# Patient Record
Sex: Male | Born: 1965 | Race: White | Hispanic: No | Marital: Married | Smoking: Never smoker
Health system: Southern US, Community
[De-identification: ages and names within clinical notes are randomized; demographics above are authoritative.]

## PROBLEM LIST (undated history)

## (undated) DIAGNOSIS — I4891 Unspecified atrial fibrillation: Secondary | ICD-10-CM

## (undated) DIAGNOSIS — I10 Essential (primary) hypertension: Secondary | ICD-10-CM

## (undated) DIAGNOSIS — E039 Hypothyroidism, unspecified: Secondary | ICD-10-CM

## (undated) DIAGNOSIS — Z8719 Personal history of other diseases of the digestive system: Secondary | ICD-10-CM

## (undated) DIAGNOSIS — K2900 Acute gastritis without bleeding: Secondary | ICD-10-CM

## (undated) DIAGNOSIS — R0600 Dyspnea, unspecified: Secondary | ICD-10-CM

## (undated) DIAGNOSIS — K921 Melena: Secondary | ICD-10-CM

## (undated) DIAGNOSIS — E079 Disorder of thyroid, unspecified: Secondary | ICD-10-CM

## (undated) DIAGNOSIS — D649 Anemia, unspecified: Secondary | ICD-10-CM

## (undated) DIAGNOSIS — K219 Gastro-esophageal reflux disease without esophagitis: Secondary | ICD-10-CM

## (undated) DIAGNOSIS — Z8489 Family history of other specified conditions: Secondary | ICD-10-CM

## (undated) HISTORY — DX: Acute gastritis without bleeding: K29.00

## (undated) HISTORY — DX: Anemia, unspecified: D64.9

## (undated) HISTORY — DX: Essential (primary) hypertension: I10

## (undated) HISTORY — PX: HERNIA REPAIR: SHX51

## (undated) HISTORY — DX: Gastro-esophageal reflux disease without esophagitis: K21.9

## (undated) HISTORY — DX: Unspecified atrial fibrillation: I48.91

## (undated) HISTORY — DX: Disorder of thyroid, unspecified: E07.9

## (undated) HISTORY — DX: Melena: K92.1

---

## 2018-09-21 ENCOUNTER — Emergency Department: Payer: Self-pay

## 2018-09-21 ENCOUNTER — Inpatient Hospital Stay
Admission: EM | Admit: 2018-09-21 | Discharge: 2018-09-21 | DRG: 310 | Disposition: A | Discharge status: Home / Self Care | Payer: Self-pay | Attending: Internal Medicine | Admitting: Internal Medicine

## 2018-09-21 DIAGNOSIS — I4819 Other persistent atrial fibrillation: Secondary | ICD-10-CM | POA: Diagnosis present

## 2018-09-21 DIAGNOSIS — I499 Cardiac arrhythmia, unspecified: Principal | ICD-10-CM | POA: Diagnosis present

## 2018-09-21 DIAGNOSIS — E039 Hypothyroidism, unspecified: Secondary | ICD-10-CM | POA: Diagnosis present

## 2018-09-21 DIAGNOSIS — R000 Tachycardia, unspecified: Secondary | ICD-10-CM | POA: Diagnosis present

## 2018-09-21 DIAGNOSIS — I4891 Unspecified atrial fibrillation: Secondary | ICD-10-CM | POA: Diagnosis present

## 2018-09-21 DIAGNOSIS — D649 Anemia, unspecified: Secondary | ICD-10-CM | POA: Diagnosis present

## 2018-09-21 DIAGNOSIS — I10 Essential (primary) hypertension: Secondary | ICD-10-CM | POA: Diagnosis present

## 2018-09-21 DIAGNOSIS — Z7982 Long term (current) use of aspirin: Secondary | ICD-10-CM

## 2018-09-21 LAB — HEPATIC FUNCTION PANEL
ALK PHOS: 45 U/L (ref 38–126)
ALT: 26 U/L (ref 0–44)
AST: 32 U/L (ref 15–41)
Albumin: 3.4 g/dL — ABNORMAL LOW (ref 3.5–5.0)
BILIRUBIN DIRECT: 0.3 mg/dL — AB (ref 0.0–0.2)
BILIRUBIN TOTAL: 1.6 mg/dL — AB (ref 0.3–1.2)
Indirect Bilirubin: 1.3 mg/dL — ABNORMAL HIGH (ref 0.3–0.9)
Total Protein: 6.9 g/dL (ref 6.5–8.1)

## 2018-09-21 LAB — CBC
HCT: 34.4 % — ABNORMAL LOW (ref 39.0–52.0)
HEMOGLOBIN: 9 g/dL — AB (ref 13.0–17.0)
MCH: 16.4 pg — AB (ref 26.0–34.0)
MCHC: 26.2 g/dL — AB (ref 30.0–36.0)
MCV: 62.8 fL — ABNORMAL LOW (ref 80.0–100.0)
Platelets: 291 10*3/uL (ref 150–400)
RBC: 5.48 MIL/uL (ref 4.22–5.81)
RDW: 19.8 % — AB (ref 11.5–15.5)
WBC: 7.5 10*3/uL (ref 4.0–10.5)
nRBC: 0 % (ref 0.0–0.2)

## 2018-09-21 LAB — BASIC METABOLIC PANEL
Anion gap: 8 (ref 5–15)
BUN: 8 mg/dL (ref 6–20)
CHLORIDE: 103 mmol/L (ref 98–111)
CO2: 23 mmol/L (ref 22–32)
CREATININE: 0.87 mg/dL (ref 0.61–1.24)
Calcium: 8 mg/dL — ABNORMAL LOW (ref 8.9–10.3)
GFR calc Af Amer: >60 mL/min (ref >60)
GFR calc non Af Amer: >60 mL/min (ref >60)
GLUCOSE: 106 mg/dL — AB (ref 70–99)
Potassium: 3.6 mmol/L (ref 3.5–5.1)
Sodium: 134 mmol/L — ABNORMAL LOW (ref 135–145)

## 2018-09-21 LAB — TROPONIN I: Troponin I: <0.03 ng/mL (ref <0.03)

## 2018-09-21 LAB — TSH: TSH: 3.515 u[IU]/mL (ref 0.350–4.500)

## 2018-09-21 LAB — BRAIN NATRIURETIC PEPTIDE: B NATRIURETIC PEPTIDE 5: 267 pg/mL — AB (ref 0.0–100.0)

## 2018-09-21 MED ORDER — LORAZEPAM 1 MG PO TABS: 1.0000 mg | ORAL_TABLET | Freq: Four times a day (QID) | ORAL | Status: DC | PRN

## 2018-09-21 MED ORDER — ADULT MULTIVITAMIN W/MINERALS CH: 1.0000 | ORAL_TABLET | Freq: Every day | ORAL | Status: DC

## 2018-09-21 MED ORDER — THIAMINE HCL 100 MG/ML IJ SOLN: 100.0000 mg | Freq: Every day | INTRAMUSCULAR | Status: DC

## 2018-09-21 MED ORDER — ASPIRIN EC 81 MG PO TBEC: 81.0000 mg | DELAYED_RELEASE_TABLET | Freq: Every day | ORAL | 2 refills | Status: DC

## 2018-09-21 MED ORDER — METOPROLOL TARTRATE 25 MG PO TABS: 25.0000 mg | ORAL_TABLET | Freq: Two times a day (BID) | ORAL | Status: DC

## 2018-09-21 MED ORDER — ASPIRIN 81 MG PO CHEW
324.0000 mg | CHEWABLE_TABLET | Freq: Once | ORAL | Status: AC
  Administered 2018-09-21: 324 mg via ORAL
  Filled 2018-09-21: qty 4

## 2018-09-21 MED ORDER — VITAMIN B-1 100 MG PO TABS: 100.0000 mg | ORAL_TABLET | Freq: Every day | ORAL | Status: DC

## 2018-09-21 MED ORDER — FOLIC ACID 1 MG PO TABS: 1.0000 mg | ORAL_TABLET | Freq: Every day | ORAL | Status: DC

## 2018-09-21 MED ORDER — LORAZEPAM 2 MG PO TABS: 0.0000 mg | ORAL_TABLET | Freq: Two times a day (BID) | ORAL | Status: DC

## 2018-09-21 MED ORDER — METOPROLOL TARTRATE 5 MG/5ML IV SOLN
5.0000 mg | Freq: Once | INTRAVENOUS | Status: AC
  Administered 2018-09-21: 5 mg via INTRAVENOUS
  Filled 2018-09-21: qty 5

## 2018-09-21 MED ORDER — LORAZEPAM 2 MG/ML IJ SOLN: 1.0000 mg | Freq: Four times a day (QID) | INTRAMUSCULAR | Status: DC | PRN

## 2018-09-21 MED ORDER — LORAZEPAM 2 MG PO TABS: 0.0000 mg | ORAL_TABLET | Freq: Four times a day (QID) | ORAL | Status: DC

## 2018-09-21 NOTE — ED Provider Notes (Signed)
[facility] Emergency Department Provider Note  ____________________________________________   I have reviewed the triage vital signs and the nursing notes. Where available I have reviewed prior notes and, if possible and indicated, outside hospital notes.    HISTORY  Chief Complaint Chest Pain    HPI Dean Anderson is a 52 y.o. male who states he has not seen a doctor in 10 years, he states his baseline blood pressure as far as he knows is around 170, he states that he gets chest pain "sometimes" off and on.  He cannot really attribute any particular activity to it.  He also states that he sometimes has shortness of breath but that is chronic.  He states that this when he became sweaty was having some chest discomfort so he called 911, EMS found him to be in a narrow complex rapid rate, they tried adenosine, as it was narrow complex but irregular, after he broke he was clearly and what appeared to be atrial fibrillation so the given 2.5 of metoprolol brought him in.  Heart rate was initially quite fast.  Patient states that the adenosine did not make him feel better but the metoprolol did.  He states he has no chest pain or shortness of breath at this time.  States he was not having very much chest pain at the time it was just a discomfort.  Patient is some of a limited historian.  Aside from likely undiagnosed hypertension patient has a history of alcohol ingestion, he states he drinks 8 beers a day. No known history of withdrawal  History reviewed. No pertinent past medical history.  There are no active problems to display for this patient.   History reviewed. No pertinent surgical history.  Prior to Admission medications   Not on File    Allergies Patient has no known allergies.  No family history on file.  Social History Social History   Tobacco Use  . Smoking status: Never Smoker  . Smokeless tobacco: Never Used  Substance Use Topics  . Alcohol  use: Yes  . Drug use: Not on file    Review of Systems Constitutional: No fever/chills Eyes: No visual changes. ENT: No sore throat. No stiff neck no neck pain Cardiovascular: See HPI chest pain. Respiratory: See HPI shortness of breath. Gastrointestinal:   no vomiting.  No diarrhea.  No constipation. Genitourinary: Negative for dysuria. Musculoskeletal: Negative lower extremity swelling Skin: Negative for rash. Neurological: Negative for severe headaches, focal weakness or numbness.   ____________________________________________   PHYSICAL EXAM:  VITAL SIGNS: ED Triage Vitals  Enc Vitals Group     BP 09/21/18 1025 (!) 173/105     Pulse Rate 09/21/18 1023 80     Resp 09/21/18 1023 18     Temp 09/21/18 1025 97.8 F (36.6 C)     Temp Source 09/21/18 1025 Oral     SpO2 09/21/18 1025 100 %     Weight 09/21/18 1023 200 lb (90.7 kg)     Height 09/21/18 1023 5\' 8"  (1.727 m)     Head Circumference --      Peak Flow --      Pain Score --      Pain Loc --      Pain Edu? --      Excl. in GC? --     Constitutional: Alert and oriented. Well appearing and in no acute distress. Eyes: Conjunctivae are normal Head: Atraumatic HEENT: No congestion/rhinnorhea. Mucous membranes are moist.  Oropharynx non-erythematous  Neck:   Nontender with no meningismus, no masses, no stridor Cardiovascular: Irregularly irregular rate 110 rhythm. Grossly normal heart sounds.  Good peripheral circulation. Respiratory: Normal respiratory effort.  No retractions. Lungs CTAB. Abdominal: Soft and nontender. No distention. No guarding no rebound Back:  There is no focal tenderness or step off.  there is no midline tenderness there are no lesions noted. there is no CVA tenderness Musculoskeletal: No lower extremity tenderness, no upper extremity tenderness. No joint effusions, no DVT signs strong distal pulses no edema Neurologic:  Normal speech and language. No gross focal neurologic deficits are  appreciated.  Skin:  Skin is warm, dry and intact. No rash noted. Psychiatric: Mood and affect are normal. Speech and behavior are normal.  ____________________________________________   LABS (all labs ordered are listed, but only abnormal results are displayed)  Labs Reviewed  BASIC METABOLIC PANEL - Abnormal; Notable for the following components:      Result Value   Sodium 134 (*)    Glucose, Bld 106 (*)    Calcium 8.0 (*)    All other components within normal limits  CBC - Abnormal; Notable for the following components:   Hemoglobin 9.0 (*)    HCT 34.4 (*)    MCV 62.8 (*)    MCH 16.4 (*)    MCHC 26.2 (*)    RDW 19.8 (*)    All other components within normal limits  BRAIN NATRIURETIC PEPTIDE - Abnormal; Notable for the following components:   B Natriuretic Peptide 267.0 (*)    All other components within normal limits  TROPONIN I  TSH    Pertinent labs  results that were available during my care of the patient were reviewed by me and considered in my medical decision making (see chart for details). ____________________________________________  EKG  I personally interpreted any EKGs ordered by me or triage Atrial fibrillation rate 104, RSR prime configuration no acute ST elevation or depression, T waves noted inferiorly, no old for comparison ____________________________________________  RADIOLOGY  Pertinent labs & imaging results that were available during my care of the patient were reviewed by me and considered in my medical decision making (see chart for details). If possible, patient and/or family made aware of any abnormal findings.  Dg Chest 2 View  Result Date: 09/21/2018 CLINICAL DATA:  Chest pain, diaphoresis, tachycardia. No cardiopulmonary history. EXAM: CHEST - 2 VIEW COMPARISON:  None in PACs FINDINGS: The lungs are adequately inflated. There are coarse linear lung markings in the right middle lobe anteriorly. The cardiac silhouette is top-normal in size.  The pulmonary vascularity is normal. There is an air-fluid level in a hiatal hernia. The mediastinum is normal in width. IMPRESSION: Subsegmental atelectasis or scarring in the right middle lobe. No alveolar pneumonia nor pulmonary edema. Top-normal cardiac size without pulmonary vascular congestion. Moderate-sized hiatal hernia. Electronically Signed   By: David  [place] M.D.   On: 09/21/2018 11:10   ____________________________________________    PROCEDURES  Procedure(s) performed: None  Procedures  Critical Care performed: CRITICAL CARE Performed by: Jeanmarie Plant   Total critical care time: 45 minutes  Critical care time was exclusive of separately billable procedures and treating other patients.  Critical care was necessary to treat or prevent imminent or life-threatening deterioration.  Critical care was time spent personally by me on the following activities: development of treatment plan with patient and/or surrogate as well as nursing, discussions with consultants, evaluation of patient's response to treatment, examination of patient, obtaining history from  patient or surrogate, ordering and performing treatments and interventions, ordering and review of laboratory studies, ordering and review of radiographic studies, pulse oximetry and re-evaluation of patient's condition.   ____________________________________________   INITIAL IMPRESSION / ASSESSMENT AND PLAN / ED COURSE  Pertinent labs & imaging results that were available during my care of the patient were reviewed by me and considered in my medical decision making (see chart for details).  Here with a heart rate that appeared to been in the 180s or higher per EMS, improved after metoprolol.  His first medical sinus notes this is new onset atrial fibrillation although given that he has not seen a doctor in 10 years is quite possible that this is been going off and on for some time.  However he has never felt unstable or  sweating with it as he did this morning and he did have some associated chest discomfort.  Initial work-up is reassuring in terms of blood work, he is somewhat anemic we have no way to compare that but no recent history of GI bleed.  NP is elevated, patient will need an echo, TSH is reassuring, troponin is reassuring.  Patient's heart rate began to creep up to the 120s and 130s again I gave him 5 more of metoprolol we will admit him.    ____________________________________________   FINAL CLINICAL IMPRESSION(S) / ED DIAGNOSES  Final diagnoses:  Atrial fibrillation with RVR (HCC)      This chart was dictated using voice recognition software.  Despite best efforts to proofread,  errors can occur which can change meaning.      Jeanmarie Plant, MD 09/21/18 1215

## 2018-09-21 NOTE — Consult Note (Signed)
[facility] - [place] at [facility]   PATIENT NAME: Dean Anderson    MR#:  [id number]  DATE OF BIRTH:  1966-11-15  DATE OF ADMISSION:  09/21/2018  PRIMARY CARE PHYSICIAN: Patient, No Pcp Per   REQUESTING/REFERRING PHYSICIAN: Dr Alphonzo Lemmings  CHIEF COMPLAINT:      Chief Complaint  Patient presents with  . Chest Pain    HISTORY OF PRESENT ILLNESS:  Dean Anderson  is a 52 y.o. male with no past medical history comes to the emergency room after he felt dizzy and lightheaded and overall not feeling well when he went to work. People from work called the fire department he was put on the monitor and was found to be tachycardic/SVT/a fib. He received two doses of adeno seen by EMTs. Patient's heart rate slowed down however he went into a fib again and minimum of IV metoprolol slowed him down. Blood pressure was elevated when patient presented to the emergency room. Currently during my evaluation patient's heart rate is 72-80 sinus rhythm blood pressure is 148/90 he feels fine. No chest pain or shortness of breath.  Patient states he drinks about 7 to 8 cans of beer every day. No history of DTs or alcohol-related health issues.  Was seen in the emergency room by Dr. Gwen Pounds. PAST MEDICAL HISTORY:  History reviewed. No pertinent past medical history.  PAST SURGICAL HISTOIRY:  History reviewed. No pertinent surgical history.  SOCIAL HISTORY:   Social History       Tobacco Use  . Smoking status: Never Smoker  . Smokeless tobacco: Never Used  Substance Use Topics  . Alcohol use: Yes    FAMILY HISTORY:  No family history on file.  DRUG ALLERGIES:  No Known Allergies  REVIEW OF SYSTEMS:   Review of Systems  Constitutional: Negative for chills, fever and weight loss.  HENT: Negative for ear discharge, ear pain and nosebleeds.   Eyes: Negative for blurred vision, pain and discharge.  Respiratory: Negative for sputum production,  shortness of breath, wheezing and stridor.   Cardiovascular: Negative for chest pain, palpitations, orthopnea and PND.  Gastrointestinal: Negative for abdominal pain, diarrhea, nausea and vomiting.  Genitourinary: Negative for frequency and urgency.  Musculoskeletal: Negative for back pain and joint pain.  Neurological: Negative for sensory change, speech change, focal weakness and weakness.  Psychiatric/Behavioral: Negative for depression and hallucinations. The patient is not nervous/anxious.     CONSTITUTIONAL: No fever, fatigue or weakness.  EYES: No blurred or double vision.  EARS, NOSE, AND THROAT: No tinnitus or ear pain.  RESPIRATORY: No cough, shortness of breath, wheezing or hemoptysis.  CARDIOVASCULAR: No chest pain, orthopnea, edema.  GASTROINTESTINAL: No nausea, vomiting, diarrhea or abdominal pain.  GENITOURINARY: No dysuria, hematuria.  ENDOCRINE: No polyuria, nocturia,  HEMATOLOGY: No anemia, easy bruising or bleeding SKIN: No rash or lesion. MUSCULOSKELETAL: No joint pain or arthritis.   NEUROLOGIC: No tingling, numbness, weakness.  PSYCHIATRY: No anxiety or depression.   MEDICATIONS AT HOME:          Prior to Admission medications   Medication Sig Start Date End Date Taking? Authorizing Provider  aspirin EC 81 MG tablet Take 1 tablet (81 mg total) by mouth daily. 09/21/18 09/21/19  Enedina Finner, MD      VITAL SIGNS:  Blood pressure (!) 150/101, pulse 68, temperature 97.8 F (36.6 C), temperature source Oral, resp. rate 19, height 5\' 8"  (1.727 m), weight 90.7 kg, SpO2 100 %.  PHYSICAL EXAMINATION:  GENERAL:  52  y.o.-year-old patient lying in the bed with no acute distress.  EYES: Pupils equal, round, reactive to light and accommodation. No scleral icterus. Extraocular muscles intact.  HEENT: Head atraumatic, normocephalic. Oropharynx and nasopharynx clear.  NECK:  Supple, no jugular venous distention. No thyroid enlargement, no tenderness.  LUNGS:  Normal breath sounds bilaterally, no wheezing, rales,rhonchi or crepitation. No use of accessory muscles of respiration.  CARDIOVASCULAR: S1, S2 normal. No murmurs, rubs, or gallops.  ABDOMEN: Soft, nontender, nondistended. Bowel sounds present. No organomegaly or mass.  EXTREMITIES: No pedal edema, cyanosis, or clubbing.  NEUROLOGIC: Cranial nerves II through XII are intact. Muscle strength 5/5 in all extremities. Sensation intact. Gait not checked.  PSYCHIATRIC: The patient is alert and oriented x 3.  SKIN: No obvious rash, lesion, or ulcer.   LABORATORY PANEL:   CBC LastLabs     Recent Labs  Lab 09/21/18 1026  WBC 7.5  HGB 9.0*  HCT 34.4*  PLT 291     ------------------------------------------------------------------------------------------------------------------  Chemistries  LastLabs     Recent Labs  Lab 09/21/18 1026  NA 134*  K 3.6  CL 103  CO2 23  GLUCOSE 106*  BUN 8  CREATININE 0.87  CALCIUM 8.0*  AST 32  ALT 26  ALKPHOS 45  BILITOT 1.6*     ------------------------------------------------------------------------------------------------------------------  Cardiac Enzymes LastLabs  Recent Labs  Lab 09/21/18 1026  TROPONINI <0.03     ------------------------------------------------------------------------------------------------------------------  RADIOLOGY:   ImagingResults(Last48hours)  Dg Chest 2 View  Result Date: 09/21/2018 CLINICAL DATA:  Chest pain, diaphoresis, tachycardia. No cardiopulmonary history. EXAM: CHEST - 2 VIEW COMPARISON:  None in PACs FINDINGS: The lungs are adequately inflated. There are coarse linear lung markings in the right middle lobe anteriorly. The cardiac silhouette is top-normal in size. The pulmonary vascularity is normal. There is an air-fluid level in a hiatal hernia. The mediastinum is normal in width. IMPRESSION: Subsegmental atelectasis or scarring in the right middle lobe. No alveolar  pneumonia nor pulmonary edema. Top-normal cardiac size without pulmonary vascular congestion. Moderate-sized hiatal hernia. Electronically Signed   By: David  [place] M.D.   On: 09/21/2018 11:10     EKG:      Orders placed or performed during the hospital encounter of 09/21/18  . ED EKG within 10 minutes  . ED EKG within 10 minutes  . EKG 12-Lead  . EKG 12-Lead  . EKG 12-Lead  . EKG 12-Lead    IMPRESSION AND PLAN:   Yeshua Stryker  is a 52 y.o. male with no past medical history comes to the emergency room after he felt dizzy and lightheaded and overall not feeling well when he went to work. People from work called the fire department he was put on the monitor and was found to be tachycardic/SVT/a fib.   1. cardiac arrhythmia patient presented with SVT/a fib. Received two doses of IV Adenosine and a dose of IV metoprolol -in the 70s -he is sinus rhythm. Blood pressure is much improved. -Will place them on oral metoprolol 25 mg BID and aspirin 81 mg daily -patient was seen by Dr. Gwen Pounds cardiology. Okay from his standpoint to go home. Discussed with patient he is agreeable to it  2. malignant hypertension continue metoprolol  3. DVT prophylaxis patient is ambulatory  Discussed discharge plan with patient. He will follow-up with Dr. Gwen Pounds as outpatient. For the workup from cardiology standpoint including echo will be done as outpatient.  All the records are reviewed and case discussed with Consulting provider. Management plans  discussed with the patient, family and they are in agreement.  CODE STATUS: full  TOTAL TIME TAKING CARE OF THIS PATIENT: 50 minutes.    Enedina Finner M.D on 09/21/2018 at 1:26 PM  Between 7am to 6pm - Pager - [phone]  After 6pm go to [email or web address] - password [email or web address]  [facility]  Office  [phone]  CC: Primary care Physician: Patient, No Pcp Per            Routing History

## 2018-09-21 NOTE — ED Notes (Signed)
Pt reports drinking approx 8 beers a day and his last beer was this morning

## 2018-09-21 NOTE — ED Triage Notes (Addendum)
Pt came to ED via EMS c/o chest pain Starting at 530 this morning. Pt reports it was more of a "gas" feeling. Pt reports was sweating and not feeling right. Upon EMS arrival, pt HR 180. Given 6mg  and 12mg  adenosine with no change. Given 2.5 metoporol. Pt HR 103 on arrival

## 2018-09-21 NOTE — Consult Note (Signed)
[facility] Cardiology Consultation Note  Patient ID: Dean Anderson, MRN: [id number], DOB/AGE: 1966/04/09 52 y.o. Admit date: 09/21/2018   Date of Consult: 09/21/2018 Primary Physician: Patient, No Pcp Per Primary Cardiologist: None  Chief Complaint:  Chief Complaint  Patient presents with  . Chest Pain   Reason for Consult: Atrial fibrillation with rapid ventricular rate  HPI: 52 y.o. male with no apparent significant previous cardiovascular history who has had significant amount of tobacco and alcohol use in the past.  Patient has had new onset of palpitations irregular heartbeat and not feeling well.  When seen in the emergency room he had atrial fibrillation with rapid ventricular rate but no other ST changes.  The patient was receiving appropriate medication management including intravenous beta-blocker and converted to normal sinus rhythm without event.  Currently the patient feels well with no further significant symptoms.  He does have a BNP of 267 and normal troponin anemia with a hemoglobin of 9 and a TSH of 3.5.  All of which could have contributed to some of the atrial fibrillation that he has had.  There is been no evidence of heart attack or heart failure type symptoms.  The patient may be able to have further medication management and further treatment options as an outpatient  History reviewed. No pertinent past medical history.    Surgical History: History reviewed. No pertinent surgical history.   Home Meds: Prior to Admission medications   Medication Sig Start Date End Date Taking? Authorizing Provider  aspirin EC 81 MG tablet Take 1 tablet (81 mg total) by mouth daily. 09/21/18 09/21/19  Enedina Finner, MD    Inpatient Medications:  . metoprolol tartrate  25 mg Oral BID  . thiamine  100 mg Oral Daily     Allergies: No Known Allergies  Social History   Socioeconomic History  . Marital status: Married    Spouse name: Not on file  . Number of children: Not on  file  . Years of education: Not on file  . Highest education level: Not on file  Occupational History  . Not on file  Social Needs  . Financial resource strain: Not on file  . Food insecurity:    Worry: Not on file    Inability: Not on file  . Transportation needs:    Medical: Not on file    Non-medical: Not on file  Tobacco Use  . Smoking status: Never Smoker  . Smokeless tobacco: Never Used  Substance and Sexual Activity  . Alcohol use: Yes  . Drug use: Not on file  . Sexual activity: Not on file  Lifestyle  . Physical activity:    Days per week: Not on file    Minutes per session: Not on file  . Stress: Not on file  Relationships  . Social connections:    Talks on phone: Not on file    Gets together: Not on file    Attends religious service: Not on file    Active member of club or organization: Not on file    Attends meetings of clubs or organizations: Not on file    Relationship status: Not on file  . Intimate partner violence:    Fear of current or ex partner: Not on file    Emotionally abused: Not on file    Physically abused: Not on file    Forced sexual activity: Not on file  Other Topics Concern  . Not on file  Social History Narrative  . Not  on file     No family history on file.   Review of Systems Positive for shortness of breath palpitations Negative for: General:  chills, fever, night sweats or weight changes.  Cardiovascular: PND orthopnea syncope dizziness  Dermatological skin lesions rashes Respiratory: Cough congestion Urologic: Frequent urination urination at night and hematuria Abdominal: negative for nausea, vomiting, diarrhea, bright red blood per rectum, melena, or hematemesis Neurologic: negative for visual changes, and/or hearing changes  All other systems reviewed and are otherwise negative except as noted above.  Labs: Recent Labs    09/21/18 1026  TROPONINI <0.03   Lab Results  Component Value Date   WBC 7.5 09/21/2018    HGB 9.0 (L) 09/21/2018   HCT 34.4 (L) 09/21/2018   MCV 62.8 (L) 09/21/2018   PLT 291 09/21/2018    Recent Labs  Lab 09/21/18 1026  NA 134*  K 3.6  CL 103  CO2 23  BUN 8  CREATININE 0.87  CALCIUM 8.0*  PROT 6.9  BILITOT 1.6*  ALKPHOS 45  ALT 26  AST 32  GLUCOSE 106*   No results found for: CHOL, HDL, LDLCALC, TRIG No results found for: DDIMER  Radiology/Studies:  Dg Chest 2 View  Result Date: 09/21/2018 CLINICAL DATA:  Chest pain, diaphoresis, tachycardia. No cardiopulmonary history. EXAM: CHEST - 2 VIEW COMPARISON:  None in PACs FINDINGS: The lungs are adequately inflated. There are coarse linear lung markings in the right middle lobe anteriorly. The cardiac silhouette is top-normal in size. The pulmonary vascularity is normal. There is an air-fluid level in a hiatal hernia. The mediastinum is normal in width. IMPRESSION: Subsegmental atelectasis or scarring in the right middle lobe. No alveolar pneumonia nor pulmonary edema. Top-normal cardiac size without pulmonary vascular congestion. Moderate-sized hiatal hernia. Electronically Signed   By: David  [place] M.D.   On: 09/21/2018 11:10    EKG: Atrial fibrillation with rapid ventricular rate  Weights: Filed Weights   09/21/18 1023  Weight: 90.7 kg     Physical Exam: Blood pressure (!) 148/90, pulse 74, temperature 97.8 F (36.6 C), temperature source Oral, resp. rate 19, height 5\' 8"  (1.727 m), weight 90.7 kg, SpO2 100 %. Body mass index is 30.41 kg/m. General: Well developed, well nourished, in no acute distress. Head eyes ears nose throat: Normocephalic, atraumatic, sclera non-icteric, no xanthomas, nares are without discharge. No apparent thyromegaly and/or mass  Lungs: Normal respiratory effort.  no wheezes, no rales, no rhonchi.  Heart: RRR with normal S1 S2. no murmur gallop, no rub, PMI is normal size and placement, carotid upstroke normal without bruit, jugular venous pressure is normal Abdomen: Soft,  non-tender, non-distended with normoactive bowel sounds. No hepatomegaly. No rebound/guarding. No obvious abdominal masses. Abdominal aorta is normal size without bruit Extremities: No edema. no cyanosis, no clubbing, no ulcers  Peripheral : 2+ bilateral upper extremity pulses, 2+ bilateral femoral pulses, 2+ bilateral dorsal pedal pulse Neuro: Alert and oriented. No facial asymmetry. No focal deficit. Moves all extremities spontaneously. Musculoskeletal: Normal muscle tone without kyphosis Psych:  Responds to questions appropriately with a normal affect.    Assessment: 52 year old male with tobacco alcohol use with slightly hypothyroid and anemic and having acute onset of atrial fibrillation with rapid ventricular rate without evidence of myocardial infarction or congestive heart failure now converted into normal  Plan: 1.  Continue beta-blocker and use oral beta-blocker for heart rate control and spontaneous maintenance of normal sinus rhythm 2.  No further cardiac work-up or treatment due to no  evidence of myocardial infarction and/or congestive heart failure 3.  Patient was counseled on discontinuation of alcohol and tobacco use 4.  Follow-up for further evaluation and treatment options for above  Signed, Lamar Blinks M.D. [facility] Cardiology 09/21/2018, 2:10 PM

## 2018-09-21 NOTE — Discharge Summary (Deleted)
[facility] [place] [facility]   PATIENT NAME: Bejamin Anderson    MR#:  [id number]  DATE OF BIRTH:  1966-02-07  DATE OF ADMISSION:  09/21/2018  PRIMARY CARE PHYSICIAN: Patient, No Pcp Per   REQUESTING/REFERRING PHYSICIAN: Dr Alphonzo Lemmings  CHIEF COMPLAINT:   Chief Complaint  Patient presents with  . Chest Pain    HISTORY OF PRESENT ILLNESS:  Dean Anderson  is a 52 y.o. male with no past medical history comes to the emergency room after he felt dizzy and lightheaded and overall not feeling well when he went to work. People from work called the fire department he was put on the monitor and was found to be tachycardic/SVT/a fib. He received two doses of adeno seen by EMTs. Patient's heart rate slowed down however he went into a fib again and minimum of IV metoprolol slowed him down. Blood pressure was elevated when patient presented to the emergency room. Currently during my evaluation patient's heart rate is 72-80 sinus rhythm blood pressure is 148/90 he feels fine. No chest pain or shortness of breath.  Patient states he drinks about 7 to 8 cans of beer every day. No history of DTs or alcohol-related health issues.  Was seen in the emergency room by Dr. Gwen Pounds. PAST MEDICAL HISTORY:  History reviewed. No pertinent past medical history.  PAST SURGICAL HISTOIRY:  History reviewed. No pertinent surgical history.  SOCIAL HISTORY:   Social History   Tobacco Use  . Smoking status: Never Smoker  . Smokeless tobacco: Never Used  Substance Use Topics  . Alcohol use: Yes    FAMILY HISTORY:  No family history on file.  DRUG ALLERGIES:  No Known Allergies  REVIEW OF SYSTEMS:   Review of Systems  Constitutional: Negative for chills, fever and weight loss.  HENT: Negative for ear discharge, ear pain and nosebleeds.   Eyes: Negative for blurred vision, pain and discharge.  Respiratory: Negative for sputum production, shortness of breath, wheezing and  stridor.   Cardiovascular: Negative for chest pain, palpitations, orthopnea and PND.  Gastrointestinal: Negative for abdominal pain, diarrhea, nausea and vomiting.  Genitourinary: Negative for frequency and urgency.  Musculoskeletal: Negative for back pain and joint pain.  Neurological: Negative for sensory change, speech change, focal weakness and weakness.  Psychiatric/Behavioral: Negative for depression and hallucinations. The patient is not nervous/anxious.     CONSTITUTIONAL: No fever, fatigue or weakness.  EYES: No blurred or double vision.  EARS, NOSE, AND THROAT: No tinnitus or ear pain.  RESPIRATORY: No cough, shortness of breath, wheezing or hemoptysis.  CARDIOVASCULAR: No chest pain, orthopnea, edema.  GASTROINTESTINAL: No nausea, vomiting, diarrhea or abdominal pain.  GENITOURINARY: No dysuria, hematuria.  ENDOCRINE: No polyuria, nocturia,  HEMATOLOGY: No anemia, easy bruising or bleeding SKIN: No rash or lesion. MUSCULOSKELETAL: No joint pain or arthritis.   NEUROLOGIC: No tingling, numbness, weakness.  PSYCHIATRY: No anxiety or depression.   MEDICATIONS AT HOME:   Prior to Admission medications   Medication Sig Start Date End Date Taking? Authorizing Provider  aspirin EC 81 MG tablet Take 1 tablet (81 mg total) by mouth daily. 09/21/18 09/21/19  Enedina Finner, MD      VITAL SIGNS:  Blood pressure (!) 150/101, pulse 68, temperature 97.8 F (36.6 C), temperature source Oral, resp. rate 19, height 5\' 8"  (1.727 m), weight 90.7 kg, SpO2 100 %.  PHYSICAL EXAMINATION:  GENERAL:  52 y.o.-year-old patient lying in the bed with no acute distress.  EYES: Pupils equal,  round, reactive to light and accommodation. No scleral icterus. Extraocular muscles intact.  HEENT: Head atraumatic, normocephalic. Oropharynx and nasopharynx clear.  NECK:  Supple, no jugular venous distention. No thyroid enlargement, no tenderness.  LUNGS: Normal breath sounds bilaterally, no wheezing,  rales,rhonchi or crepitation. No use of accessory muscles of respiration.  CARDIOVASCULAR: S1, S2 normal. No murmurs, rubs, or gallops.  ABDOMEN: Soft, nontender, nondistended. Bowel sounds present. No organomegaly or mass.  EXTREMITIES: No pedal edema, cyanosis, or clubbing.  NEUROLOGIC: Cranial nerves II through XII are intact. Muscle strength 5/5 in all extremities. Sensation intact. Gait not checked.  PSYCHIATRIC: The patient is alert and oriented x 3.  SKIN: No obvious rash, lesion, or ulcer.   LABORATORY PANEL:   CBC Recent Labs  Lab 09/21/18 1026  WBC 7.5  HGB 9.0*  HCT 34.4*  PLT 291   ------------------------------------------------------------------------------------------------------------------  Chemistries  Recent Labs  Lab 09/21/18 1026  NA 134*  K 3.6  CL 103  CO2 23  GLUCOSE 106*  BUN 8  CREATININE 0.87  CALCIUM 8.0*  AST 32  ALT 26  ALKPHOS 45  BILITOT 1.6*   ------------------------------------------------------------------------------------------------------------------  Cardiac Enzymes Recent Labs  Lab 09/21/18 1026  TROPONINI <0.03   ------------------------------------------------------------------------------------------------------------------  RADIOLOGY:  Dg Chest 2 View  Result Date: 09/21/2018 CLINICAL DATA:  Chest pain, diaphoresis, tachycardia. No cardiopulmonary history. EXAM: CHEST - 2 VIEW COMPARISON:  None in PACs FINDINGS: The lungs are adequately inflated. There are coarse linear lung markings in the right middle lobe anteriorly. The cardiac silhouette is top-normal in size. The pulmonary vascularity is normal. There is an air-fluid level in a hiatal hernia. The mediastinum is normal in width. IMPRESSION: Subsegmental atelectasis or scarring in the right middle lobe. No alveolar pneumonia nor pulmonary edema. Top-normal cardiac size without pulmonary vascular congestion. Moderate-sized hiatal hernia. Electronically Signed   By:  David  [place] M.D.   On: 09/21/2018 11:10    EKG:   Orders placed or performed during the hospital encounter of 09/21/18  . ED EKG within 10 minutes  . ED EKG within 10 minutes  . EKG 12-Lead  . EKG 12-Lead  . EKG 12-Lead  . EKG 12-Lead    IMPRESSION AND PLAN:   Ola Fawver  is a 52 y.o. male with no past medical history comes to the emergency room after he felt dizzy and lightheaded and overall not feeling well when he went to work. People from work called the fire department he was put on the monitor and was found to be tachycardic/SVT/a fib.   1. cardiac arrhythmia patient presented with SVT/a fib. Received two doses of IV Adenosine and a dose of IV metoprolol -in the 70s -he is sinus rhythm. Blood pressure is much improved. -Will place them on oral metoprolol 25 mg BID and aspirin 81 mg daily -patient was seen by Dr. Gwen Pounds cardiology. Okay from his standpoint to go home. Discussed with patient he is agreeable to it  2. malignant hypertension continue metoprolol  3. DVT prophylaxis patient is ambulatory  Discussed discharge plan with patient. He will follow-up with Dr. Gwen Pounds as outpatient. For the workup from cardiology standpoint including echo will be done as outpatient.  All the records are reviewed and case discussed with Consulting provider. Management plans discussed with the patient, family and they are in agreement.  CODE STATUS: full  TOTAL TIME TAKING CARE OF THIS PATIENT: 50 minutes.    Enedina Finner M.D on 09/21/2018 at 1:26 PM  Between 7am to  6pm - Pager - [phone]  After 6pm go to [email or web address] - password [email or web address]  [facility]  Office  [phone]  CC: Primary care Physician: Patient, No Pcp Per

## 2018-09-21 NOTE — Discharge Instructions (Signed)
Abstain from alcohol.

## 2018-10-02 DIAGNOSIS — I10 Essential (primary) hypertension: Secondary | ICD-10-CM | POA: Insufficient documentation

## 2018-10-02 DIAGNOSIS — R0602 Shortness of breath: Secondary | ICD-10-CM | POA: Insufficient documentation

## 2018-10-02 DIAGNOSIS — R0609 Other forms of dyspnea: Secondary | ICD-10-CM | POA: Insufficient documentation

## 2019-01-17 DIAGNOSIS — R002 Palpitations: Secondary | ICD-10-CM | POA: Insufficient documentation

## 2019-02-27 ENCOUNTER — Ambulatory Visit: Admit: 2019-02-27 | Payer: MEDICAID | Admitting: Internal Medicine

## 2019-02-27 SURGERY — CARDIOVERSION (CATH LAB)
Anesthesia: General

## 2019-03-22 DIAGNOSIS — Z8679 Personal history of other diseases of the circulatory system: Secondary | ICD-10-CM | POA: Diagnosis not present

## 2019-03-22 DIAGNOSIS — Z789 Other specified health status: Secondary | ICD-10-CM | POA: Diagnosis not present

## 2019-03-22 DIAGNOSIS — E039 Hypothyroidism, unspecified: Secondary | ICD-10-CM | POA: Diagnosis not present

## 2019-03-22 DIAGNOSIS — I10 Essential (primary) hypertension: Secondary | ICD-10-CM | POA: Diagnosis not present

## 2019-03-22 DIAGNOSIS — R220 Localized swelling, mass and lump, head: Secondary | ICD-10-CM | POA: Diagnosis not present

## 2019-03-22 DIAGNOSIS — D649 Anemia, unspecified: Secondary | ICD-10-CM | POA: Diagnosis not present

## 2019-03-26 DIAGNOSIS — Z1389 Encounter for screening for other disorder: Secondary | ICD-10-CM | POA: Diagnosis not present

## 2019-04-04 DIAGNOSIS — E663 Overweight: Secondary | ICD-10-CM | POA: Diagnosis not present

## 2019-04-04 DIAGNOSIS — J301 Allergic rhinitis due to pollen: Secondary | ICD-10-CM | POA: Diagnosis not present

## 2019-04-04 DIAGNOSIS — J029 Acute pharyngitis, unspecified: Secondary | ICD-10-CM | POA: Diagnosis not present

## 2019-04-04 DIAGNOSIS — G4733 Obstructive sleep apnea (adult) (pediatric): Secondary | ICD-10-CM | POA: Diagnosis not present

## 2019-04-05 ENCOUNTER — Ambulatory Visit (INDEPENDENT_AMBULATORY_CARE_PROVIDER_SITE_OTHER): Payer: Medicaid Other | Admitting: Gastroenterology

## 2019-04-05 DIAGNOSIS — K625 Hemorrhage of anus and rectum: Secondary | ICD-10-CM | POA: Diagnosis not present

## 2019-04-05 DIAGNOSIS — R748 Abnormal levels of other serum enzymes: Secondary | ICD-10-CM | POA: Diagnosis not present

## 2019-04-05 DIAGNOSIS — D509 Iron deficiency anemia, unspecified: Secondary | ICD-10-CM | POA: Diagnosis not present

## 2019-04-05 NOTE — H&P (View-Only) (Signed)
  Dean Anderson [address]  [address]  [place], [place] [address]  Main: [phone]  Fax: [phone]   Gastroenterology Consultation  Referring Provider:     Miles, Linda M, MD Primary Care Physician: Linda Miles Reason for Consultation:     Anemia, rectal bleeding        HPI:   Virtual Visit via Video Note  I connected with patient on 04/05/19 at  9:30 AM EDT by video ([email or web address]) and verified that I am speaking with the correct person using two identifiers.   I discussed the limitations, risks, security and privacy concerns of performing an evaluation and management service by video and the availability of in person appointments. I also discussed with the patient that there may be a patient responsible charge related to this service. The patient expressed understanding and agreed to proceed.  Location of the patient: Home Location of provider: Home Participating persons: Patient and provider only (Nursing staff checked in patient via phone but were not physically involved in the video interaction - see their notes)   History of Present Illness: Chief Complaint  Patient presents with  . New Patient (Initial Visit)    referral for anemia and screening colonoscopy    Dean Anderson is a 53 y.o. y/o male referred for consultation & management  by Dr. Linda Miles.  Patient reports intermittent history of bright red blood per rectum.  States happened very intermittently about twice last year and self resolved.  Occurred again in January of this year and then resolved with no further episodes in February.  Has reoccurred as of the last 2 to 3 weeks.  Reports seeing bright red blood in the toilet paper itself and the toilet bowl.  Sometimes it is just minute streaks in the stool, and sometimes the whole bowl is red.  Does not feel any skin tags or hemorrhoids when he wipes.  No nausea or vomiting.  No abdominal pain.  No dysphagia or heartburn.  No prior EGD or colonoscopy.   Was started on oral iron by primary care provider 2 weeks ago due to anemia and started noticing black stools since then.  No prior black stools before then.  As per primary care provider note, last CBC showed hemoglobin around 9.5.  Has low MCV on March blood work in care everywhere.  Past medical history: Paroxysmal A. fib, previously on Eliquis, has been off of it for over a month due to anemia.  Prior to Admission medications   Medication Sig Start Date End Date Taking? Authorizing Provider  aspirin EC 81 MG tablet Take 1 tablet (81 mg total) by mouth daily. 09/21/18 09/21/19  Patel, Sona, MD    No family history on file.   Social History   Tobacco Use  . Smoking status: Never Smoker  . Smokeless tobacco: Never Used  Substance Use Topics  . Alcohol use: Yes  . Drug use: Not on file    Allergies as of 04/05/2019  . (No Known Allergies)    Review of Systems:    All systems reviewed and negative except where noted in HPI.   Observations/Objective:  Labs: CBC    Component Value Date/Time   WBC 7.5 09/21/2018 1026   RBC 5.48 09/21/2018 1026   HGB 9.0 (L) 09/21/2018 1026   HCT 34.4 (L) 09/21/2018 1026   PLT 291 09/21/2018 1026   MCV 62.8 (L) 09/21/2018 1026   MCH 16.4 (L) 09/21/2018 1026   MCHC 26.2 (L) 09/21/2018 1026     RDW 19.8 (H) 09/21/2018 1026   CMP     Component Value Date/Time   NA 134 (L) 09/21/2018 1026   K 3.6 09/21/2018 1026   CL 103 09/21/2018 1026   CO2 23 09/21/2018 1026   GLUCOSE 106 (H) 09/21/2018 1026   BUN 8 09/21/2018 1026   CREATININE 0.87 09/21/2018 1026   CALCIUM 8.0 (L) 09/21/2018 1026   PROT 6.9 09/21/2018 1026   ALBUMIN 3.4 (L) 09/21/2018 1026   AST 32 09/21/2018 1026   ALT 26 09/21/2018 1026   ALKPHOS 45 09/21/2018 1026   BILITOT 1.6 (H) 09/21/2018 1026   GFRNONAA >60 09/21/2018 1026   GFRAA >60 09/21/2018 1026    Imaging Studies: No results found.  Assessment and Plan:   Dean Anderson is a 53 y.o. y/o male has been  referred for anemia and intermittent bright red blood per rectum  Assessment and Plan: Given that patient has never had endoscopic visualization, and has been having intermittent bright red blood per rectum for 1 to 2 years that has increased in frequency this year, and has significant anemia, endoscopic visualization is indicated at this time.  He is otherwise hemodynamically stable, and does not have any indication for emergent endoscopy or inpatient admission for the same.  Elective outpatient procedure however, is important to be done within the next few weeks as it would change his management significantly if a large lesion or malignancy is found.  However, given his history of paroxysmal A. fib with recent changes in medication, will obtain cardiac clearance from his cardiologist prior to the procedure.  continue follow-up with PCP for management of thyroid medications  We will also obtain right upper quadrant ultrasound given his daily alcohol use to evaluate for cirrhosis Liver enzymes were normal in March 2020, will repeat CMP and INR  Patient advised to abstain from alcohol use, and risks of developing cirrhosis, and comorbidities and increased mortality with daily alcohol use and development of cirrhosis discussed in detail and he verbalized understanding.  I have discussed alternative options, risks & benefits,  which include, but are not limited to, bleeding, infection, perforation,respiratory complication & drug reaction.  The patient agrees with this plan & written consent will be obtained.    The risks of undergoing further work-up in the setting of COVID-19 pandemic and possible exposures were discussed with the patient.  However, given his ongoing anemia and intermittent bright red blood per rectum, the benefits of diagnosis and further management outweigh the risks at this time.  The black stool only occurred after starting oral iron replacement.  He does not have any  abdominal pain, hematemesis, or nausea vomiting, to indicate that this is melena, or any signs of upper GI bleed.  Follow Up Instructions: EGD and colonoscopy for anemia in the next few weeks, once cardiac clearance obtained  Clinic follow-up in 3 to 4 weeks  I discussed the assessment and treatment plan with the patient. The patient was provided an opportunity to ask questions and all were answered. The patient agreed with the plan and demonstrated an understanding of the instructions.   The patient was advised to call back or seek an in-person evaluation if the symptoms worsen or if the condition fails to improve as anticipated.  I provided 30 minutes of face-to-face time via video software during this encounter.   Pasty Spillers, MD  Speech recognition software was used to dictate the above note.

## 2019-04-05 NOTE — Progress Notes (Signed)
Dean Anderson [address]  [address]  [place], [name]   Gastroenterology Consultation  Referring Provider:     Leanna Anderson, Dean M, MD Primary Care Physician: Dean Anderson Reason for Consultation:     Anemia, rectal bleeding        HPI:   Virtual Visit via Video Note  I connected with patient on 04/05/19 at  9:30 AM EDT by video ([email or web address]) and verified that I am speaking with the correct person using two identifiers.   I discussed the limitations, risks, security and privacy concerns of performing an evaluation and management service by video and the availability of in person appointments. I also discussed with the patient that there may be a patient responsible charge related to this service. The patient expressed understanding and agreed to proceed.  Location of the patient: Home Location of provider: Home Participating persons: Patient and provider only (Nursing staff checked in patient via phone but were not physically involved in the video interaction - see their notes)   History of Present Illness: Chief Complaint  Patient presents with  . New Patient (Initial Visit)    referral for anemia and screening colonoscopy    Dean Anderson is a 53 y.o. y/o male referred for consultation & management  by Dr. Darreld McleanLinda Anderson.  Patient reports intermittent history of bright red blood per rectum.  States happened very intermittently about twice last year and self resolved.  Occurred again in January of this year and then resolved with no further episodes in February.  Has reoccurred as of the last 2 to 3 weeks.  Reports seeing bright red blood in the toilet paper itself and the toilet bowl.  Sometimes it is just minute streaks in the stool, and sometimes the whole bowl is red.  Does not feel any skin tags or hemorrhoids when he wipes.  No nausea or vomiting.  No abdominal pain.  No dysphagia or heartburn.  No prior EGD or colonoscopy.   Was started on oral iron by primary care provider 2 weeks ago due to anemia and started noticing black stools since then.  No prior black stools before then.  As per primary care provider note, last CBC showed hemoglobin around 9.5.  Has low MCV on March blood work in care everywhere.  Past medical history: Paroxysmal A. fib, previously on Eliquis, has been off of it for over a month due to anemia.  Prior to Admission medications   Medication Sig Start Date End Date Taking? Authorizing Provider  aspirin EC 81 MG tablet Take 1 tablet (81 mg total) by mouth daily. 09/21/18 09/21/19  Dean Anderson, Sona, MD    No family history on file.   Social History   Tobacco Use  . Smoking status: Never Smoker  . Smokeless tobacco: Never Used  Substance Use Topics  . Alcohol use: Yes  . Drug use: Not on file    Allergies as of 04/05/2019  . (No Known Allergies)    Review of Systems:    All systems reviewed and negative except where noted in HPI.   Observations/Objective:  Labs: CBC    Component Value Date/Time   WBC 7.5 09/21/2018 1026   RBC 5.48 09/21/2018 1026   HGB 9.0 (L) 09/21/2018 1026   HCT 34.4 (L) 09/21/2018 1026   PLT 291 09/21/2018 1026   MCV 62.8 (L) 09/21/2018 1026   MCH 16.4 (L) 09/21/2018 1026   MCHC 26.2 (L) 09/21/2018 1026  RDW 19.8 (H) 09/21/2018 1026   CMP     Component Value Date/Time   NA 134 (L) 09/21/2018 1026   K 3.6 09/21/2018 1026   CL 103 09/21/2018 1026   CO2 23 09/21/2018 1026   GLUCOSE 106 (H) 09/21/2018 1026   BUN 8 09/21/2018 1026   CREATININE 0.87 09/21/2018 1026   CALCIUM 8.0 (L) 09/21/2018 1026   PROT 6.9 09/21/2018 1026   ALBUMIN 3.4 (L) 09/21/2018 1026   AST 32 09/21/2018 1026   ALT 26 09/21/2018 1026   ALKPHOS 45 09/21/2018 1026   BILITOT 1.6 (H) 09/21/2018 1026   GFRNONAA >60 09/21/2018 1026   GFRAA >60 09/21/2018 1026    Imaging Studies: No results found.  Assessment and Plan:   Dean Anderson is a 53 y.o. y/o male has been  referred for anemia and intermittent bright red blood per rectum  Assessment and Plan: Given that patient has never had endoscopic visualization, and has been having intermittent bright red blood per rectum for 1 to 2 years that has increased in frequency this year, and has significant anemia, endoscopic visualization is indicated at this time.  He is otherwise hemodynamically stable, and does not have any indication for emergent endoscopy or inpatient admission for the same.  Elective outpatient procedure however, is important to be done within the next few weeks as it would change his management significantly if a large lesion or malignancy is found.  However, given his history of paroxysmal A. fib with recent changes in medication, will obtain cardiac clearance from his cardiologist prior to the procedure.  continue follow-up with PCP for management of thyroid medications  We will also obtain right upper quadrant ultrasound given his daily alcohol use to evaluate for cirrhosis Liver enzymes were normal in March 2020, will repeat CMP and INR  Patient advised to abstain from alcohol use, and risks of developing cirrhosis, and comorbidities and increased mortality with daily alcohol use and development of cirrhosis discussed in detail and he verbalized understanding.  I have discussed alternative options, risks & benefits,  which include, but are not limited to, bleeding, infection, perforation,respiratory complication & drug reaction.  The patient agrees with this plan & written consent will be obtained.    The risks of undergoing further work-up in the setting of COVID-19 pandemic and possible exposures were discussed with the patient.  However, given his ongoing anemia and intermittent bright red blood per rectum, the benefits of diagnosis and further management outweigh the risks at this time.  The black stool only occurred after starting oral iron replacement.  He does not have any  abdominal pain, hematemesis, or nausea vomiting, to indicate that this is melena, or any signs of upper GI bleed.  Follow Up Instructions: EGD and colonoscopy for anemia in the next few weeks, once cardiac clearance obtained  Clinic follow-up in 3 to 4 weeks  I discussed the assessment and treatment plan with the patient. The patient was provided an opportunity to ask questions and all were answered. The patient agreed with the plan and demonstrated an understanding of the instructions.   The patient was advised to call back or seek an in-person evaluation if the symptoms worsen or if the condition fails to improve as anticipated.  I provided 30 minutes of face-to-face time via video software during this encounter.   Pasty Spillers, MD  Speech recognition software was used to dictate the above note.

## 2019-04-09 ENCOUNTER — Telehealth: Payer: Self-pay | Admitting: Gastroenterology

## 2019-04-09 NOTE — Telephone Encounter (Signed)
Received fax from Dr Buzzy Han [facility] on patient should stop Eliquis medication for 3 days prior to procedure. Patient to restart blood thinner 2 days after procedure.(Colonoscopy/EGD proc 04-12-2019)

## 2019-04-10 ENCOUNTER — Other Ambulatory Visit: Payer: Self-pay

## 2019-04-10 NOTE — Telephone Encounter (Signed)
Received same fax on prev. Message for Blood thinner clearance

## 2019-04-11 ENCOUNTER — Telehealth: Payer: Self-pay

## 2019-04-11 MED ORDER — NA SULFATE-K SULFATE-MG SULF 17.5-3.13-1.6 GM/177ML PO SOLN: 1.0000 | Freq: Once | ORAL | 0 refills | Status: AC

## 2019-04-11 NOTE — Telephone Encounter (Signed)
Received a fax from [facility] [employer] for pre authorization confirmation   auth ID [id number] case type : web Status: Approved effective 04/2019 Expires: October 07 2019 CPT [phone]

## 2019-04-11 NOTE — Telephone Encounter (Signed)
I spoke with pt regarding his RUQ ultrasound appt for 05/07/19 at 10 am at the [facility] (directions given); also of lab work that needs to be done at [facility] (directions given).  Also went over medications as he is not taking baby asa nor eliquis, and cardiac clearance obtained for EGD/Colonoscopy which is scheduled as urgent with Dr. Servando Snare on 04/17/19 at [facility] unit. Went over prep instructions and sent them via my chart. Aware to take metoprolol with sip of water the morning of procedure. Rx sent for suprep to his pharmacy and pt is aware.

## 2019-04-13 ENCOUNTER — Ambulatory Visit: Admission: RE | Admit: 2019-04-13 | Payer: Medicaid Other | Source: Ambulatory Visit

## 2019-04-13 DIAGNOSIS — D509 Iron deficiency anemia, unspecified: Secondary | ICD-10-CM | POA: Diagnosis not present

## 2019-04-13 DIAGNOSIS — K625 Hemorrhage of anus and rectum: Secondary | ICD-10-CM | POA: Diagnosis not present

## 2019-04-14 LAB — COMPREHENSIVE METABOLIC PANEL
ALT: 49 IU/L — ABNORMAL HIGH (ref 0–44)
AST: 57 IU/L — ABNORMAL HIGH (ref 0–40)
Albumin/Globulin Ratio: 1.5 (ref 1.2–2.2)
Albumin: 4.2 g/dL (ref 4.0–5.0)
Alkaline Phosphatase: 55 IU/L (ref 39–117)
BUN/Creatinine Ratio: 9 (ref 9–20)
BUN: 8 mg/dL (ref 6–24)
Bilirubin Total: 1.8 mg/dL — ABNORMAL HIGH (ref 0.0–1.2)
CO2: 17 mmol/L — ABNORMAL LOW (ref 20–29)
Calcium: 9.1 mg/dL (ref 8.7–10.2)
Chloride: 101 mmol/L (ref 96–106)
Creatinine, Ser: 0.89 mg/dL (ref 0.76–1.27)
GFR calc Af Amer: 115 mL/min/{1.73_m2} (ref >59)
GFR calc non Af Amer: 100 mL/min/{1.73_m2} (ref >59)
Globulin, Total: 2.8 g/dL (ref 1.5–4.5)
Glucose: 77 mg/dL (ref 65–99)
Potassium: 5.1 mmol/L (ref 3.5–5.2)
Sodium: 138 mmol/L (ref 134–144)
Total Protein: 7 g/dL (ref 6.0–8.5)

## 2019-04-14 LAB — PROTIME-INR
INR: 1 (ref 0.8–1.2)
Prothrombin Time: 10.8 s (ref 9.1–12.0)

## 2019-04-16 ENCOUNTER — Other Ambulatory Visit: Payer: Self-pay

## 2019-04-16 ENCOUNTER — Other Ambulatory Visit
Admission: RE | Admit: 2019-04-16 | Discharge: 2019-04-16 | Disposition: A | Discharge status: Home / Self Care | Payer: Medicaid Other | Source: Ambulatory Visit | Attending: Gastroenterology | Admitting: Gastroenterology

## 2019-04-16 DIAGNOSIS — Z1159 Encounter for screening for other viral diseases: Secondary | ICD-10-CM | POA: Insufficient documentation

## 2019-04-16 LAB — SARS CORONAVIRUS 2 BY RT PCR (HOSPITAL ORDER, PERFORMED IN ~~LOC~~ HOSPITAL LAB): SARS Coronavirus 2: NEGATIVE

## 2019-04-16 NOTE — Addendum Note (Signed)
Addended by: [name] on: 04/16/2019 09:20 AM   Modules accepted: Orders

## 2019-04-17 ENCOUNTER — Encounter
Admission: RE | Disposition: A | Discharge status: Home / Self Care | Payer: Self-pay | Source: Home / Self Care | Attending: Gastroenterology

## 2019-04-17 ENCOUNTER — Ambulatory Visit: Payer: Medicaid Other | Admitting: Certified Registered Nurse Anesthetist

## 2019-04-17 ENCOUNTER — Ambulatory Visit
Admission: RE | Admit: 2019-04-17 | Discharge: 2019-04-17 | Disposition: A | Discharge status: Home / Self Care | Payer: Medicaid Other | Attending: Gastroenterology | Admitting: Gastroenterology

## 2019-04-17 ENCOUNTER — Encounter: Payer: Self-pay | Admitting: Certified Registered Nurse Anesthetist

## 2019-04-17 ENCOUNTER — Other Ambulatory Visit: Payer: Self-pay

## 2019-04-17 ENCOUNTER — Telehealth: Payer: Self-pay

## 2019-04-17 DIAGNOSIS — I10 Essential (primary) hypertension: Secondary | ICD-10-CM | POA: Insufficient documentation

## 2019-04-17 DIAGNOSIS — K228 Other specified diseases of esophagus: Secondary | ICD-10-CM | POA: Diagnosis not present

## 2019-04-17 DIAGNOSIS — Z7982 Long term (current) use of aspirin: Secondary | ICD-10-CM | POA: Insufficient documentation

## 2019-04-17 DIAGNOSIS — D649 Anemia, unspecified: Secondary | ICD-10-CM | POA: Diagnosis not present

## 2019-04-17 DIAGNOSIS — B9681 Helicobacter pylori [H. pylori] as the cause of diseases classified elsewhere: Secondary | ICD-10-CM | POA: Diagnosis not present

## 2019-04-17 DIAGNOSIS — K2900 Acute gastritis without bleeding: Secondary | ICD-10-CM | POA: Diagnosis not present

## 2019-04-17 DIAGNOSIS — R0602 Shortness of breath: Secondary | ICD-10-CM | POA: Diagnosis not present

## 2019-04-17 DIAGNOSIS — K2270 Barrett's esophagus without dysplasia: Secondary | ICD-10-CM | POA: Diagnosis not present

## 2019-04-17 DIAGNOSIS — K921 Melena: Secondary | ICD-10-CM | POA: Diagnosis not present

## 2019-04-17 DIAGNOSIS — K641 Second degree hemorrhoids: Secondary | ICD-10-CM | POA: Insufficient documentation

## 2019-04-17 DIAGNOSIS — K449 Diaphragmatic hernia without obstruction or gangrene: Secondary | ICD-10-CM | POA: Diagnosis not present

## 2019-04-17 DIAGNOSIS — K2970 Gastritis, unspecified, without bleeding: Secondary | ICD-10-CM | POA: Insufficient documentation

## 2019-04-17 DIAGNOSIS — K2950 Unspecified chronic gastritis without bleeding: Secondary | ICD-10-CM | POA: Insufficient documentation

## 2019-04-17 DIAGNOSIS — D509 Iron deficiency anemia, unspecified: Secondary | ICD-10-CM | POA: Diagnosis not present

## 2019-04-17 DIAGNOSIS — I480 Paroxysmal atrial fibrillation: Secondary | ICD-10-CM | POA: Diagnosis not present

## 2019-04-17 DIAGNOSIS — K625 Hemorrhage of anus and rectum: Secondary | ICD-10-CM | POA: Diagnosis not present

## 2019-04-17 DIAGNOSIS — D500 Iron deficiency anemia secondary to blood loss (chronic): Secondary | ICD-10-CM | POA: Diagnosis not present

## 2019-04-17 HISTORY — PX: COLONOSCOPY WITH PROPOFOL: SHX5780

## 2019-04-17 HISTORY — PX: ESOPHAGOGASTRODUODENOSCOPY (EGD) WITH PROPOFOL: SHX5813

## 2019-04-17 SURGERY — COLONOSCOPY WITH PROPOFOL
Anesthesia: General

## 2019-04-17 MED ORDER — PROPOFOL 500 MG/50ML IV EMUL
INTRAVENOUS | Status: DC | PRN
  Administered 2019-04-17: 175 ug/kg/min via INTRAVENOUS

## 2019-04-17 MED ORDER — LIDOCAINE HCL (CARDIAC) PF 100 MG/5ML IV SOSY
PREFILLED_SYRINGE | INTRAVENOUS | Status: DC | PRN
  Administered 2019-04-17: 50 mg via INTRAVENOUS

## 2019-04-17 MED ORDER — SODIUM CHLORIDE 0.9 % IV SOLN
INTRAVENOUS | Status: DC | PRN
  Administered 2019-04-17: 13:00:00 via INTRAVENOUS

## 2019-04-17 MED ORDER — PANTOPRAZOLE SODIUM 40 MG PO TBEC: 40.0000 mg | DELAYED_RELEASE_TABLET | Freq: Every day | ORAL | 1 refills | Status: DC

## 2019-04-17 MED ORDER — METOPROLOL TARTRATE 5 MG/5ML IV SOLN
INTRAVENOUS | Status: DC | PRN
  Administered 2019-04-17: 5 mg via INTRAVENOUS

## 2019-04-17 MED ORDER — METOPROLOL TARTRATE 5 MG/5ML IV SOLN
INTRAVENOUS | Status: AC
  Filled 2019-04-17: qty 5

## 2019-04-17 MED ORDER — PROPOFOL 500 MG/50ML IV EMUL
INTRAVENOUS | Status: AC
  Filled 2019-04-17: qty 50

## 2019-04-17 MED ORDER — PROPOFOL 10 MG/ML IV BOLUS
INTRAVENOUS | Status: DC | PRN
  Administered 2019-04-17: 30 mg via INTRAVENOUS
  Administered 2019-04-17: 70 mg via INTRAVENOUS

## 2019-04-17 NOTE — Interval H&P Note (Signed)
History and Physical Interval Note:  04/17/2019 1:11 PM  Dean Anderson  has presented today for surgery, with the diagnosis of D50.9 anemia; K62.5 rectal bleeding.  The various methods of treatment have been discussed with the patient and family. After consideration of risks, benefits and other options for treatment, the patient has consented to  Procedure(s): COLONOSCOPY WITH PROPOFOL (N/A) ESOPHAGOGASTRODUODENOSCOPY (EGD) WITH PROPOFOL (N/A) as a surgical intervention.  The patient's history has been reviewed, patient examined, no change in status, stable for surgery.  I have reviewed the patient's chart and labs.  Questions were answered to the patient's satisfaction.     Ermal Brzozowski [employer]

## 2019-04-17 NOTE — Op Note (Signed)
[facility] Gastroenterology Patient Name: Dean Anderson Procedure Date: 04/17/2019 11:32 AM MRN: [id number] Account #: [id number] Date of Birth: [birth year] Admit Type: Outpatient Age: 53 Room: [facility] ENDO ROOM 4 Gender: Male Note Status: Finalized Procedure:            Colonoscopy Indications:          Hematochezia Providers:            Midge Minium MD, MD Referring MD:         Leanna Sato, MD (Referring MD) Medicines:            Propofol per Anesthesia Complications:        No immediate complications. Procedure:            Pre-Anesthesia Assessment:                       - Prior to the procedure, a History and Physical was                        performed, and patient medications and allergies were                        reviewed. The patient's tolerance of previous                        anesthesia was also reviewed. The risks and benefits of                        the procedure and the sedation options and risks were                        discussed with the patient. All questions were                        answered, and informed consent was obtained. Prior                        Anticoagulants: The patient has taken no previous                        anticoagulant or antiplatelet agents. ASA Grade                        Assessment: II - A patient with mild systemic disease.                        After reviewing the risks and benefits, the patient was                        deemed in satisfactory condition to undergo the                        procedure.                       After obtaining informed consent, the colonoscope was                        passed under direct vision. Throughout the procedure,  the patient's blood pressure, pulse, and oxygen                        saturations were monitored continuously. The                        Colonoscope was introduced through the anus and                        advanced to the the  cecum, identified by appendiceal                        orifice and ileocecal valve. The colonoscopy was                        performed without difficulty. The patient tolerated the                        procedure well. The quality of the bowel preparation                        was excellent. Findings:      The perianal and digital rectal examinations were normal.      Non-bleeding internal hemorrhoids were found during retroflexion. The       hemorrhoids were Grade II (internal hemorrhoids that prolapse but reduce       spontaneously). Impression:           - Non-bleeding internal hemorrhoids.                       - No specimens collected. Recommendation:       - Discharge patient to home.                       - Resume previous diet.                       - Continue present medications.                       - Await pathology results. Procedure Code(s):    --- Professional ---                       [phone], Colonoscopy, flexible; diagnostic, including                        collection of specimen(s) by brushing or washing, when                        performed (separate procedure) Diagnosis Code(s):    --- Professional ---                       K92.1, Melena (includes Hematochezia) CPT copyright 2019 American Medical Association. All rights reserved. The codes documented in this report are preliminary and upon coder review may  be revised to meet current compliance requirements. Midge Miniumarren Vicci Reder MD, MD 04/17/2019 1:51:57 PM This report has been signed electronically. Number of Addenda: 0 Note Initiated On: 04/17/2019 11:32 AM Scope Withdrawal Time: 0 hours 6 minutes 15 seconds  Total Procedure Duration: 0 hours 8 minutes 46 seconds       [facility]

## 2019-04-17 NOTE — Telephone Encounter (Signed)
-----   Message from Pasty Spillers, MD sent at 04/16/2019  9:22 AM EDT ----- Eunice Blase please let patient know, his liver enzymes are mildly elevated. He should abstain from alcohol use as this is the likely cause of his elevated liver enzymes. However, I have ordered more bloodwork to evaluate for any other causes of elevated liver enzymes.  I have ordered this to be done at [facility] health, since he is going for his procedure he can have the labs done on the same day.  He should go to his scheduled ultrasound appointment as well.

## 2019-04-17 NOTE — Anesthesia Preprocedure Evaluation (Signed)
Anesthesia Evaluation  Patient identified by MRN, date of birth, ID band Patient awake    Reviewed: Allergy & Precautions, NPO status , Patient's Chart, lab work & pertinent test results, reviewed documented beta blocker date and time   Airway Mallampati: II       Dental   Pulmonary shortness of breath and with exertion,    Pulmonary exam normal        Cardiovascular hypertension, Pt. on home beta blockers and Pt. on medications Normal cardiovascular exam+ dysrhythmias Atrial Fibrillation      Neuro/Psych negative neurological ROS  negative psych ROS   GI/Hepatic Neg liver ROS,   Endo/Other  negative endocrine ROS  Renal/GU negative Renal ROS     Musculoskeletal negative musculoskeletal ROS (+)   Abdominal Normal abdominal exam  (+)   Peds negative pediatric ROS (+)  Hematology negative hematology ROS (+)   Anesthesia Other Findings History reviewed. No pertinent past medical history.  Reproductive/Obstetrics                             Anesthesia Physical Anesthesia Plan  ASA: III  Anesthesia Plan: General   Post-op Pain Management:    Induction: Intravenous  PONV Risk Score and Plan: Propofol infusion  Airway Management Planned: Nasal Cannula  Additional Equipment:   Intra-op Plan:   Post-operative Plan:   Informed Consent: I have reviewed the patients History and Physical, chart, labs and discussed the procedure including the risks, benefits and alternatives for the proposed anesthesia with the patient or authorized representative who has indicated his/her understanding and acceptance.     Dental advisory given  Plan Discussed with: CRNA and Surgeon  Anesthesia Plan Comments:         Anesthesia Quick Evaluation

## 2019-04-17 NOTE — Anesthesia Post-op Follow-up Note (Signed)
Anesthesia QCDR form completed.        

## 2019-04-17 NOTE — Op Note (Signed)
[facility] Gastroenterology Patient Name: Dean Anderson Procedure Date: 04/17/2019 11:32 AM MRN: [id number] Account #: [id number] Date of Birth: 07-Jul-1966 Admit Type: Outpatient Age: 53 Room: [facility] ENDO ROOM 4 Gender: Male Note Status: Finalized Procedure:            Upper GI endoscopy Indications:          Iron deficiency anemia Providers:            Midge Minium MD, MD Referring MD:         Leanna Sato, MD (Referring MD) Medicines:            Propofol per Anesthesia Complications:        No immediate complications. Procedure:            Pre-Anesthesia Assessment:                       - Prior to the procedure, a History and Physical was                        performed, and patient medications and allergies were                        reviewed. The patient's tolerance of previous                        anesthesia was also reviewed. The risks and benefits of                        the procedure and the sedation options and risks were                        discussed with the patient. All questions were                        answered, and informed consent was obtained. Prior                        Anticoagulants: The patient has taken no previous                        anticoagulant or antiplatelet agents. ASA Grade                        Assessment: II - A patient with mild systemic disease.                        After reviewing the risks and benefits, the patient was                        deemed in satisfactory condition to undergo the                        procedure.                       After obtaining informed consent, the endoscope was                        passed under direct vision. Throughout the procedure,  the patient's blood pressure, pulse, and oxygen                        saturations were monitored continuously. The Endoscope                        was introduced through the mouth, and advanced to the                         second part of duodenum. The upper GI endoscopy was                        accomplished without difficulty. The patient tolerated                        the procedure well. Findings:      A medium-sized hiatal hernia was present.      There were esophageal mucosal changes suspicious for long-segment       Barrett's esophagus present in the lower third of the esophagus. The       maximum longitudinal extent of these mucosal changes was 6 cm in length.       Mucosa was biopsied with a cold forceps for histology in 4 quadrants at       intervals of 2 cm at 30, 32 and 34 cm from the incisors.      Localized moderate inflammation characterized by erythema was found in       the gastric antrum. Biopsies were taken with a cold forceps for       histology.      The examined duodenum was normal. Impression:           - Medium-sized hiatal hernia.                       - Esophageal mucosal changes suspicious for                        long-segment Barrett's esophagus. Biopsied.                       - Gastritis. Biopsied.                       - Normal examined duodenum. Recommendation:       - Await pathology results.                       - Discharge patient to home.                       - Resume previous diet.                       - Use a proton pump inhibitor PO daily. Procedure Code(s):    --- Professional ---                       [phone], Esophagogastroduodenoscopy, flexible, transoral;                        with biopsy, single or multiple Diagnosis Code(s):    --- Professional ---  D50.9, Iron deficiency anemia, unspecified                       K22.8, Other specified diseases of esophagus                       K29.70, Gastritis, unspecified, without bleeding CPT copyright 2019 American Medical Association. All rights reserved. The codes documented in this report are preliminary and upon coder review may  be revised to meet current compliance  requirements. Midge Minium MD, MD 04/17/2019 1:38:50 PM This report has been signed electronically. Number of Addenda: 0 Note Initiated On: 04/17/2019 11:32 AM      [facility]

## 2019-04-17 NOTE — Anesthesia Procedure Notes (Signed)
Date/Time: 04/17/2019 1:22 PM Performed by: Ginger Carne, CRNA Pre-anesthesia Checklist: Patient identified, Emergency Drugs available, Suction available, Patient being monitored and Timeout performed Patient Re-evaluated:Patient Re-evaluated prior to induction Oxygen Delivery Method: Nasal cannula Preoxygenation: Pre-oxygenation with 100% oxygen Induction Type: IV induction

## 2019-04-17 NOTE — Telephone Encounter (Signed)
Will call pt tomorrow. Pt had colonoscopy today.

## 2019-04-17 NOTE — Transfer of Care (Signed)
Immediate Anesthesia Transfer of Care Note  Patient: Dean Anderson  Procedure(s) Performed: COLONOSCOPY WITH PROPOFOL (N/A ) ESOPHAGOGASTRODUODENOSCOPY (EGD) WITH PROPOFOL (N/A )  Patient Location: PACU  Anesthesia Type:General  Level of Consciousness: sedated  Airway & Oxygen Therapy: Patient Spontanous Breathing and Patient connected to nasal cannula oxygen  Post-op Assessment: Report given to RN and Post -op Vital signs reviewed and stable  Post vital signs: Reviewed and stable  Last Vitals:  Vitals Value Taken Time  BP 122/90 04/17/2019  1:54 PM  Temp 36.3 C 04/17/2019  1:54 PM  Pulse 84 04/17/2019  1:54 PM  Resp 20 04/17/2019  1:54 PM  SpO2 100 % 04/17/2019  1:54 PM    Last Pain:  Vitals:   04/17/19 1354  TempSrc:   PainSc: Asleep         Complications: No apparent anesthesia complications

## 2019-04-18 ENCOUNTER — Encounter: Payer: Self-pay | Admitting: Gastroenterology

## 2019-04-18 NOTE — Telephone Encounter (Signed)
Left message to contact office

## 2019-04-18 NOTE — Anesthesia Postprocedure Evaluation (Signed)
Anesthesia Post Note  Patient: Dean Anderson  Procedure(s) Performed: COLONOSCOPY WITH PROPOFOL (N/A ) ESOPHAGOGASTRODUODENOSCOPY (EGD) WITH PROPOFOL (N/A )  Patient location during evaluation: Endoscopy Anesthesia Type: General Level of consciousness: awake and alert and oriented Pain management: pain level controlled Vital Signs Assessment: post-procedure vital signs reviewed and stable Respiratory status: spontaneous breathing Cardiovascular status: blood pressure returned to baseline Anesthetic complications: no     Last Vitals:  Vitals:   04/17/19 1414 04/17/19 1424  BP: (!) 128/100 130/87  Pulse: 85 83  Resp: 19 (!) 22  Temp:    SpO2: 98% 99%    Last Pain:  Vitals:   04/17/19 1424  TempSrc:   PainSc: 0-No pain                 Finneus Kaneshiro

## 2019-04-19 ENCOUNTER — Other Ambulatory Visit: Payer: Self-pay | Admitting: Gastroenterology

## 2019-04-19 ENCOUNTER — Telehealth: Payer: Self-pay

## 2019-04-19 ENCOUNTER — Other Ambulatory Visit: Payer: Self-pay

## 2019-04-19 DIAGNOSIS — R748 Abnormal levels of other serum enzymes: Secondary | ICD-10-CM

## 2019-04-19 LAB — SURGICAL PATHOLOGY

## 2019-04-19 MED ORDER — AMOXICILLIN 500 MG PO TABS: 1000.0000 mg | ORAL_TABLET | Freq: Two times a day (BID) | ORAL | 0 refills | Status: DC

## 2019-04-19 MED ORDER — CLARITHROMYCIN 250 MG PO TABS: 500.0000 mg | ORAL_TABLET | Freq: Two times a day (BID) | ORAL | 0 refills | Status: DC

## 2019-04-19 MED ORDER — OMEPRAZOLE 20 MG PO CPDR: DELAYED_RELEASE_CAPSULE | ORAL | 0 refills | Status: DC

## 2019-04-19 NOTE — Telephone Encounter (Signed)
-----   Message from Pasty Spillers, MD sent at 04/19/2019 12:17 PM EDT ----- Eunice Blase, please let the patient know his biopsies of the stomach showed H. pylori.  I have sent triple therapy over to his pharmacy.  After completing this therapy of 14 days, he will need to continue taking omeprazole 20 mg once daily due to Barrett's esophagus seen in his esophagus.  This is a change caused by reflux.  Follow-up telemedicine with me in 2 weeks to discuss results and further management.  He had blood work ordered that he has not had done.  Please asked him to get this done as it is important, and also add a CBC to that pending blood work.  He will need a small bowel capsule study as the cause of his anemia is not explained by any findings on his upper endoscopy or colonoscopy.  Our equipment is still not working, you can go ahead and refer him to [facility] for small bowel capsule study.  Also refer him to hematology for anemia

## 2019-04-19 NOTE — Telephone Encounter (Signed)
Unable to reach by phone. Left message to check my chart messages.

## 2019-04-23 ENCOUNTER — Other Ambulatory Visit: Payer: Self-pay

## 2019-04-23 DIAGNOSIS — D509 Iron deficiency anemia, unspecified: Secondary | ICD-10-CM

## 2019-04-23 DIAGNOSIS — R748 Abnormal levels of other serum enzymes: Secondary | ICD-10-CM

## 2019-04-24 ENCOUNTER — Other Ambulatory Visit: Payer: Self-pay

## 2019-04-24 MED ORDER — AMOXICILLIN 500 MG PO TABS: 1000.0000 mg | ORAL_TABLET | Freq: Two times a day (BID) | ORAL | 0 refills | Status: AC

## 2019-04-24 MED ORDER — CLARITHROMYCIN 250 MG PO TABS: 500.0000 mg | ORAL_TABLET | Freq: Two times a day (BID) | ORAL | 0 refills | Status: AC

## 2019-04-24 MED ORDER — OMEPRAZOLE 20 MG PO CPDR: DELAYED_RELEASE_CAPSULE | ORAL | 0 refills | Status: DC

## 2019-04-24 NOTE — Telephone Encounter (Signed)
Pt is calling he was supposed to have rx at [facility] at [place] rx triple therapy  For H - Polori treatment he does have the rx for  Protonicx but not the other.  they also need to know about the Lab work that needed to be done still please call pt

## 2019-04-24 NOTE — Telephone Encounter (Signed)
I spoke with pt and informed him that I sent in his 3 prescriptions to [facility] on [address] also went over lab work and will resend to [facility]. Instead of [facility].

## 2019-04-24 NOTE — Addendum Note (Signed)
Addended by: Jackquline Denmark on: 04/24/2019 01:09 PM   Modules accepted: Orders

## 2019-04-24 NOTE — Addendum Note (Signed)
Addended by: Jackquline Denmark on: 04/24/2019 01:11 PM   Modules accepted: Orders

## 2019-04-24 NOTE — Telephone Encounter (Signed)
Tried to contact pt again. I left detailed message about his medication, and referrals sent to [facility] at [facility] with hematology and to be expecting a call from them.

## 2019-04-24 NOTE — Addendum Note (Signed)
Addended by: Jackquline Denmark on: 04/24/2019 01:10 PM   Modules accepted: Orders

## 2019-04-25 ENCOUNTER — Telehealth: Payer: Self-pay | Admitting: Internal Medicine

## 2019-04-25 DIAGNOSIS — D509 Iron deficiency anemia, unspecified: Secondary | ICD-10-CM

## 2019-04-25 NOTE — Telephone Encounter (Signed)
Referral for iron-deficiency anemia for small capsule study.  Please schedule pt .

## 2019-04-26 DIAGNOSIS — R748 Abnormal levels of other serum enzymes: Secondary | ICD-10-CM | POA: Diagnosis not present

## 2019-04-26 NOTE — Telephone Encounter (Signed)
Patient contacted about capsule endoscopy referral. .  He is scheduled for 05/17/19 8:00.  He is advised that I will mail him the instructions.  He is asked to call if he has questions.

## 2019-04-27 ENCOUNTER — Other Ambulatory Visit: Payer: Self-pay | Admitting: *Deleted

## 2019-04-27 ENCOUNTER — Other Ambulatory Visit: Payer: Self-pay

## 2019-04-27 ENCOUNTER — Encounter: Payer: Self-pay | Admitting: Internal Medicine

## 2019-04-27 ENCOUNTER — Inpatient Hospital Stay: Payer: Medicaid Other | Attending: Internal Medicine | Admitting: Internal Medicine

## 2019-04-27 DIAGNOSIS — K909 Intestinal malabsorption, unspecified: Secondary | ICD-10-CM

## 2019-04-27 DIAGNOSIS — D500 Iron deficiency anemia secondary to blood loss (chronic): Secondary | ICD-10-CM

## 2019-04-27 DIAGNOSIS — D508 Other iron deficiency anemias: Secondary | ICD-10-CM | POA: Diagnosis not present

## 2019-04-27 NOTE — Progress Notes (Signed)
Called patient for Televisit via [employer] for new patient consult.  Patient states he has fatigue and shortness of breath.

## 2019-04-27 NOTE — Progress Notes (Signed)
I connected with [name] on 04/27/2019 at  1:00 PM EDT by video enabled telemedicine visit and verified that I am speaking with the correct person using two identifiers.  I discussed the limitations, risks, security and privacy concerns of performing an evaluation and management service by telemedicine and the availability of in-person appointments. I also discussed with the patient that there may be a patient responsible charge related to this service. The patient expressed understanding and agreed to proceed.    Other persons participating in the visit and their role in the encounter: RN medical reconciliation Patient's location: Home Provider's location: Home  Oncology History   No history exists.     Chief Complaint: Anemia   History of present illness:Dean Anderson 53 y.o.  male has been referred to [place] for further evaluation recommendations for anemia.  Patient continues to have intermittent episodes of blood in stools.  This is thought to be hemorrhoidal.  Denies any blood in urine.  Also complains of chronic 1-2 loose stools every day.  He has been taking Imodium.  He denies weight loss or nausea vomiting difficulty swallowing.  Patient had EGD colonoscopy in January 2020.  Observation/objective:  Assessment and plan: Iron deficiency anemia due to chronic blood loss #Iron deficient anemia-most recent hemoglobin 13.  Continue p.o. iron.  #Etiology is unclear.  Awaiting capsule study on June 11.  Thank you for allowing me to participate in the care of your pleasant patient. Please do not hesitate to contact me with questions or concerns in the interim.  # follow up in end of June 29th week-MD- labs-cbc/LDH/iron studies/ferritin- Dr.B    Follow-up instructions:  I discussed the assessment and treatment plan with the patient.  The patient was provided an opportunity to ask questions and all were answered.  The patient agreed with the plan and demonstrated understanding of  instructions.  The patient was advised to call back or seek an in person evaluation if the symptoms worsen or if the condition fails to improve as anticipated.  Dr. Louretta Shorten [facility] 05/30/2019 7:45 AM

## 2019-04-27 NOTE — Assessment & Plan Note (Addendum)
#  Iron deficient anemia-most recent hemoglobin 13.  Continue p.o. iron.  #Etiology is unclear.  Awaiting capsule study on June 11.  Thank you for allowing me to participate in the care of your pleasant patient. Please do not hesitate to contact me with questions or concerns in the interim.  # follow up in end of June 29th week-MD- labs-cbc/LDH/iron studies/ferritin- Dr.B

## 2019-05-01 ENCOUNTER — Ambulatory Visit: Payer: Medicaid Other | Admitting: Gastroenterology

## 2019-05-01 LAB — HEPATITIS B CORE ANTIBODY, TOTAL: Hep B Core Total Ab: NEGATIVE

## 2019-05-01 LAB — HCV COMMENT:

## 2019-05-01 LAB — HEPATITIS A ANTIBODY, TOTAL: hep A Total Ab: NEGATIVE

## 2019-05-01 LAB — CERULOPLASMIN: Ceruloplasmin: 23.2 mg/dL (ref 16.0–31.0)

## 2019-05-01 LAB — FERRITIN: Ferritin: 44 ng/mL (ref 30–400)

## 2019-05-01 LAB — CBC
Hematocrit: 42.9 % (ref 37.5–51.0)
Hemoglobin: 13 g/dL (ref 13.0–17.7)
MCH: 23.7 pg — ABNORMAL LOW (ref 26.6–33.0)
MCHC: 30.3 g/dL — ABNORMAL LOW (ref 31.5–35.7)
MCV: 78 fL — ABNORMAL LOW (ref 79–97)
Platelets: 296 10*3/uL (ref 150–450)
RBC: 5.48 x10E6/uL (ref 4.14–5.80)
WBC: 5.4 10*3/uL (ref 3.4–10.8)

## 2019-05-01 LAB — HEPATITIS A ANTIBODY, IGM: Hep A IgM: NEGATIVE

## 2019-05-01 LAB — HEPATITIS B CORE ANTIBODY, IGM: Hep B C IgM: NEGATIVE

## 2019-05-01 LAB — MITOCHONDRIAL/SMOOTH MUSCLE AB PNL
Mitochondrial Ab: <20 Units (ref 0.0–20.0)
Smooth Muscle Ab: 7 Units (ref 0–19)

## 2019-05-01 LAB — HEPATITIS B SURFACE ANTIBODY,QUALITATIVE: Hep B Surface Ab, Qual: NONREACTIVE

## 2019-05-01 LAB — HEPATITIS C ANTIBODY (REFLEX): HCV Ab: <0.1 s/co ratio (ref 0.0–0.9)

## 2019-05-01 LAB — HEPATITIS B SURFACE ANTIGEN: Hepatitis B Surface Ag: NEGATIVE

## 2019-05-07 ENCOUNTER — Ambulatory Visit
Admission: RE | Admit: 2019-05-07 | Discharge: 2019-05-07 | Disposition: A | Discharge status: Home / Self Care | Payer: Medicaid Other | Source: Ambulatory Visit | Attending: Gastroenterology | Admitting: Gastroenterology

## 2019-05-07 ENCOUNTER — Other Ambulatory Visit: Payer: Self-pay

## 2019-05-07 DIAGNOSIS — D509 Iron deficiency anemia, unspecified: Secondary | ICD-10-CM | POA: Insufficient documentation

## 2019-05-07 DIAGNOSIS — R945 Abnormal results of liver function studies: Secondary | ICD-10-CM | POA: Diagnosis not present

## 2019-05-07 DIAGNOSIS — K625 Hemorrhage of anus and rectum: Secondary | ICD-10-CM | POA: Diagnosis not present

## 2019-05-07 HISTORY — PX: GIVENS CAPSULE STUDY: SHX5432

## 2019-05-08 ENCOUNTER — Encounter: Payer: Self-pay | Admitting: *Deleted

## 2019-05-08 ENCOUNTER — Ambulatory Visit (INDEPENDENT_AMBULATORY_CARE_PROVIDER_SITE_OTHER): Payer: Self-pay | Admitting: Gastroenterology

## 2019-05-08 DIAGNOSIS — Z5329 Procedure and treatment not carried out because of patient's decision for other reasons: Secondary | ICD-10-CM

## 2019-05-17 ENCOUNTER — Other Ambulatory Visit: Payer: Self-pay

## 2019-05-17 ENCOUNTER — Encounter: Payer: Self-pay | Admitting: Internal Medicine

## 2019-05-17 ENCOUNTER — Ambulatory Visit (INDEPENDENT_AMBULATORY_CARE_PROVIDER_SITE_OTHER): Payer: Medicaid Other | Admitting: Internal Medicine

## 2019-05-17 DIAGNOSIS — D500 Iron deficiency anemia secondary to blood loss (chronic): Secondary | ICD-10-CM | POA: Diagnosis not present

## 2019-05-17 NOTE — Progress Notes (Signed)
Patient completed prep without difficultly. Capsule swallowed, patient instructed on retrieval.  Serial #[id number] Exp. 02/25/2020.

## 2019-06-01 ENCOUNTER — Encounter: Payer: Self-pay | Admitting: Internal Medicine

## 2019-06-04 ENCOUNTER — Telehealth: Payer: Self-pay | Admitting: Gastroenterology

## 2019-06-04 ENCOUNTER — Encounter: Payer: Self-pay | Admitting: Internal Medicine

## 2019-06-04 NOTE — Telephone Encounter (Signed)
-----   Message from Virgel Manifold, MD sent at 06/04/2019  7:55 AM EDT -----  Please set up clinic appointment with me next available.

## 2019-06-04 NOTE — Telephone Encounter (Signed)
Pt is scheduled for apt for 06/13/19 per Dr. Bonna Gains 's note pt also states he is scheduled to see a Gi doctor tomorrow in [place].

## 2019-06-04 NOTE — Telephone Encounter (Signed)
Left vm for pt to call office  And schedule apt with  Dr. Bonna Gains next available

## 2019-06-05 ENCOUNTER — Other Ambulatory Visit: Payer: Self-pay

## 2019-06-05 ENCOUNTER — Telehealth: Payer: Self-pay | Admitting: Internal Medicine

## 2019-06-05 ENCOUNTER — Inpatient Hospital Stay: Payer: Medicaid Other | Attending: Internal Medicine

## 2019-06-05 ENCOUNTER — Encounter: Payer: Self-pay | Admitting: Internal Medicine

## 2019-06-05 ENCOUNTER — Inpatient Hospital Stay (HOSPITAL_BASED_OUTPATIENT_CLINIC_OR_DEPARTMENT_OTHER): Payer: Medicaid Other | Admitting: Internal Medicine

## 2019-06-05 DIAGNOSIS — I4891 Unspecified atrial fibrillation: Secondary | ICD-10-CM | POA: Diagnosis not present

## 2019-06-05 DIAGNOSIS — D500 Iron deficiency anemia secondary to blood loss (chronic): Secondary | ICD-10-CM | POA: Diagnosis not present

## 2019-06-05 DIAGNOSIS — Z7289 Other problems related to lifestyle: Secondary | ICD-10-CM | POA: Diagnosis not present

## 2019-06-05 DIAGNOSIS — R030 Elevated blood-pressure reading, without diagnosis of hypertension: Secondary | ICD-10-CM | POA: Diagnosis not present

## 2019-06-05 LAB — CBC WITH DIFFERENTIAL/PLATELET
Abs Immature Granulocytes: 0.01 10*3/uL (ref 0.00–0.07)
Basophils Absolute: 0 10*3/uL (ref 0.0–0.1)
Basophils Relative: 1 %
Eosinophils Absolute: 0.2 10*3/uL (ref 0.0–0.5)
Eosinophils Relative: 4 %
HCT: 47 % (ref 39.0–52.0)
Hemoglobin: 15.4 g/dL (ref 13.0–17.0)
Immature Granulocytes: 0 %
Lymphocytes Relative: 18 %
Lymphs Abs: 1 10*3/uL (ref 0.7–4.0)
MCH: 27.6 pg (ref 26.0–34.0)
MCHC: 32.8 g/dL (ref 30.0–36.0)
MCV: 84.2 fL (ref 80.0–100.0)
Monocytes Absolute: 0.7 10*3/uL (ref 0.1–1.0)
Monocytes Relative: 12 %
Neutro Abs: 3.7 10*3/uL (ref 1.7–7.7)
Neutrophils Relative %: 65 %
Platelets: 180 10*3/uL (ref 150–400)
RBC: 5.58 MIL/uL (ref 4.22–5.81)
RDW: 18.6 % — ABNORMAL HIGH (ref 11.5–15.5)
WBC: 5.6 10*3/uL (ref 4.0–10.5)
nRBC: 0 % (ref 0.0–0.2)

## 2019-06-05 LAB — FERRITIN: Ferritin: 54 ng/mL (ref 24–336)

## 2019-06-05 LAB — IRON AND TIBC
Iron: 165 ug/dL (ref 45–182)
Saturation Ratios: 45 % — ABNORMAL HIGH (ref 17.9–39.5)
TIBC: 369 ug/dL (ref 250–450)
UIBC: 204 ug/dL

## 2019-06-05 LAB — LACTATE DEHYDROGENASE: LDH: 123 U/L (ref 98–192)

## 2019-06-05 NOTE — Progress Notes (Signed)
Patient reports having an abdominal U/S at beginning of the month but has not heard about results.  Also had capsule endoscopy on 05/17/19.  His BP is elevated today at 141/109 (1st attempt) 157/104(2nd attempt) 163/113 (3rd attempt) took BP med at 7 a.m. today.

## 2019-06-05 NOTE — Telephone Encounter (Signed)
Hi Dean Anderson:  I wanted to inform you that your iron studies- are adequate; but the reason for your anemia is not clear. I would recommend CT scan of Abdomen/pelvis; and also Urine analysis.   If you are agreeable; our scheduler will call you for scheduling the appointments of CT scan/UA.   Thank you, Dr.B/ my chart message sent.

## 2019-06-05 NOTE — Progress Notes (Signed)
[facility] CONSULT NOTE  Patient Care Team: Leanna SatoMiles, Linda M, MD as PCP - General (Family Medicine)  CHIEF COMPLAINTS/PURPOSE OF CONSULTATION: Iron deficiency anemia  # SEP 2019- chronic microcytic anemia-~9; May 2020- EGD colonoscopy; capsule study [? hemorroidal]- NEG [Dr.Wohl]  # A.fib  [Dr.Kowalski]; off Eliquis sec to #1  # Alcohol use   Oncology History   No history exists.     HISTORY OF PRESENTING ILLNESS:  Dean Anderson 53 y.o.  male history of iron deficiency anemia of unclear etiology is here for follow-up.  Patient underwent extensive work-up with EGD/colonoscopy/capsule study-unrevealing for cause of his anemia.  He is currently on p.o. iron.  Patient currently denies any blood in stools or black or stools.  No nausea no vomiting.  Appetite is fair.  Denies any chest pain.  Review of Systems  Constitutional: Negative for chills, diaphoresis, fever, malaise/fatigue and weight loss.  HENT: Negative for nosebleeds and sore throat.   Eyes: Negative for double vision.  Respiratory: Negative for cough, hemoptysis, sputum production, shortness of breath and wheezing.   Cardiovascular: Negative for chest pain, palpitations, orthopnea and leg swelling.  Gastrointestinal: Negative for abdominal pain, blood in stool, constipation, diarrhea, heartburn, melena, nausea and vomiting.  Genitourinary: Negative for dysuria, frequency and urgency.  Musculoskeletal: Negative for back pain and joint pain.  Skin: Negative.  Negative for itching and rash.  Neurological: Negative for dizziness, tingling, focal weakness, weakness and headaches.  Endo/Heme/Allergies: Does not bruise/bleed easily.  Psychiatric/Behavioral: Negative for depression. The patient is not nervous/anxious and does not have insomnia.      MEDICAL HISTORY:  Past Medical History:  Diagnosis Date  . GERD (gastroesophageal reflux disease)   . Hypertension   . Thyroid disease     SURGICAL  HISTORY: Past Surgical History:  Procedure Laterality Date  . COLONOSCOPY WITH PROPOFOL N/A 04/17/2019   Procedure: COLONOSCOPY WITH PROPOFOL;  Surgeon: Midge MiniumWohl, Darren, MD;  [facility];  Service: Endoscopy;  Laterality: N/A;  . ESOPHAGOGASTRODUODENOSCOPY (EGD) WITH PROPOFOL N/A 04/17/2019   Procedure: ESOPHAGOGASTRODUODENOSCOPY (EGD) WITH PROPOFOL;  Surgeon: Midge MiniumWohl, Darren, MD;  Location: [facility] ENDOSCOPY;  Service: Endoscopy;  Laterality: N/A;  . GIVENS CAPSULE STUDY  05/2019   negative    SOCIAL HISTORY: Social History   Socioeconomic History  . Marital status: Married    Spouse name: Not on file  . Number of children: Not on file  . Years of education: Not on file  . Highest education level: Not on file  Occupational History  . Not on file  Social Needs  . Financial resource strain: Not on file  . Food insecurity    Worry: Not on file    Inability: Not on file  . Transportation needs    Medical: Not on file    Non-medical: Not on file  Tobacco Use  . Smoking status: Never Smoker  . Smokeless tobacco: Never Used  Substance and Sexual Activity  . Alcohol use: Yes    Alcohol/week: 70.0 standard drinks    Types: 70 Cans of beer per week    Comment: "10 beers a day"  . Drug use: Yes    Types: Marijuana    Comment: "rare-once a year"   . Sexual activity: Not on file  Lifestyle  . Physical activity    Days per week: Not on file    Minutes per session: Not on file  . Stress: Not on file  Relationships  . Social Musicianconnections    Talks on  phone: Not on file    Gets together: Not on file    Attends religious service: Not on file    Active member of club or organization: Not on file    Attends meetings of clubs or organizations: Not on file    Relationship status: Not on file  . Intimate partner violence    Fear of current or ex partner: Not on file    Emotionally abused: Not on file    Physically abused: Not on file    Forced sexual activity: Not on file  Other  Topics Concern  . Not on file  Social History Narrative   Smoker; alcohol use.  Lives with his wife at home.    FAMILY HISTORY: Family History  Problem Relation Age of Onset  . Hypertension Mother   . Stroke Father   . Liver cancer Maternal Uncle     ALLERGIES:  has No Known Allergies.  MEDICATIONS:  Current Outpatient Medications  Medication Sig Dispense Refill  . FEROSUL 325 (65 Fe) MG tablet TAKE 1 TABLET BY MOUTH TWICE DAILY FOR ANEMIA    . fluticasone (FLONASE) 50 MCG/ACT nasal spray USE 2 SPRAY(S) IN EACH NOSTRIL ONCE DAILY    . levothyroxine (SYNTHROID) 75 MCG tablet TAKE 1 TABLET BY MOUTH ONCE DAILY FOR LOW THYROID    . metoprolol tartrate (LOPRESSOR) 25 MG tablet TAKE 3 TABLETS BY MOUTH TWICE DAILY FOR HIGH BLOOD PRESSURE    . ELIQUIS 5 MG TABS tablet Take 5 mg by mouth 2 (two) times daily.    Marland Kitchen omeprazole (PRILOSEC) 20 MG capsule Take 1 capsule (20 mg total) by mouth 2 (two) times daily before a meal for 14 days, THEN 1 capsule (20 mg total) daily. (Patient not taking: Reported on 06/05/2019) 90 capsule 0   No current facility-administered medications for this visit.       Marland Kitchen  PHYSICAL EXAMINATION: ECOG PERFORMANCE STATUS: 0 - Asymptomatic  Vitals:   06/05/19 1453 06/05/19 1454  BP: (!) 157/104 (!) 163/113  Resp:     Filed Weights   06/05/19 1442  Weight: 196 lb 11.2 oz (89.2 kg)    Physical Exam  Constitutional: He is oriented to person, place, and time and well-developed, well-nourished, and in no distress.  HENT:  Head: Normocephalic and atraumatic.  Mouth/Throat: Oropharynx is clear and moist. No oropharyngeal exudate.  Eyes: Pupils are equal, round, and reactive to light.  Neck: Normal range of motion. Neck supple.  Cardiovascular: Normal rate.  Irregular irregular rhythm no murmurs  Pulmonary/Chest: No respiratory distress. He has no wheezes.  Abdominal: Soft. Bowel sounds are normal. He exhibits no distension and no mass. There is no abdominal  tenderness. There is no rebound and no guarding.  Musculoskeletal: Normal range of motion.        General: No tenderness or edema.  Neurological: He is alert and oriented to person, place, and time.  Skin: Skin is warm.  Psychiatric: Affect normal.     LABORATORY DATA:  I have reviewed the data as listed Lab Results  Component Value Date   WBC 5.6 06/05/2019   HGB 15.4 06/05/2019   HCT 47.0 06/05/2019   MCV 84.2 06/05/2019   PLT 180 06/05/2019   Recent Labs    09/21/18 1026 04/13/19 1156  NA 134* 138  K 3.6 5.1  CL 103 101  CO2 23 17*  GLUCOSE 106* 77  BUN 8 8  CREATININE 0.87 0.89  CALCIUM 8.0* 9.1  GFRNONAA >60 100  GFRAA >60 115  PROT 6.9 7.0  ALBUMIN 3.4* 4.2  AST 32 57*  ALT 26 49*  ALKPHOS 45 55  BILITOT 1.6* 1.8*  BILIDIR 0.3*  --   IBILI 1.3*  --     RADIOGRAPHIC STUDIES: I have personally reviewed the radiological images as listed and agreed with the findings in the report. [place] Abdomen Limited Ruq  Result Date: 05/07/2019 CLINICAL DATA:  Elevated liver enzymes EXAM: ULTRASOUND ABDOMEN LIMITED RIGHT UPPER QUADRANT COMPARISON:  None. FINDINGS: Gallbladder: No gallstones or wall thickening visualized. There is no pericholecystic fluid. No sonographic Murphy sign noted by sonographer. Common bile duct: Diameter: 4 mm. No intrahepatic or extrahepatic biliary duct dilatation. Liver: No focal lesion identified. Liver echogenicity is increased diffusely. Liver contour appears smooth. Portal vein is patent on color Doppler imaging with normal direction of blood flow towards the liver. IMPRESSION: Diffuse increase in liver echogenicity, a finding indicative of hepatic steatosis. There may be a degree of underlying parenchymal liver disease. While no focal liver lesions are evident on this study, it must be cautioned that the sensitivity of ultrasound for detection of focal liver lesions is diminished in this circumstance. Study otherwise unremarkable. Electronically Signed    By: Bretta BangWilliam  Woodruff III M.D.   On: 05/07/2019 13:50    ASSESSMENT & PLAN:   Iron deficiency anemia due to chronic blood loss #Iron deficient anemia-status post PO iron-  most recent hemoglobin 13.  Continue p.o. iron.  Iron studies not suggestive of any iron deficient at this time.  #Etiology iron deficiency anemia is unclear-EGD colonoscopy unrevealing; capsule study negative.  Follow-up planned with GI tomorrow.  # BP Elevated-currently 160/110 as per patient.  At home 140s-/90s [facility][wife RN]-recommend keeping a log of blood pressures/follow-up with cardiology/PCP.  # A.fib- off eliquis given recent anemia; ok resume Eliquis from Hematology standpoint.  Recommend follow-up with Dr.Kowalski.   # DISPOSITION: # follow up in 3 months MD; labs-- cbc/cmp; DrB  All questions were answered. The patient knows to call the clinic with any problems, questions or concerns.    Earna CoderGovinda R , MD 06/05/2019 8:49 PM

## 2019-06-05 NOTE — Assessment & Plan Note (Addendum)
#  Iron deficient anemia-status post PO iron-  most recent hemoglobin 13.  Continue p.o. iron.  Iron studies not suggestive of any iron deficient at this time.  #Etiology iron deficiency anemia is unclear-EGD colonoscopy unrevealing; capsule study negative.  Recommend CT scan of the abdomen pelvis with contrast; UA.  Follow-up planned with GI tomorrow.  # BP Elevated-currently 160/110 as per patient.  At home 140s-/90s [facility] RN]-recommend keeping a log of blood pressures/follow-up with cardiology/PCP.  # A.fib- off eliquis given recent anemia; ok resume Eliquis from Hematology standpoint.  Recommend follow-up with Dr.Kowalski.   # DISPOSITION: # follow up in 3 months MD; labs-- cbc/cmp; DrB

## 2019-06-06 ENCOUNTER — Telehealth: Payer: Self-pay | Admitting: Internal Medicine

## 2019-06-06 NOTE — Addendum Note (Signed)
Addended by: Sabino Gasser on: 06/06/2019 08:43 AM   Modules accepted: Orders

## 2019-06-06 NOTE — Telephone Encounter (Signed)
Left a message for the patient to call us back to discuss regarding work-up including CT scan for his anemia.

## 2019-06-07 NOTE — Telephone Encounter (Signed)
Spoke with patient via telephone. Patient made aware of the plan of care and agreeable to obtain a ct scan instructions for prep pick up and nothing by mouth 4 hours prior to the procedure reviewed with patient. Scan will be at the outpatient imaging location. Pt aware. Lab only for u/a apt made for next wed.

## 2019-06-11 NOTE — Telephone Encounter (Signed)
Spoke to pt re: CT scan; agrees with plan.

## 2019-06-13 ENCOUNTER — Inpatient Hospital Stay: Payer: Medicaid Other | Attending: Internal Medicine

## 2019-06-13 ENCOUNTER — Other Ambulatory Visit: Payer: Self-pay

## 2019-06-13 ENCOUNTER — Encounter: Payer: Self-pay | Admitting: Gastroenterology

## 2019-06-13 ENCOUNTER — Ambulatory Visit: Payer: Medicaid Other | Admitting: Gastroenterology

## 2019-06-13 VITALS — BP 177/116 | HR 87 | Ht 68.0 in | Wt 199.2 lb

## 2019-06-13 DIAGNOSIS — D509 Iron deficiency anemia, unspecified: Secondary | ICD-10-CM

## 2019-06-13 DIAGNOSIS — K2270 Barrett's esophagus without dysplasia: Secondary | ICD-10-CM | POA: Diagnosis not present

## 2019-06-13 DIAGNOSIS — Z8619 Personal history of other infectious and parasitic diseases: Secondary | ICD-10-CM | POA: Diagnosis not present

## 2019-06-13 DIAGNOSIS — D500 Iron deficiency anemia secondary to blood loss (chronic): Secondary | ICD-10-CM | POA: Insufficient documentation

## 2019-06-13 LAB — URINALYSIS, COMPLETE (UACMP) WITH MICROSCOPIC
Bacteria, UA: NONE SEEN
Bilirubin Urine: NEGATIVE
Glucose, UA: NEGATIVE mg/dL
Hgb urine dipstick: NEGATIVE
Ketones, ur: NEGATIVE mg/dL
Leukocytes,Ua: NEGATIVE
Nitrite: NEGATIVE
Protein, ur: NEGATIVE mg/dL
Specific Gravity, Urine: 1.002 — ABNORMAL LOW (ref 1.005–1.030)
Squamous Epithelial / HPF: NONE SEEN (ref 0–5)
WBC, UA: NONE SEEN WBC/hpf (ref 0–5)
pH: 6 (ref 5.0–8.0)

## 2019-06-13 NOTE — Progress Notes (Signed)
Dean BouillonVarnita Aspasia Rude, MD [address]  [address]  [place], [place] [address]  Main: [phone]  Fax: [phone]   Primary Care Physician: Dean Anderson, Dean M, MD  CC: Follow up for anemia  HPI: Dean Anderson is a 53 y.o. male previously seen in April 2020 due to iron deficiency anemia anemia and intermittent bright red blood per rectum on Eliquis.  May 2020 Upper endoscopy , Dr. Servando SnareWohl 6 cm long segment Barrett's, biopsied every 2 cm Gastric erythema Hiatal hernia  Colonoscopy, Dr. Servando SnareWohl, with nonbleeding internal hemorrhoids, otherwise normal  Pathology showed H. 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 Leone PayorGessner with no etiology of iron deficiency anemia on small bowel exam  Patient is seeing hematology for iron deficiency anemia and they have scheduled a CT scan.  Hemoglobin is now normal with replacement with improvement in ferritin  He also had mildly elevated liver enzymes and reported daily alcohol use.  He was advised to abstain from alcohol use.  Further viral and autoimmune hepatitis work-up was negative.  Right upper quadrant ultrasound indicated hepatic steatosis with no focal liver lesions  The patient denies abdominal or flank pain, anorexia, nausea or vomiting, dysphagia, heartburn, indigestion, change in bowel habits or black or bloody stools or weight loss.  Continues to drink 8-10 beers a day  Current Outpatient Medications  Medication Sig Dispense Refill  . ELIQUIS 5 MG TABS tablet Take 5 mg by mouth 2 (two) times daily.    . FEROSUL 325 (65 Fe) MG tablet TAKE 1 TABLET BY MOUTH TWICE DAILY FOR ANEMIA    . fluticasone (FLONASE) 50 MCG/ACT nasal spray USE 2 SPRAY(S) IN EACH NOSTRIL ONCE DAILY    . levothyroxine (SYNTHROID) 75 MCG tablet TAKE 1 TABLET BY MOUTH ONCE DAILY FOR LOW THYROID    . metoprolol tartrate (LOPRESSOR) 25 MG tablet TAKE 3 TABLETS BY MOUTH TWICE DAILY FOR HIGH BLOOD PRESSURE    .  omeprazole (PRILOSEC) 20 MG capsule Take 1 capsule (20 mg total) by mouth 2 (two) times daily before a meal for 14 days, THEN 1 capsule (20 mg total) daily. (Patient not taking: Reported on 06/05/2019) 90 capsule 0   No current facility-administered medications for this visit.     Allergies as of 06/13/2019  . (No Known Allergies)    ROS:  General: Negative for anorexia, weight loss, fever, chills, fatigue, weakness. ENT: Negative for hoarseness, difficulty swallowing , nasal congestion. CV: Negative for chest pain, angina, palpitations, dyspnea on exertion, peripheral edema.  Respiratory: Negative for dyspnea at rest, dyspnea on exertion, cough, sputum, wheezing.  GI: See history of present illness. GU:  Negative for dysuria, hematuria, urinary incontinence, urinary frequency, nocturnal urination.  Endo: Negative for unusual weight change.    Physical Examination:  Vitals:   06/13/19 1305  BP: (!) 177/116  Pulse: 87  Weight: 199 lb 3.2 oz (90.4 kg)  Height: 5\' 8"  (1.727 Anderson)     General: Well-nourished, well-developed in no acute distress.  Eyes: No icterus. Conjunctivae pink. Mouth: Oropharyngeal mucosa moist and pink , no lesions erythema or exudate. Neck: Supple, Trachea midline Abdomen: Bowel sounds are normal, nontender, nondistended, no hepatosplenomegaly or masses, no abdominal bruits or hernia , no rebound or guarding.   Extremities: No lower extremity edema. No clubbing or deformities. Neuro: Alert and oriented x 3.  Grossly intact. Skin: Warm and dry, no jaundice.   Psych: Alert and cooperative, normal mood and affect.   Labs:  CMP     Component Value Date/Time   NA 138 04/13/2019 1156   K 5.1 04/13/2019 1156   CL 101 04/13/2019 1156   CO2 17 (L) 04/13/2019 1156   GLUCOSE 77 04/13/2019 1156   GLUCOSE 106 (H) 09/21/2018 1026   BUN 8 04/13/2019 1156   CREATININE 0.89 04/13/2019 1156   CALCIUM 9.1 04/13/2019 1156   PROT 7.0 04/13/2019 1156   ALBUMIN 4.2  04/13/2019 1156   AST 57 (H) 04/13/2019 1156   ALT 49 (H) 04/13/2019 1156   ALKPHOS 55 04/13/2019 1156   BILITOT 1.8 (H) 04/13/2019 1156   GFRNONAA 100 04/13/2019 1156   GFRAA 115 04/13/2019 1156   Lab Results  Component Value Date   WBC 5.6 06/05/2019   HGB 15.4 06/05/2019   HCT 47.0 06/05/2019   MCV 84.2 06/05/2019   PLT 180 06/05/2019    Imaging Studies: No results found.  Assessment and Plan:   Dean Anderson is a 53 y.o. y/o male with iron deficiency anemia, A. fib on Eliquis, now normal hemoglobin with iron replacement and no episodes of GI bleeding, long segment Barrett's esophagus, recently treated H. pylori here for follow-up  -Anemia Resolved with iron replacement Continue with CT planned as per hematology No source of anemia evident on EGD, colonoscopy and small bowel capsule study Continue follow-up with hematology Continue iron placement as per hematology  -Barrett's esophagus Patient educated extensively on acid reflux lifestyle modification, including buying a bed wedge, not eating 3 hrs before bedtime, diet modifications, and handout given for the same.   Patient also will need omeprazole 20 mg once daily indefinitely as per guidelines and recommendations due to long segment Barrett's esophagus  (Risks of PPI use were discussed with patient including bone loss, C. Diff diarrhea, pneumonia, infections, CKD, electrolyte abnormalities.  Pt. Verbalizes understanding and chooses to continue the medication.)  Repeat EGD recommended in 2 years for surveillance biopsies  -History of H. Pylori Patient will need eradication testing I have asked him to hold his Prilosec for 2 weeks and then come back in for a breath test to confirm eradication He is otherwise not having any symptoms  -Elevated liver enzymes Finding of fatty liver on imaging discussed with patient Diet, weight loss, and exercise encouraged along with avoiding hepatotoxic drugs including alcohol Risk  of progression to cirrhosis if above measures are not instituted were discussed as well, and patient verbalized understanding  Patient advised to abstain from all alcohol use and high risk of developing cirrhosis with ongoing alcohol use discussed in detail including risk of liver failure and he verbalized understanding.  We will repeat liver enzymes today      Dr [name]

## 2019-06-14 LAB — HEPATIC FUNCTION PANEL
ALT: 82 IU/L — ABNORMAL HIGH (ref 0–44)
AST: 89 IU/L — ABNORMAL HIGH (ref 0–40)
Albumin: 4 g/dL (ref 3.8–4.9)
Alkaline Phosphatase: 65 IU/L (ref 39–117)
Bilirubin Total: 1.4 mg/dL — ABNORMAL HIGH (ref 0.0–1.2)
Bilirubin, Direct: 0.51 mg/dL — ABNORMAL HIGH (ref 0.00–0.40)
Total Protein: 6.7 g/dL (ref 6.0–8.5)

## 2019-06-15 ENCOUNTER — Ambulatory Visit
Admission: RE | Admit: 2019-06-15 | Discharge: 2019-06-15 | Disposition: A | Discharge status: Home / Self Care | Payer: Medicaid Other | Source: Ambulatory Visit | Attending: Internal Medicine | Admitting: Internal Medicine

## 2019-06-15 ENCOUNTER — Other Ambulatory Visit: Payer: Self-pay

## 2019-06-15 DIAGNOSIS — D500 Iron deficiency anemia secondary to blood loss (chronic): Secondary | ICD-10-CM | POA: Insufficient documentation

## 2019-06-15 DIAGNOSIS — D509 Iron deficiency anemia, unspecified: Secondary | ICD-10-CM | POA: Diagnosis not present

## 2019-06-15 DIAGNOSIS — K921 Melena: Secondary | ICD-10-CM | POA: Diagnosis not present

## 2019-06-15 LAB — POCT I-STAT CREATININE: Creatinine, Ser: 0.8 mg/dL (ref 0.61–1.24)

## 2019-06-15 MED ORDER — IOHEXOL 300 MG/ML  SOLN
100.0000 mL | Freq: Once | INTRAMUSCULAR | Status: AC | PRN
  Administered 2019-06-15: 100 mL via INTRAVENOUS

## 2019-07-30 ENCOUNTER — Other Ambulatory Visit: Payer: Self-pay

## 2019-07-30 ENCOUNTER — Telehealth: Payer: Self-pay

## 2019-07-30 DIAGNOSIS — D509 Iron deficiency anemia, unspecified: Secondary | ICD-10-CM

## 2019-07-30 NOTE — Telephone Encounter (Signed)
Can you please refer patient. It will not let me do referrals till I take the class tomorrow.

## 2019-07-30 NOTE — Telephone Encounter (Signed)
-----   Message from Virgel Manifold, MD sent at 07/24/2019  3:04 PM EDT ----- We can refer him to surgery to evaluate if he is a candidate for hiatal hernia surgery. His upper endoscopy is not reporting any ulcers or lesions within the hiatal hernia that require urgent intervention.  Caryl Pina, please refer him to Dr. Dahlia Byes for evaluation of hiatal hernia.  ----- Message ----- From: Cammie Sickle, MD Sent: 06/26/2019   8:09 AM EDT To: Virgel Manifold, MD  Dr. Christella Scheuermann; wanted to bring to attention the Hetal hernia-as a potential cause of iron deficient anemia/and if that needs any interventions.  ------------------------------------------------------ Mr. Ihde CT scan does not show any obvious any reason for your anemia.  However does show a large hiatal hernia- which could cause reflux; and rarely a cause of iron deficient anemia.  I will reach out to your stomach doctor to see if they have any further recommendations.  Call us with any questions/Dr. B/MyChart message

## 2019-07-30 NOTE — Telephone Encounter (Signed)
Referral sent to [facility].

## 2019-08-22 ENCOUNTER — Other Ambulatory Visit: Payer: Self-pay

## 2019-08-22 ENCOUNTER — Ambulatory Visit (INDEPENDENT_AMBULATORY_CARE_PROVIDER_SITE_OTHER): Payer: Medicaid Other | Admitting: Surgery

## 2019-08-22 ENCOUNTER — Encounter: Payer: Self-pay | Admitting: Surgery

## 2019-08-22 VITALS — BP 174/95 | HR 86 | Temp 97.5°F | Ht 68.0 in | Wt 197.0 lb

## 2019-08-22 DIAGNOSIS — K449 Diaphragmatic hernia without obstruction or gangrene: Secondary | ICD-10-CM | POA: Diagnosis not present

## 2019-08-22 NOTE — Patient Instructions (Addendum)
Have your blood work done at [facility], [facility] entrance in the next 1-2 weeks.   You are scheduled for a Barium Swallow at [facility] on 08/30/19 at 9:00 am, please arrive by 8:45 am and nothing to eat or drink for 3 hours prior. Please enter in through the [place] entrance.   Follow up here in 3 weeks with Dr Dahlia Byes.

## 2019-08-24 ENCOUNTER — Encounter: Payer: Self-pay | Admitting: Surgery

## 2019-08-24 NOTE — Progress Notes (Signed)
Patient ID: Dean Anderson, male   DOB: 1966-02-05, 53 y.o.   MRN: [id number]  HPI Dean Anderson is a 53 y.o. male seen in consultation at the request of Dr. Allen Norris for paraesophageal hernia.  He does have significant history of anemia as well as lower GI bleed.  He underwent an upper GI scope that I have personally reviewed showing evidence of a long segment Barrett's as well as a hiatal hernia.  Final pathology showed  metaplasia without dysplasia. He does drink daily.  He did have CT scan of the abdomen that I have personally reviewed showing evidence of a large hiatal hernia with an inverted intrathoracic stomach.  He did underwent a capsule endoscopy without any specific pathology. He does report some reflux that is intermittent worsening when he lays flat.  No evidence of dysphagia. He also had an ultrasound showing no evidence of cirrhosis.  He did have elevation of the LFTs with total bilirubin of 1.4 AST of 89 and ALT of 82.  Latest CBC was normal with normal platelets and hemoglobin. He is able to perform more than 4 METS of activity without any shortness of breath or chest pain.     HPI  Past Medical History:  Diagnosis Date  . Anemia   . Atrial fibrillation ([place])   . GERD (gastroesophageal reflux disease)   . Hypertension   . Thyroid disease     Past Surgical History:  Procedure Laterality Date  . COLONOSCOPY WITH PROPOFOL N/A 04/17/2019   Procedure: COLONOSCOPY WITH PROPOFOL;  Surgeon: Lucilla Lame, MD;  Location: [facility] ENDOSCOPY;  Service: Endoscopy;  Laterality: N/A;  . ESOPHAGOGASTRODUODENOSCOPY (EGD) WITH PROPOFOL N/A 04/17/2019   Procedure: ESOPHAGOGASTRODUODENOSCOPY (EGD) WITH PROPOFOL;  Surgeon: Lucilla Lame, MD;  Location: [facility] ENDOSCOPY;  Service: Endoscopy;  Laterality: N/A;  . GIVENS CAPSULE STUDY  05/2019   negative    Family History  Problem Relation Age of Onset  . Hypertension Mother   . Stroke Father   . Liver cancer Maternal Uncle     Social  History Social History   Tobacco Use  . Smoking status: Never Smoker  . Smokeless tobacco: Never Used  Substance Use Topics  . Alcohol use: Yes    Alcohol/week: 70.0 standard drinks    Types: 70 Cans of beer per week    Comment: "10 beers a day"  . Drug use: Yes    Types: Marijuana    Comment: "rare-once a year"     No Known Allergies  Current Outpatient Medications  Medication Sig Dispense Refill  . FEROSUL 325 (65 Fe) MG tablet TAKE 1 TABLET BY MOUTH TWICE DAILY FOR ANEMIA    . fluticasone (FLONASE) 50 MCG/ACT nasal spray USE 2 SPRAY(S) IN EACH NOSTRIL ONCE DAILY    . levothyroxine (SYNTHROID) 75 MCG tablet TAKE 1 TABLET BY MOUTH ONCE DAILY FOR LOW THYROID    . metoprolol tartrate (LOPRESSOR) 25 MG tablet TAKE 3 TABLETS BY MOUTH TWICE DAILY FOR HIGH BLOOD PRESSURE    . ELIQUIS 5 MG TABS tablet Take 5 mg by mouth 2 (two) times daily.    Marland Kitchen omeprazole (PRILOSEC) 20 MG capsule Take 1 capsule (20 mg total) by mouth 2 (two) times daily before a meal for 14 days, THEN 1 capsule (20 mg total) daily. (Patient not taking: Reported on 06/13/2019) 90 capsule 0   No current facility-administered medications for this visit.      Review of Systems Full ROS  was asked and was negative except for the  information on the HPI  Physical Exam Blood pressure (!) 174/95, pulse 86, temperature (!) 97.5 F (36.4 C), height 5\' 8"  (1.727 m), weight 197 lb (89.4 kg), SpO2 100 %. CONSTITUTIONAL: NAD EYES: Pupils are equal, round, , Sclera are non-icteric. EARS, NOSE, MOUTH AND THROAT:  The oral mucosa is pink and moist. Hearing is intact to voice. LYMPH NODES:  Lymph nodes in the neck are normal. RESPIRATORY:  Lungs are clear. There is normal respiratory effort, with equal breath sounds bilaterally, and without pathologic use of accessory muscles. CARDIOVASCULAR: Heart is regular without murmurs, gallops, or rubs. GI: The abdomen is soft, nontender, and nondistended. There are no palpable masses. There  is no hepatosplenomegaly. There are normal bowel sounds in all quadrants. GU: Rectal deferred.   MUSCULOSKELETAL: Normal muscle strength and tone. No cyanosis or edema.   SKIN: Turgor is good and there are no pathologic skin lesions or ulcers. NEUROLOGIC: Motor and sensation is grossly normal. Cranial nerves are grossly intact. PSYCH:  Oriented to person, place and time. Affect is normal.  Data Reviewed  I have personally reviewed the patient's imaging, laboratory findings and medical records.    Assessment/Plan 53 year old male with a large type III hiatal hernia.  He is symptomatic from this and I am concerned about his progression of Barrett's esophagus..  I do think that he will benefit from a robotic approach of his hiatal hernia with a fundoplication to prevent further Barrett's.  We will start a work-up to include swallow study.  There is no need for emergent surgical intervention at this time. We will also repeat his LFTs, INR and lab work.  I have also discussed with her about cessation of alcohol intake. I will see him back in a couple weeks and will talk more about surgical intervention for this large hiatal hernia. A copy of this report was sent to the referring provider    Sterling Bigiego Osaze Hubbert, MD FACS General Surgeon 08/24/2019, 2:59 PM

## 2019-08-30 ENCOUNTER — Ambulatory Visit: Payer: Medicaid Other | Attending: Surgery

## 2019-08-31 ENCOUNTER — Other Ambulatory Visit
Admission: RE | Admit: 2019-08-31 | Discharge: 2019-08-31 | Disposition: A | Discharge status: Home / Self Care | Payer: Medicaid Other | Source: Ambulatory Visit | Attending: Surgery | Admitting: Surgery

## 2019-08-31 DIAGNOSIS — K449 Diaphragmatic hernia without obstruction or gangrene: Secondary | ICD-10-CM | POA: Diagnosis not present

## 2019-08-31 LAB — CBC WITH DIFFERENTIAL/PLATELET
Abs Immature Granulocytes: 0.03 10*3/uL (ref 0.00–0.07)
Basophils Absolute: 0 10*3/uL (ref 0.0–0.1)
Basophils Relative: 1 %
Eosinophils Absolute: 0.2 10*3/uL (ref 0.0–0.5)
Eosinophils Relative: 3 %
HCT: 44.6 % (ref 39.0–52.0)
Hemoglobin: 15.2 g/dL (ref 13.0–17.0)
Immature Granulocytes: 0 %
Lymphocytes Relative: 16 %
Lymphs Abs: 1.1 10*3/uL (ref 0.7–4.0)
MCH: 32.2 pg (ref 26.0–34.0)
MCHC: 34.1 g/dL (ref 30.0–36.0)
MCV: 94.5 fL (ref 80.0–100.0)
Monocytes Absolute: 0.9 10*3/uL (ref 0.1–1.0)
Monocytes Relative: 13 %
Neutro Abs: 4.7 10*3/uL (ref 1.7–7.7)
Neutrophils Relative %: 67 %
Platelets: 261 10*3/uL (ref 150–400)
RBC: 4.72 MIL/uL (ref 4.22–5.81)
RDW: 12.6 % (ref 11.5–15.5)
WBC: 7 10*3/uL (ref 4.0–10.5)
nRBC: 0 % (ref 0.0–0.2)

## 2019-08-31 LAB — COMPREHENSIVE METABOLIC PANEL
ALT: 44 U/L (ref 0–44)
AST: 41 U/L (ref 15–41)
Albumin: 3.9 g/dL (ref 3.5–5.0)
Alkaline Phosphatase: 56 U/L (ref 38–126)
Anion gap: 9 (ref 5–15)
BUN: 10 mg/dL (ref 6–20)
CO2: 27 mmol/L (ref 22–32)
Calcium: 9.5 mg/dL (ref 8.9–10.3)
Chloride: 99 mmol/L (ref 98–111)
Creatinine, Ser: 0.83 mg/dL (ref 0.61–1.24)
GFR calc Af Amer: >60 mL/min (ref >60)
GFR calc non Af Amer: >60 mL/min (ref >60)
Glucose, Bld: 100 mg/dL — ABNORMAL HIGH (ref 70–99)
Potassium: 4.6 mmol/L (ref 3.5–5.1)
Sodium: 135 mmol/L (ref 135–145)
Total Bilirubin: 2.4 mg/dL — ABNORMAL HIGH (ref 0.3–1.2)
Total Protein: 7.5 g/dL (ref 6.5–8.1)

## 2019-08-31 LAB — PROTIME-INR
INR: 1.1 (ref 0.8–1.2)
Prothrombin Time: 13.9 seconds (ref 11.4–15.2)

## 2019-09-03 ENCOUNTER — Telehealth: Payer: Self-pay

## 2019-09-03 NOTE — Progress Notes (Signed)
Patient is coming in for follow up he is doing well no complaints  

## 2019-09-03 NOTE — Telephone Encounter (Signed)
Patient notified per Dr Dahlia Byes that his Bilirubin is still a little too high. He will follow up with him as scheduled.

## 2019-09-04 ENCOUNTER — Other Ambulatory Visit: Payer: Self-pay

## 2019-09-04 ENCOUNTER — Inpatient Hospital Stay (HOSPITAL_BASED_OUTPATIENT_CLINIC_OR_DEPARTMENT_OTHER): Payer: Medicaid Other | Admitting: Internal Medicine

## 2019-09-04 ENCOUNTER — Inpatient Hospital Stay: Payer: Medicaid Other | Attending: Internal Medicine

## 2019-09-04 DIAGNOSIS — I4891 Unspecified atrial fibrillation: Secondary | ICD-10-CM | POA: Diagnosis not present

## 2019-09-04 DIAGNOSIS — Z7901 Long term (current) use of anticoagulants: Secondary | ICD-10-CM | POA: Diagnosis not present

## 2019-09-04 DIAGNOSIS — Z79899 Other long term (current) drug therapy: Secondary | ICD-10-CM | POA: Diagnosis not present

## 2019-09-04 DIAGNOSIS — E079 Disorder of thyroid, unspecified: Secondary | ICD-10-CM | POA: Insufficient documentation

## 2019-09-04 DIAGNOSIS — N529 Male erectile dysfunction, unspecified: Secondary | ICD-10-CM | POA: Insufficient documentation

## 2019-09-04 DIAGNOSIS — D509 Iron deficiency anemia, unspecified: Secondary | ICD-10-CM | POA: Diagnosis not present

## 2019-09-04 DIAGNOSIS — I10 Essential (primary) hypertension: Secondary | ICD-10-CM | POA: Insufficient documentation

## 2019-09-04 DIAGNOSIS — D500 Iron deficiency anemia secondary to blood loss (chronic): Secondary | ICD-10-CM

## 2019-09-04 LAB — CBC WITH DIFFERENTIAL/PLATELET
Abs Immature Granulocytes: 0.04 10*3/uL (ref 0.00–0.07)
Basophils Absolute: 0 10*3/uL (ref 0.0–0.1)
Basophils Relative: 1 %
Eosinophils Absolute: 0.2 10*3/uL (ref 0.0–0.5)
Eosinophils Relative: 3 %
HCT: 42.7 % (ref 39.0–52.0)
Hemoglobin: 14.6 g/dL (ref 13.0–17.0)
Immature Granulocytes: 1 %
Lymphocytes Relative: 15 %
Lymphs Abs: 1.1 10*3/uL (ref 0.7–4.0)
MCH: 32.3 pg (ref 26.0–34.0)
MCHC: 34.2 g/dL (ref 30.0–36.0)
MCV: 94.5 fL (ref 80.0–100.0)
Monocytes Absolute: 0.7 10*3/uL (ref 0.1–1.0)
Monocytes Relative: 10 %
Neutro Abs: 5 10*3/uL (ref 1.7–7.7)
Neutrophils Relative %: 70 %
Platelets: 215 10*3/uL (ref 150–400)
RBC: 4.52 MIL/uL (ref 4.22–5.81)
RDW: 12.6 % (ref 11.5–15.5)
WBC: 7 10*3/uL (ref 4.0–10.5)
nRBC: 0 % (ref 0.0–0.2)

## 2019-09-04 LAB — COMPREHENSIVE METABOLIC PANEL
ALT: 36 U/L (ref 0–44)
AST: 29 U/L (ref 15–41)
Albumin: 3.8 g/dL (ref 3.5–5.0)
Alkaline Phosphatase: 49 U/L (ref 38–126)
Anion gap: 10 (ref 5–15)
BUN: 8 mg/dL (ref 6–20)
CO2: 24 mmol/L (ref 22–32)
Calcium: 8.9 mg/dL (ref 8.9–10.3)
Chloride: 101 mmol/L (ref 98–111)
Creatinine, Ser: 0.77 mg/dL (ref 0.61–1.24)
GFR calc Af Amer: >60 mL/min (ref >60)
GFR calc non Af Amer: >60 mL/min (ref >60)
Glucose, Bld: 79 mg/dL (ref 70–99)
Potassium: 3.9 mmol/L (ref 3.5–5.1)
Sodium: 135 mmol/L (ref 135–145)
Total Bilirubin: 1.4 mg/dL — ABNORMAL HIGH (ref 0.3–1.2)
Total Protein: 7.2 g/dL (ref 6.5–8.1)

## 2019-09-04 NOTE — Assessment & Plan Note (Addendum)
#  Iron deficient anemia-status post PO iron-hemoglobin is 14.  Continue p.o. iron/recommend every other day.  #Etiology iron deficiency anemia is unclear-EGD colonoscopy unrevealing; capsule study negative.  CT scan shows a large hiatal hernia-question cause of his iron deficiency.  Discussed the possible risk of Barrett's/and appropriate surgical evaluation with Dr. Perrin Maltese.  # A.fib-on Eliquis.  Stable hemoglobin  #Erectile dysfunction-recommend evaluation with urology; defer to PCP for referrals.  # DISPOSITION: # follow up as needed- Dr.B

## 2019-09-04 NOTE — Progress Notes (Signed)
[place] NOTE  Patient Care Team: Marguerita Merles, MD as PCP - General (Family Medicine)  CHIEF COMPLAINTS/PURPOSE OF CONSULTATION: Iron deficiency anemia  # SEP 2019- chronic microcytic anemia-~9; May 2020- EGD colonoscopy; capsule study [? hemorroidal]- NEG [Dr.Wohl]; CT scan abdomen pelvis-large hiatal hernia [Dr. Pavone]  # A.fib  [Dr.Kowalski]; off Eliquis sec to #1  # Alcohol use   Oncology History   No history exists.     HISTORY OF PRESENTING ILLNESS:  Dean Anderson 53 y.o.  male history of iron deficiency anemia of unclear etiology is here for follow-up/review the results CT scan  In the interim patient was evaluated by surgery-for his large hiatal hernia.  Patient denies any blood in stools or black or stools.  His appetite is good.  Denies any nausea vomiting.  He denies any significant heartburn.  Patient taking iron pills 1 or 2 a week.  He denies any shortness of breath or cough.  Patient complains of erectile dysfunction.  Review of Systems  Constitutional: Negative for chills, diaphoresis, fever, malaise/fatigue and weight loss.  HENT: Negative for nosebleeds and sore throat.   Eyes: Negative for double vision.  Respiratory: Negative for cough, hemoptysis, sputum production, shortness of breath and wheezing.   Cardiovascular: Negative for chest pain, palpitations, orthopnea and leg swelling.  Gastrointestinal: Negative for abdominal pain, blood in stool, constipation, diarrhea, heartburn, melena, nausea and vomiting.  Genitourinary: Negative for dysuria, frequency and urgency.  Musculoskeletal: Negative for back pain and joint pain.  Skin: Negative.  Negative for itching and rash.  Neurological: Negative for dizziness, tingling, focal weakness, weakness and headaches.  Endo/Heme/Allergies: Does not bruise/bleed easily.  Psychiatric/Behavioral: Negative for depression. The patient is not nervous/anxious and does not have insomnia.      MEDICAL HISTORY:  Past Medical History:  Diagnosis Date  . Anemia   . Atrial fibrillation ([place])   . GERD (gastroesophageal reflux disease)   . Hypertension   . Thyroid disease     SURGICAL HISTORY: Past Surgical History:  Procedure Laterality Date  . COLONOSCOPY WITH PROPOFOL N/A 04/17/2019   Procedure: COLONOSCOPY WITH PROPOFOL;  Surgeon: Lucilla Lame, MD;  [facility] ENDOSCOPY;  Service: Endoscopy;  Laterality: N/A;  . ESOPHAGOGASTRODUODENOSCOPY (EGD) WITH PROPOFOL N/A 04/17/2019   Procedure: ESOPHAGOGASTRODUODENOSCOPY (EGD) WITH PROPOFOL;  Surgeon: Lucilla Lame, MD;  Location: [facility] ENDOSCOPY;  Service: Endoscopy;  Laterality: N/A;  . GIVENS CAPSULE STUDY  05/2019   negative    SOCIAL HISTORY: Social History   Socioeconomic History  . Marital status: Married    Spouse name: Not on file  . Number of children: Not on file  . Years of education: Not on file  . Highest education level: Not on file  Occupational History  . Not on file  Social Needs  . Financial resource strain: Not on file  . Food insecurity    Worry: Not on file    Inability: Not on file  . Transportation needs    Medical: Not on file    Non-medical: Not on file  Tobacco Use  . Smoking status: Never Smoker  . Smokeless tobacco: Never Used  Substance and Sexual Activity  . Alcohol use: Yes    Alcohol/week: 70.0 standard drinks    Types: 70 Cans of beer per week    Comment: "10 beers a day"  . Drug use: Yes    Types: Marijuana    Comment: "rare-once a year"   . Sexual activity: Not on file  Lifestyle  . Physical activity    Days per week: Not on file    Minutes per session: Not on file  . Stress: Not on file  Relationships  . Social Musician on phone: Not on file    Gets together: Not on file    Attends religious service: Not on file    Active member of club or organization: Not on file    Attends meetings of clubs or organizations: Not on file    Relationship status: Not on  file  . Intimate partner violence    Fear of current or ex partner: Not on file    Emotionally abused: Not on file    Physically abused: Not on file    Forced sexual activity: Not on file  Other Topics Concern  . Not on file  Social History Narrative   Smoker; alcohol use.  Lives with his wife at home.    FAMILY HISTORY: Family History  Problem Relation Age of Onset  . Hypertension Mother   . Stroke Father   . Liver cancer Maternal Uncle     ALLERGIES:  has No Known Allergies.  MEDICATIONS:  Current Outpatient Medications  Medication Sig Dispense Refill  . FEROSUL 325 (65 Fe) MG tablet TAKE 1 TABLET BY MOUTH TWICE DAILY FOR ANEMIA    . fluticasone (FLONASE) 50 MCG/ACT nasal spray USE 2 SPRAY(S) IN EACH NOSTRIL ONCE DAILY    . levothyroxine (SYNTHROID) 75 MCG tablet TAKE 1 TABLET BY MOUTH ONCE DAILY FOR LOW THYROID    . metoprolol tartrate (LOPRESSOR) 25 MG tablet TAKE 3 TABLETS BY MOUTH TWICE DAILY FOR HIGH BLOOD PRESSURE    . ELIQUIS 5 MG TABS tablet Take 5 mg by mouth 2 (two) times daily.    Marland Kitchen omeprazole (PRILOSEC) 20 MG capsule Take 1 capsule (20 mg total) by mouth 2 (two) times daily before a meal for 14 days, THEN 1 capsule (20 mg total) daily. (Patient not taking: Reported on 06/13/2019) 90 capsule 0   No current facility-administered medications for this visit.       Marland Kitchen  PHYSICAL EXAMINATION: ECOG PERFORMANCE STATUS: 0 - Asymptomatic  Vitals:   09/04/19 1346  BP: (!) 144/88  Pulse: 85  Temp: (!) 97 F (36.1 C)   Filed Weights   09/04/19 1346  Weight: 197 lb (89.4 kg)    Physical Exam  Constitutional: He is oriented to person, place, and time and well-developed, well-nourished, and in no distress.  HENT:  Head: Normocephalic and atraumatic.  Mouth/Throat: Oropharynx is clear and moist. No oropharyngeal exudate.  Eyes: Pupils are equal, round, and reactive to light.  Neck: Normal range of motion. Neck supple.  Cardiovascular: Normal rate.  Irregular  irregular rhythm no murmurs  Pulmonary/Chest: No respiratory distress. He has no wheezes.  Abdominal: Soft. Bowel sounds are normal. He exhibits no distension and no mass. There is no abdominal tenderness. There is no rebound and no guarding.  Musculoskeletal: Normal range of motion.        General: No tenderness or edema.  Neurological: He is alert and oriented to person, place, and time.  Skin: Skin is warm.  Psychiatric: Affect normal.     LABORATORY DATA:  I have reviewed the data as listed Lab Results  Component Value Date   WBC 7.0 09/04/2019   HGB 14.6 09/04/2019   HCT 42.7 09/04/2019   MCV 94.5 09/04/2019   PLT 215 09/04/2019   Recent Labs  09/21/18 1026  04/13/19 1156 06/13/19 1337 06/15/19 1420 08/31/19 1438 09/04/19 1330  NA 134*  --  138  --   --  135 135  K 3.6  --  5.1  --   --  4.6 3.9  CL 103  --  101  --   --  99 101  CO2 23  --  17*  --   --  27 24  GLUCOSE 106*  --  77  --   --  100* 79  BUN 8  --  8  --   --  10 8  CREATININE 0.87  --  0.89  --  0.80 0.83 0.77  CALCIUM 8.0*  --  9.1  --   --  9.5 8.9  GFRNONAA >60  --  100  --   --  >60 >60  GFRAA >60  --  115  --   --  >60 >60  PROT 6.9   < > 7.0 6.7  --  7.5 7.2  ALBUMIN 3.4*   < > 4.2 4.0  --  3.9 3.8  AST 32  --  57* 89*  --  41 29  ALT 26  --  49* 82*  --  44 36  ALKPHOS 45  --  55 65  --  56 49  BILITOT 1.6*   < > 1.8* 1.4*  --  2.4* 1.4*  BILIDIR 0.3*  --   --  0.51*  --   --   --   IBILI 1.3*  --   --   --   --   --   --    < > = values in this interval not displayed.    RADIOGRAPHIC STUDIES: I have personally reviewed the radiological images as listed and agreed with the findings in the report. No results found.  ASSESSMENT & PLAN:   Iron deficiency anemia due to chronic blood loss #Iron deficient anemia-status post PO iron-hemoglobin is 14.  Continue p.o. iron/recommend every other day.  #Etiology iron deficiency anemia is unclear-EGD colonoscopy unrevealing; capsule study  negative.  CT scan shows a large hiatal hernia-question cause of his iron deficiency.  Discussed the possible risk of Barrett's/and appropriate surgical evaluation with Dr. Valaria GoodPabone.  # A.fib-on Eliquis.  Stable hemoglobin  #Erectile dysfunction-recommend evaluation with urology; defer to PCP for referrals.  # DISPOSITION: # follow up as needed- Dr.B  All questions were answered. The patient knows to call the clinic with any problems, questions or concerns.    Dean CoderGovinda R Brahmanday, MD 09/04/2019 2:38 PM

## 2019-09-12 ENCOUNTER — Ambulatory Visit: Payer: Medicaid Other | Admitting: Surgery

## 2019-09-12 ENCOUNTER — Other Ambulatory Visit: Payer: Self-pay

## 2019-09-19 ENCOUNTER — Ambulatory Visit
Admission: RE | Admit: 2019-09-19 | Discharge: 2019-09-19 | Disposition: A | Discharge status: Home / Self Care | Payer: Medicaid Other | Source: Ambulatory Visit | Attending: Surgery | Admitting: Surgery

## 2019-09-19 ENCOUNTER — Telehealth: Payer: Self-pay

## 2019-09-19 ENCOUNTER — Other Ambulatory Visit: Payer: Self-pay

## 2019-09-19 ENCOUNTER — Other Ambulatory Visit: Payer: Self-pay | Admitting: Surgery

## 2019-09-19 DIAGNOSIS — K449 Diaphragmatic hernia without obstruction or gangrene: Secondary | ICD-10-CM

## 2019-09-19 DIAGNOSIS — K219 Gastro-esophageal reflux disease without esophagitis: Secondary | ICD-10-CM | POA: Diagnosis not present

## 2019-09-19 NOTE — Telephone Encounter (Signed)
Patient notified of xray results per Dr.Pabon and reminded of appointment 09/24/19.

## 2019-09-24 ENCOUNTER — Other Ambulatory Visit: Payer: Self-pay

## 2019-09-24 ENCOUNTER — Ambulatory Visit (INDEPENDENT_AMBULATORY_CARE_PROVIDER_SITE_OTHER): Payer: Medicaid Other | Admitting: Surgery

## 2019-09-24 ENCOUNTER — Encounter: Payer: Self-pay | Admitting: Surgery

## 2019-09-24 VITALS — BP 166/97 | HR 83 | Temp 97.7°F | Resp 15 | Ht 68.0 in | Wt 203.2 lb

## 2019-09-24 DIAGNOSIS — K449 Diaphragmatic hernia without obstruction or gangrene: Secondary | ICD-10-CM

## 2019-09-24 NOTE — Patient Instructions (Signed)
We have scheduled you a follow up with Dr Nehemiah Massed at [facility] on Wednesday October 21st at 10:30 am.   Please contact you primary care provider for any medication refills.   Follow up here in 1 month with Dr Dahlia Byes.

## 2019-09-27 NOTE — Progress Notes (Signed)
Outpatient Surgical Follow Up  09/27/2019  Dean Anderson is an 53 y.o. male.   Chief Complaint  Patient presents with  . Follow-up    hiatal hernia    HPI:  53 year old with a symptomatic paraesophageal hernia with reflux symptoms.  He reports that currently his symptoms are under control when he takes PPI.  He is interested in an antireflux procedure and repair of paraesophageal hernia..  Severe esophagus.  Swallow study personally reviewed showing evidence of paraesophageal hernia with reflux.  Normal esophageal function. Does have A. fib and had apparently a positive stress test and see Dr. Nehemiah Massed later this week.  Past Medical History:  Diagnosis Date  . Anemia   . Atrial fibrillation ([place])   . GERD (gastroesophageal reflux disease)   . Hypertension   . Thyroid disease     Past Surgical History:  Procedure Laterality Date  . COLONOSCOPY WITH PROPOFOL N/A 04/17/2019   Procedure: COLONOSCOPY WITH PROPOFOL;  Surgeon: Lucilla Lame, MD;  Location: [facility] ENDOSCOPY;  Service: Endoscopy;  Laterality: N/A;  . ESOPHAGOGASTRODUODENOSCOPY (EGD) WITH PROPOFOL N/A 04/17/2019   Procedure: ESOPHAGOGASTRODUODENOSCOPY (EGD) WITH PROPOFOL;  Surgeon: Lucilla Lame, MD;  Location: [facility] ENDOSCOPY;  Service: Endoscopy;  Laterality: N/A;  . GIVENS CAPSULE STUDY  05/2019   negative    Family History  Problem Relation Age of Onset  . Hypertension Mother   . Stroke Father   . Liver cancer Maternal Uncle     Social History:  reports that he has never smoked. He has never used smokeless tobacco. He reports current alcohol use of about 70.0 standard drinks of alcohol per week. He reports current drug use. Drug: Marijuana.  Allergies: No Known Allergies  Medications reviewed.    ROS Full ROS performed and is otherwise negative other than what is stated in HPI   BP (!) 166/97   Pulse 83   Temp 97.7 F (36.5 C)   Resp 15   Ht 5\' 8"  (1.727 m)   Wt 203 lb 3.2 oz (92.2 kg)   SpO2 99%    BMI 30.90 kg/m   Physical Exam Vitals signs and nursing note reviewed.  Constitutional:      General: He is not in acute distress.    Appearance: Normal appearance. He is normal weight.  Cardiovascular:     Rate and Rhythm: Normal rate and regular rhythm.     Heart sounds: No murmur. No friction rub.  Pulmonary:     Effort: Pulmonary effort is normal. No respiratory distress.     Breath sounds: No stridor. No wheezing.  Abdominal:     General: Abdomen is flat.     Palpations: There is no mass.     Tenderness: There is no abdominal tenderness. There is no guarding.  Musculoskeletal: Normal range of motion.        General: No swelling.  Skin:    General: Skin is warm and dry.     Capillary Refill: Capillary refill takes less than 2 seconds.  Neurological:     Mental Status: He is alert.  Psychiatric:        Mood and Affect: Mood normal.        Behavior: Behavior normal.        Thought Content: Thought content normal.        Judgment: Judgment normal.      Assessment/Plan: Symptomatic hiatal hernia with reflux.  I do think that he will benefit from a robotic fundoplication with hernia repair however he does  need to be evaluated by cardiology given the positive stress test. Hopefully once he is optimized by cardiology we can schedule his robotic paraesophageal repair with Nissen fundoplication.  Please note that I spent at least 25 minutes in this encounter with greater than 50% spent in coordination and counseling of his care  Sterling Big, MD [facility] General Surgeon

## 2019-10-03 DIAGNOSIS — I480 Paroxysmal atrial fibrillation: Secondary | ICD-10-CM | POA: Diagnosis not present

## 2019-10-03 DIAGNOSIS — I4819 Other persistent atrial fibrillation: Secondary | ICD-10-CM | POA: Diagnosis not present

## 2019-10-03 DIAGNOSIS — I10 Essential (primary) hypertension: Secondary | ICD-10-CM | POA: Diagnosis not present

## 2019-10-12 DIAGNOSIS — R220 Localized swelling, mass and lump, head: Secondary | ICD-10-CM | POA: Diagnosis not present

## 2019-10-12 DIAGNOSIS — K449 Diaphragmatic hernia without obstruction or gangrene: Secondary | ICD-10-CM | POA: Diagnosis not present

## 2019-10-12 DIAGNOSIS — I10 Essential (primary) hypertension: Secondary | ICD-10-CM | POA: Diagnosis not present

## 2019-10-12 DIAGNOSIS — K2270 Barrett's esophagus without dysplasia: Secondary | ICD-10-CM | POA: Diagnosis not present

## 2019-10-22 ENCOUNTER — Ambulatory Visit: Payer: Medicaid Other | Admitting: Surgery

## 2019-11-12 ENCOUNTER — Ambulatory Visit: Payer: Medicaid Other | Admitting: Surgery

## 2019-11-16 DIAGNOSIS — Z125 Encounter for screening for malignant neoplasm of prostate: Secondary | ICD-10-CM | POA: Diagnosis not present

## 2019-11-16 DIAGNOSIS — E039 Hypothyroidism, unspecified: Secondary | ICD-10-CM | POA: Diagnosis not present

## 2019-11-19 ENCOUNTER — Ambulatory Visit (INDEPENDENT_AMBULATORY_CARE_PROVIDER_SITE_OTHER): Payer: Medicaid Other | Admitting: Surgery

## 2019-11-19 ENCOUNTER — Other Ambulatory Visit: Payer: Self-pay

## 2019-11-19 ENCOUNTER — Encounter: Payer: Self-pay | Admitting: Surgery

## 2019-11-19 VITALS — BP 168/114 | HR 82 | Temp 97.3°F | Resp 12 | Ht 68.0 in | Wt 211.4 lb

## 2019-11-19 DIAGNOSIS — K449 Diaphragmatic hernia without obstruction or gangrene: Secondary | ICD-10-CM | POA: Diagnosis not present

## 2019-11-19 NOTE — Patient Instructions (Addendum)
Our Surgery Scheduler will call you within 24-48 hours to schedule your surgery.  Please have the [facility] Sheet available when she calls you.     Toupet Fundoplication  Toupet fundoplication is a procedure to treat serious symptoms of a type of severe and long-standing heartburn (gastroesophageal reflux disease, GERD). In GERD, food and acid in the stomach flow back up into the esophagus. The esophagus is the part of the body that carries food from the mouth to the stomach. Normally, the muscle between the stomach and the esophagus (lower esophageal sphincter, LES) keeps stomach contents in the stomach. In some people, the LES does not work properly, so stomach fluids flow up into the esophagus. This can happen when part of the stomach bulges up through the LES (hiatal hernia). In this procedure, the upper part of the stomach (fundus) is wrapped partially around the lower part of the esophagus and then stitched in place. This is done to strengthen the LES and help prevent reflux. You may need this surgery if other treatments for GERD have not helped or if you have a hiatal hernia that needs to be repaired. This procedure will strengthen the LES and prevent reflux. Tell a health care provider about:  Any allergies you have.  All medicines you are taking, including vitamins, herbs, eye drops, creams, and over-the-counter medicines.  Any problems you or family members have had with anesthetic medicines.  Any blood disorders you have.  Any surgeries you have had.  Any medical conditions you have.  Whether you are pregnant or may be pregnant. What are the risks? Generally, this is a safe procedure. However, problems may occur, including:  Trouble swallowing (dysphagia).  Bleeding.  Infection.  Damage to the digestive system (rare).  Breathing problems (rare). What happens before the procedure? Medicines Ask your health care provider about:  Changing or stopping your regular  medicines. This is especially important if you are taking diabetes medicines or blood thinners.  Taking medicines such as aspirin and ibuprofen. These medicines can thin your blood. Do not take these medicines unless your health care provider tells you to take them.  Taking over-the-counter medicines, vitamins, herbs, and supplements. You may need to stop taking some of these before your procedure, especially vitamin E. Staying hydrated Follow instructions from your health care provider about hydration, which may include:  Up to 2 hours before the procedure - you may continue to drink clear liquids, such as water, clear fruit juice, black coffee, and plain tea. Eating and drinking restrictions Follow instructions from your health care provider about eating and drinking, which may include:  8 hours before the procedure - stop eating heavy meals or foods such as meat, fried foods, or fatty foods.  6 hours before the procedure - stop eating light meals or foods, such as toast or cereal.  6 hours before the procedure - stop drinking milk or drinks that contain milk.  2 hours before the procedure - stop drinking clear liquids. General instructions  You may need to have tests before this procedure. These may include: ? Blood tests. ? Tests of your esophagus to check the pressure (manometry) and to measure how much acid is coming up from your stomach (pH monitoring).  Plan to have someone take you home from the hospital or clinic.  Plan to have a responsible adult care for you for at least 24 hours after you leave the hospital or clinic. This is important. What happens during the procedure?  To reduce  your risk of infection: ? Your health care team will wash or sanitize their hands. ? Hair may be removed from the surgical area. ? Your skin will be washed with soap.  An IV will be inserted into one of your veins. You will be given one or more of the following: ? A medicine to help you  relax (sedative). ? A medicine to make you fall asleep (general anesthetic).  Your health care provider will perform the following steps, depending on the type of fundoplication that you will be having: ? Open procedure.  One long surgical cut (incision) will be made in your belly (abdomen). This allows the surgeon to work inside Estate manager/land agent.  The surgeon will wrap the upper portion of your stomach partially around the lower portion of your esophagus and stitch them together. ? Laparoscopic procedure. This is more commonly used than an open procedure.  Several small incisions will be made in your abdomen.  A thin tube with a camera (laparoscope) will be placed through one of the incisions. Surgical instruments will be placed through the other incisions.  The surgeon will wrap the upper portion of your stomach partially around the lower portion of your esophagus and stitch them together.  If you have a hiatal hernia, it will be repaired.  Your incision(s) will be closed with stitches (sutures) and covered with a bandage (dressing). The procedure may vary among health care providers and hospitals. What happens after the procedure?  Your blood pressure, heart rate, breathing rate, and blood oxygen level will be monitored until the medicines you were given have worn off.  You will be given pain medicine as needed.  You will slowly start to drink and eat. You may need to start gradually by beginning with liquids and soft foods that are easy to digest. Summary  Toupet fundoplication is a procedure to treat serious symptoms of gastroesophageal reflux disease (GERD).  Before the procedure, follow all eating and drinking restrictions as told.  Plan to have a responsible adult care for you for at least 24 hours after you leave the hospital or clinic. This is important.  Your procedure will be done through one long incision (open procedure) or through several small incisions (laparoscopic  procedure).  Your incision(s) will be closed with stitches (sutures) and covered with a bandage (dressing). This information is not intended to replace advice given to you by your health care provider. Make sure you discuss any questions you have with your health care provider. Document Released: 09/06/2014 Document Revised: 11/04/2017 Document Reviewed: 09/28/2017 Elsevier Patient Education  2020 [employer].

## 2019-11-20 ENCOUNTER — Encounter: Payer: Self-pay | Admitting: Surgery

## 2019-11-20 NOTE — Progress Notes (Signed)
Outpatient Surgical Follow Up  11/20/2019  Dean Anderson is an 53 y.o. male.   Chief Complaint  Patient presents with  . Follow-up    Hiatal Hernia     HPI: 53 year old with a symptomatic paraesophageal hernia with reflux symptoms.  He reports that currently his symptoms are under control when he takes PPI.  He is interested in an antireflux procedure and repair of paraesophageal hernia.Ricka Burdock study personally reviewed showing evidence of paraesophageal hernia with reflux.  Normal esophageal function. Does have A. fib and had apparently was see by  Dr. Barton Fanny placed on a b blocker, no further w/u needed. Here for oerative discussion  Past Medical History:  Diagnosis Date  . Anemia   . Atrial fibrillation ([place])   . GERD (gastroesophageal reflux disease)   . Hypertension   . Thyroid disease     Past Surgical History:  Procedure Laterality Date  . COLONOSCOPY WITH PROPOFOL N/A 04/17/2019   Procedure: COLONOSCOPY WITH PROPOFOL;  Surgeon: Lucilla Lame, MD;  Location[facility] ENDOSCOPY;  Service: Endoscopy;  Laterality: N/A;  . ESOPHAGOGASTRODUODENOSCOPY (EGD) WITH PROPOFOL N/A 04/17/2019   Procedure: ESOPHAGOGASTRODUODENOSCOPY (EGD) WITH PROPOFOL;  Surgeon: Lucilla Lame, MD;  Location: [facility] ENDOSCOPY;  Service: Endoscopy;  Laterality: N/A;  . GIVENS CAPSULE STUDY  05/2019   negative    Family History  Problem Relation Age of Onset  . Hypertension Mother   . Stroke Father   . Liver cancer Maternal Uncle     Social History:  reports that he has never smoked. He has never used smokeless tobacco. He reports current alcohol use of about 70.0 standard drinks of alcohol per week. He reports previous drug use. Drug: Marijuana.  Allergies: No Known Allergies  Medications reviewed.    ROS Full ROS performed and is otherwise negative other than what is stated in HPI   BP (!) 168/114   Pulse 82   Temp (!) 97.3 F (36.3 C) (Temporal)   Resp 12   Ht 5\' 8"  (1.727 m)   Wt  211 lb 6.4 oz (95.9 kg)   SpO2 97%   BMI 32.14 kg/m   Physical Exam Vitals and nursing note reviewed. Exam conducted with a chaperone present.  Constitutional:      General: He is not in acute distress.    Appearance: Normal appearance. He is normal weight.  Eyes:     General: No scleral icterus.       Right eye: No discharge.        Left eye: No discharge.  Cardiovascular:     Rate and Rhythm: Normal rate. Rhythm irregular.     Pulses: Normal pulses.     Heart sounds: No murmur.  Pulmonary:     Effort: Pulmonary effort is normal. No respiratory distress.     Breath sounds: No stridor.  Abdominal:     General: Abdomen is flat. There is no distension.     Palpations: Abdomen is soft. There is no mass.     Tenderness: There is no abdominal tenderness. There is no rebound.     Hernia: No hernia is present.  Musculoskeletal:     Cervical back: Normal range of motion.  Skin:    General: Skin is warm and dry.     Capillary Refill: Capillary refill takes less than 2 seconds.     Coloration: Skin is not jaundiced.  Neurological:     General: No focal deficit present.     Mental Status: He is  alert and oriented to person, place, and time.  Psychiatric:        Mood and Affect: Mood normal.        Behavior: Behavior normal.        Thought Content: Thought content normal.        Judgment: Judgment normal.       Assessment/Plan: Symptomatic Paraesophageal hernia.  Discussed with the patient in detail about surgical intervention.  Risk benefit and possible complications including but not limited to: Bleeding, infection pneumothorax esophageal injuries recurrence of symptoms and prolonged hospitalization.  Do think that he will benefit and there is a good chance to have good outcomes.  Also discussed with him at length about strict diet modification after paraesophageal hernia repair.  He understands and wishes to proceed.  We will schedule for next few weeks  pending or  availability.   Greater than 50% of the 40 minutes  visit was spent in counseling/coordination of care   Sterling Big, MD [facility] General Surgeon

## 2019-11-28 ENCOUNTER — Telehealth: Payer: Self-pay | Admitting: Surgery

## 2019-11-28 NOTE — Telephone Encounter (Signed)
I have called patient to discuss surgery information below. No answer. I have left a message for the patient to call the office.   Surgery Date: 01/01/20 with Dr Ricarda Frame assisted paraesophageal hernia repair.  Preadmission Testing Date: 12/27/19 between 8-1:00pm-Phone interview.  Covid Testing Date: 12/28/19 between 8-10:30am - patient advised to go to the [facility] ([place])  [employer] Video sent via [employer] [facility] and [employer].  Please make patient aware to call [phone], between 1-3:00pm the day before surgery, to find out what time to arrive.

## 2019-12-14 DIAGNOSIS — R0602 Shortness of breath: Secondary | ICD-10-CM | POA: Diagnosis not present

## 2019-12-17 DIAGNOSIS — Z20828 Contact with and (suspected) exposure to other viral communicable diseases: Secondary | ICD-10-CM | POA: Diagnosis not present

## 2019-12-27 ENCOUNTER — Ambulatory Visit
Admission: RE | Admit: 2019-12-27 | Discharge: 2019-12-27 | Disposition: A | Discharge status: Home / Self Care | Payer: Medicaid Other | Attending: Family Medicine | Admitting: Family Medicine

## 2019-12-27 ENCOUNTER — Ambulatory Visit
Admission: RE | Admit: 2019-12-27 | Discharge: 2019-12-27 | Disposition: A | Discharge status: Home / Self Care | Payer: Medicaid Other | Source: Ambulatory Visit | Attending: Family Medicine | Admitting: Family Medicine

## 2019-12-27 ENCOUNTER — Ambulatory Visit: Payer: Medicaid Other | Admitting: Family Medicine

## 2019-12-27 ENCOUNTER — Telehealth: Payer: Self-pay

## 2019-12-27 ENCOUNTER — Other Ambulatory Visit
Admission: RE | Admit: 2019-12-27 | Discharge: 2019-12-27 | Disposition: A | Discharge status: Home / Self Care | Payer: Medicaid Other | Source: Ambulatory Visit | Attending: Surgery | Admitting: Surgery

## 2019-12-27 ENCOUNTER — Other Ambulatory Visit: Payer: Self-pay

## 2019-12-27 ENCOUNTER — Encounter: Payer: Self-pay | Admitting: Family Medicine

## 2019-12-27 ENCOUNTER — Telehealth: Payer: Self-pay | Admitting: *Deleted

## 2019-12-27 VITALS — BP 154/102 | HR 87 | Temp 97.1°F | Resp 14 | Ht 68.0 in | Wt 212.7 lb

## 2019-12-27 DIAGNOSIS — K2900 Acute gastritis without bleeding: Secondary | ICD-10-CM | POA: Diagnosis not present

## 2019-12-27 DIAGNOSIS — R062 Wheezing: Secondary | ICD-10-CM

## 2019-12-27 DIAGNOSIS — F1010 Alcohol abuse, uncomplicated: Secondary | ICD-10-CM

## 2019-12-27 DIAGNOSIS — I4819 Other persistent atrial fibrillation: Secondary | ICD-10-CM | POA: Diagnosis not present

## 2019-12-27 DIAGNOSIS — K449 Diaphragmatic hernia without obstruction or gangrene: Secondary | ICD-10-CM

## 2019-12-27 DIAGNOSIS — R0601 Orthopnea: Secondary | ICD-10-CM | POA: Insufficient documentation

## 2019-12-27 DIAGNOSIS — R059 Cough, unspecified: Secondary | ICD-10-CM

## 2019-12-27 DIAGNOSIS — R05 Cough: Secondary | ICD-10-CM

## 2019-12-27 DIAGNOSIS — I10 Essential (primary) hypertension: Secondary | ICD-10-CM

## 2019-12-27 DIAGNOSIS — K2270 Barrett's esophagus without dysplasia: Secondary | ICD-10-CM

## 2019-12-27 DIAGNOSIS — K909 Intestinal malabsorption, unspecified: Secondary | ICD-10-CM

## 2019-12-27 DIAGNOSIS — R0602 Shortness of breath: Secondary | ICD-10-CM | POA: Insufficient documentation

## 2019-12-27 DIAGNOSIS — J90 Pleural effusion, not elsewhere classified: Secondary | ICD-10-CM

## 2019-12-27 DIAGNOSIS — R0600 Dyspnea, unspecified: Secondary | ICD-10-CM

## 2019-12-27 DIAGNOSIS — E785 Hyperlipidemia, unspecified: Secondary | ICD-10-CM | POA: Diagnosis not present

## 2019-12-27 DIAGNOSIS — E039 Hypothyroidism, unspecified: Secondary | ICD-10-CM

## 2019-12-27 DIAGNOSIS — D509 Iron deficiency anemia, unspecified: Secondary | ICD-10-CM

## 2019-12-27 DIAGNOSIS — R7989 Other specified abnormal findings of blood chemistry: Secondary | ICD-10-CM

## 2019-12-27 DIAGNOSIS — K760 Fatty (change of) liver, not elsewhere classified: Secondary | ICD-10-CM

## 2019-12-27 DIAGNOSIS — R0609 Other forms of dyspnea: Secondary | ICD-10-CM

## 2019-12-27 MED ORDER — LISINOPRIL-HYDROCHLOROTHIAZIDE 20-12.5 MG PO TABS: 1.0000 | ORAL_TABLET | Freq: Every day | ORAL | 3 refills | Status: DC

## 2019-12-27 MED ORDER — BUDESONIDE-FORMOTEROL FUMARATE 160-4.5 MCG/ACT IN AERO: 2.0000 | INHALATION_SPRAY | Freq: Two times a day (BID) | RESPIRATORY_TRACT | 3 refills | Status: DC

## 2019-12-27 MED ORDER — OMEPRAZOLE 20 MG PO CPDR: 20.0000 mg | DELAYED_RELEASE_CAPSULE | Freq: Every day | ORAL | 1 refills | Status: DC

## 2019-12-27 MED ORDER — LISINOPRIL 20 MG PO TABS: 20.0000 mg | ORAL_TABLET | Freq: Every day | ORAL | 3 refills | Status: DC

## 2019-12-27 NOTE — Telephone Encounter (Signed)
Tiffany from Outpatient [facility] called with Chest XRAY report:   IMPRESSION: Sizable right pleural effusion with consolidation throughout most of the right middle and lower lobes. Left lung clear. Cardiac silhouette normal. No adenopathy. There is a focal paraesophageal Hernia. [facility] notified and spoke with Danelle Berry, PA.

## 2019-12-27 NOTE — Progress Notes (Signed)
Name: Dean Anderson   MRN: [id number]    DOB: 12-Aug-1966   Date:12/27/2019       Progress Note  Chief Complaint  Patient presents with  . New Patient (Initial Visit)  . Shortness of Breath    with exercertion, pt states all started when dx with a-fib.  Does follow cardio  . Hypothyroidism    needs refill  . Gastroesophageal Reflux    needs refill     Subjective:   Dean Anderson is a 54 y.o. male, presents to clinic for routine follow up on the conditions listed above.  Pt has hx of persistent afib rate controlled on metoprolol 75 mg BID, some possible SE over the past 1-2 months - exertional sx SOB, he feels tired - he says he has called and discussed with cardiology, was previously on lower dose 25 mg BID.  Sometimes when laying down in the bed, feels SOB like his chest is "constricted", usually sx occur first when laying down and then ease up in a few minutes.  He denies sensation of drowning and denies propping up on pillow, if he waits a little while after laying down it will go away.  He denies LE edema, weight gain, PND, near syncope.  He does feel his aFib as palpitations and he has worked with cardiology on adjusting metoprolol dosing to manage them, feels best with his current dose 75 mg BID.  He does endorse current wheeze and cough for the past 3 months or so.  No wheeze at night associated with orthopnea, he states its during the day when he's working he will here a little short high pitched wheeze or squeek.  If he takes a deep breath he has not wheeze.  Cough is cry, no CP, no recent URI or chest cold.  He denies hx of asthma, COPD, or any other lung diease.  Non- smoker.  Grew up around smokers and wife smokes now - but out of the house.  He further denies any fever, chills, sweats, body aches, weight changes.  No exertional CP.  We discussed his BP and fast aFib that can cause symptoms - afib with RVR and he denies anything like this (after visit was able to review chart and he  has experienced this at least a few times in the past 2 years). Pt states he "felt better on steroids" when asking him about that he explains that last week at prior PCP office (transfering care here today) he saw the PA about a week ago, treated with steroids and that made him feel better and he started to feel a little worse as the dose decreased and stopped. He has an inhaler as well - albuterol.  He feels it help his wheeze and SOB sometimes thorughout the day, but he hasn't tried it at night.  Relief is "short" with albuterol inhaler.  He denies any hx of annual or recurrent bronchitis.  Persistant Afib - sees cardiology Dr. Gwen Pounds at [place] [facility], last OV 10/03/2019 reviewed in care everywhere today - last visit noted asx persistent afib managed with metoprolol, with "etiology of this atrial fibrillation includes sick sinus syndrome." Weight at 10/28 appt was 204, today 212.  BP was 140/98 managed with lifestyle, no meds Pt not anticoagulated currently, was previously on eliquis (09/2018-04/2019) was stopped due to BRBPR, anemia, hgb ~8 and pt sent to GI and hematology.  Reviewed back through many cardiology visits with Gavin Potters, initial consult was 10/02/2018 after ER visit for new  onset aFib with RVR, pt noted with cardiology visit to state "he has been short of breath "for years."" Reviewed past cardiac testing through care everywhere:  Olena Heckle, MD - 10/30/2018 4:45 PM EST Holter monitor  54 year old with atrial fibrillation  Baseline normal sinus rhythm with maximum heart rate of 131 bpm minimum of 45 beats per an average of 77 bpm. There is occasional preventricular and pre-atrial contraction. Heart rate was variable depending on activity levels with some bradycardia likely secondary to medication management and no evidence of atrial fibrillation.  No ECHO done that I can see with thorough chart review - note of elevated BNP at some time From 09/2018 to 09/2019, pt had BB  metorprolol gradual dose increase from 25 BID to now 75 BID, briefly was on eliquis and amiodarone.  Dr. Gwen Pounds mentioned getting PFTs, but unfortunately, PFTs not done due to COVID pandemic.  Unclear if echocardiogram was done I cannot see all of the procedures done in office through care everywhere, and there is note of nonvalvular A. Fib.  HTN - unstable, poorly controlled, pt states hes never been on other BP/HTN meds, only on metoprolol, BP he states is usually 160/100. Pt endorses excellent compliance with metoprolol, he denies any lightheadedness, near syncope Blood pressure today is uncontrolled. BP Readings from Last 3 Encounters:  12/27/19 (!) 154/102  11/19/19 (!) 168/114  09/24/19 (!) 166/97   Dietary efforts for BP?  He has been instructed by Dr. Gwen Pounds several times to work on lifestyle dietary efforts and decrease EtOH  Patient also states he is out of his Prilosec he would like a refill today.  He is unclear about his Prilosec or Protonix dosing I can see several in the chart history.  He believes he is taking Prilosec once a day in the morning he does take it with all of his other medications.  He has seen GI and had extensive work-up done secondary to suspected GI bleed, bright red blood per rectum, he has had colonoscopy, EGD and capsule study done, after more thorough chart review does have diagnosis of Barrett's esophagus, had treated H pylori, and gastritis.  Also encouraged multiple times to decrease EtOH patient states he is currently not having any indigestion, bloating, acid reflux, dysphagia.  Since has been out of his medication he has intermittent slight twinge of pain.  He also reports history of hiatal hernia and he was referred to surgeons for a repair but he is very scared of the surgery  He also request a refill on his levothyroxine he states he is on the same dose that is in the chart which is 75 mcg he is taking that in the morning with all of his other pills  -including Prilosec.  He denies running out of levothyroxine  Dr. Marvis Moeller and PA at [facility] clinic - pt only saw MD once -per his report and saw the PA several times No PCP before that   Pt states he's had no health problems his whole life up until he turned 52 two years ago  Sig ETOH - at least 6 beers a day, but he has been cutting back he states, in reviewing chart history he was drinking more about 8-10 beers per day.  History of elevated LFTs May to July that improved he did have a right upper quadrant ultrasound done by GI which showed hepatic steatosis  He reports that his cholesterol is probably "sky high" but he denies ever being on cholesterol medication or  having a cholesterol panel or labs done  Patient states his father had a stroke and he does not know any other medical history he was not in his life very much His mother has history of hypertension and thyroid disease, he has a brother with heart disease and hypertension and he does not know any other more specific details about what kind of heart disease he has.  Patient Active Problem List   Diagnosis Date Noted  . 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 Iron deficiency anemia due to chronic blood loss   . Other specified diseases of esophagus   . Acute gastritis without hemorrhage   . Hematochezia   . Palpitations 01/17/2019  . Hypertension, essential 10/02/2018  . Shortness of breath 10/02/2018  . Persistent atrial fibrillation (HCC) 09/21/2018    Past Surgical History:  Procedure Laterality Date  . COLONOSCOPY WITH PROPOFOL N/A 04/17/2019   Procedure: COLONOSCOPY WITH PROPOFOL;  Surgeon: Midge MiniumWohl, Darren, MD;  [facility];  Service: Endoscopy;  Laterality: N/A;  . ESOPHAGOGASTRODUODENOSCOPY (EGD) WITH PROPOFOL N/A 04/17/2019   Procedure: ESOPHAGOGASTRODUODENOSCOPY (EGD) WITH PROPOFOL;  Surgeon: Midge MiniumWohl, Darren, MD;  Location: [facility] ENDOSCOPY;  Service: Endoscopy;  Laterality: N/A;  . GIVENS CAPSULE STUDY  05/2019    negative    Family History  Problem Relation Age of Onset  . Hypertension Mother   . Thyroid disease Mother   . Stroke Father [id number]  . Liver cancer Maternal Uncle   . Heart disease Brother   . Hypertension Brother     Social History   Socioeconomic History  . Marital status: Married    Spouse name: [place]  . Number of children: Not on file  . Years of education: 7512  . Highest education level: Some college, no degree  Occupational History  . Not on file  Tobacco Use  . Smoking status: Never Smoker  . Smokeless tobacco: Never Used  Substance and Sexual Activity  . Alcohol use: Yes    Alcohol/week: 70.0 standard drinks    Types: 70 Cans of beer per week    Comment: 6 beers a day  . Drug use: Not Currently    Comment: "rare-once a year"   . Sexual activity: Yes  Other Topics Concern  . Not on file  Social History Narrative   alcohol use.  Lives with his wife at home.   No lung dx hx, grew up with second hand smoke, wife smokes out of home   Social Determinants of Health   Financial Resource Strain:   . Difficulty of Paying Living Expenses: Not on file  Food Insecurity:   . Worried About Programme researcher, broadcasting/film/videounning Out of Food in the Last Year: Not on file  . Ran Out of Food in the Last Year: Not on file  Transportation Needs:   . Lack of Transportation (Medical): Not on file  . Lack of Transportation (Non-Medical): Not on file  Physical Activity:   . Days of Exercise per Week: Not on file  . Minutes of Exercise per Session: Not on file  Stress:   . Feeling of Stress : Not on file  Social Connections:   . Frequency of Communication with Friends and Family: Not on file  . Frequency of Social Gatherings with Friends and Family: Not on file  . Attends Religious Services: Not on file  . Active Member of Clubs or Organizations: Not on file  . Attends BankerClub or Organization Meetings: Not on file  . Marital Status: Not on file  Intimate Partner  Violence:   . Fear of Current or  Ex-Partner: Not on file  . Emotionally Abused: Not on file  . Physically Abused: Not on file  . Sexually Abused: Not on file     Current Outpatient Medications:  .  albuterol (VENTOLIN HFA) 108 (90 Base) MCG/ACT inhaler, Inhale 2 puffs into the lungs every 6 (six) hours as needed for wheezing or shortness of breath., Disp: , Rfl:  .  FEROSUL 325 (65 Fe) MG tablet, Take 325 mg by mouth as needed. , Disp: , Rfl:  .  fluticasone (FLONASE) 50 MCG/ACT nasal spray, Place 2 sprays into both nostrils as needed. , Disp: , Rfl:  .  metoprolol tartrate (LOPRESSOR) 25 MG tablet, TAKE 3 TABLETS BY MOUTH TWICE DAILY FOR HIGH BLOOD PRESSURE, Disp: , Rfl:  .  levothyroxine (SYNTHROID) 75 MCG tablet, TAKE 1 TABLET BY MOUTH ONCE DAILY FOR LOW THYROID, Disp: , Rfl:  .  omeprazole (PRILOSEC) 20 MG capsule, Take 20 mg by mouth daily., Disp: , Rfl:   No Known Allergies  Chart Review Today: Extensive chart review as documented I personally reviewed active problem list, medication list, allergies, family history, social history, health maintenance, lab results, imaging with the patient/caregiver today.   Review of Systems  Constitutional: Negative.  Negative for activity change, appetite change, chills, diaphoresis, fatigue and fever.  HENT: Negative.   Eyes: Negative.   Respiratory: Positive for cough, chest tightness, shortness of breath and wheezing. Negative for apnea, choking and stridor.   Cardiovascular: Positive for palpitations. Negative for chest pain and leg swelling.  Gastrointestinal: Negative.  Negative for abdominal pain, anal bleeding, blood in stool, constipation, diarrhea, nausea and vomiting.  Endocrine: Negative.   Genitourinary: Negative.   Musculoskeletal: Negative.   Skin: Negative.  Negative for color change, pallor and rash.  Allergic/Immunologic: Negative.   Neurological: Negative.  Negative for dizziness and syncope.  Hematological: Negative.   Psychiatric/Behavioral: Negative.    All other systems reviewed and are negative.    Objective:    Vitals:   12/27/19 1113  BP: (!) 154/102  Pulse: 87  Resp: 14  Temp: (!) 97.1 F (36.2 C)  SpO2: 97%  Weight: 212 lb 11.2 oz (96.5 kg)  Height: 5\' 8"  (1.727 m)    Body mass index is 32.34 kg/m.  Physical Exam Vitals and nursing note reviewed.  Constitutional:      General: He is not in acute distress.    Appearance: Normal appearance. He is well-developed. He is obese. He is not ill-appearing, toxic-appearing or diaphoretic.     Interventions: Face mask in place.     Comments: NAD, well appearing obese male, dirty - appears to have come from work with dirty clothes and mild odor  HENT:     Head: Normocephalic and atraumatic.     Jaw: No trismus.     Right Ear: External ear normal.     Left Ear: External ear normal.     Nose: Nose normal.     Mouth/Throat:     Mouth: Mucous membranes are moist.     Pharynx: Oropharynx is clear.  Eyes:     General: Lids are normal. No scleral icterus.    Conjunctiva/sclera: Conjunctivae normal.     Pupils: Pupils are equal, round, and reactive to light.  Neck:     Trachea: Trachea and phonation normal. No tracheal deviation.  Cardiovascular:     Rate and Rhythm: Rhythm irregular.     Chest Wall: No thrill.  Pulses: Normal pulses.          Radial pulses are 2+ on the right side and 2+ on the left side.       Posterior tibial pulses are 2+ on the right side and 2+ on the left side.     Heart sounds: Normal heart sounds. Heart sounds not distant. No murmur. No friction rub. No gallop.      Comments: Mild b/l non-pitting edema with scattered varicosities and reticular veins to LE Pulmonary:     Effort: Pulmonary effort is normal. No respiratory distress.     Breath sounds: Normal breath sounds. No stridor. No wheezing, rhonchi or rales.     Comments: CTA A&P to mid to upper lung fields Ambulatory pulse ox today prior to leaving clinic 96-98% Abdominal:     General:  Bowel sounds are normal. There is no distension.     Palpations: Abdomen is soft.     Tenderness: There is no abdominal tenderness. There is no guarding or rebound.  Musculoskeletal:        General: Normal range of motion.     Cervical back: Normal range of motion and neck supple.     Right lower leg: Edema present.     Left lower leg: Edema present.  Skin:    General: Skin is warm and dry.     Capillary Refill: Capillary refill takes less than 2 seconds.     Coloration: Skin is not jaundiced.     Findings: No rash.     Nails: There is no clubbing.  Neurological:     Mental Status: He is alert.     Cranial Nerves: No dysarthria or facial asymmetry.     Motor: No weakness, tremor or abnormal muscle tone.     Coordination: Coordination normal.     Gait: Gait normal.  Psychiatric:        Mood and Affect: Mood normal.        Speech: Speech normal.        Behavior: Behavior normal. Behavior is cooperative.       Fall Risk: Fall Risk  12/27/2019 11/19/2019  Falls in the past year? 1 0  Number falls in past yr: 1 -  Injury with Fall? 1 -    Functional Status Survey: Is the patient deaf or have difficulty hearing?: No Does the patient have difficulty seeing, even when wearing glasses/contacts?: No Does the patient have difficulty concentrating, remembering, or making decisions?: No Does the patient have difficulty walking or climbing stairs?: No Does the patient have difficulty dressing or bathing?: No Does the patient have difficulty doing errands alone such as visiting a doctor's office or shopping?: No   Assessment & Plan:   New pt to est care today, was being seen at [facility] clinic - Dr Marvis Moeller, but wife encouraged him to seek a different PCP: His initial concerns were getting refills on his medication of Prilosec and levothyroxine He stated he is managed by cardiology for his metoprolol And although he has elevated and uncontrolled hypertension he did not have any other  blood pressure medications that he is taken recently or in the past.  Discussed some of his concerns especially about some orthopnea, dry cough and exertional shortness of breath which he thought was related to his metoprolol for management of A. fib, and patient's basic lab work trying to recheck all of his known conditions in this EMR, were done and he was sent for chest x-ray, plan at  the time of him leaving clinic today was to try inhalers possibly a maintenance inhaler because he noted improvement when using albuterol and for the days that he was taking a steroid last week.  - CXR with critical results today received at 1:21 pm and reviewed - notified pt, called radiology to consult  Significant for  Right Pleural effusion:  CLINICAL DATA:  Shortness of breath with cough and pain  EXAM: CHEST - 2 VIEW  COMPARISON:  September 21, 2018  FINDINGS: There is a sizable right pleural effusion with consolidation throughout most of the right middle and lower lobes. Left lung is clear. Heart size and pulmonary vascularity are normal. No adenopathy evident. There is a focal paraesophageal hernia. No bone lesions. No pneumothorax.  IMPRESSION: Sizable right pleural effusion with consolidation throughout most of the right middle and lower lobes. Left lung clear. Cardiac silhouette normal. No adenopathy. There is a focal paraesophageal hernia.  These results will be called to the ordering clinician or representative by the Radiologist Assistant, and communication documented in the PACS or zVision Dashboard.   Electronically Signed   By: Bretta Bang III M.D.   On: 12/27/2019 13:04  Did contact the patient regarding the findings, call radiology and interventional radiology will discuss findings and discuss thoracentesis  Ordering US guided thoracentesis after consulting IR today, Dr. Rica Records Labs ordered on pleural fluid- ?Cell count and cell  differential ?pH ?Protein ?Lactate dehydrogenase (LDH) ?Glucose Culture, gram stain and cytology - etiology unclear - most suspicious for transudate from possible CHF?   Right pleural effusion  - etiology?  CHF? Back to cardiology for ECHO, cirrhosis? Trend LFTs pt reports decreasing ETOH but still drinking 6 beers/d Unfortunately unable to add BNP onto labs today New right pleural effusion, did see notes of elevated BNP in the past when patient saw cardiology but I cannot see any echo report there is no noted history of heart failure.  BNP from what I can find in the lab was only in the 200s, possible that pleural effusion may be secondary to heart failure?  Does have A. fib has had multiple episodes of A. fib with RVR and has uncontrolled unmedicated hypertension.  He is complained of shortness of breath for a long time today he states 3 months but in reviewing office visit for the past several years do see that he has had over 2 years of complaints of shortness of breath which has been going on for "a long time".  PFTs were never completed unfortunately due to Covid pandemic when cardiology ordered them about a year ago -still may have some pulmonary disease will still proceed with a combination inhaler, albuterol as needed and follow-up, possibly pulmonology referral  Elevated LFTs noted May 2020 through July 2020 and then returned to normal with his most recent labs in September 2020 -he had been referred to GI for GI bleed and anemia, found to have a hiatal hernia He had a colonoscopy done May 2020 for anemia and rectal bleeding, colonoscopy pertinent for gastritis with H. Pylori, he was treated with triple therapy and did a follow-up capsule study - Hgb has been stable with last labs done with hematology and GI, he is on iron supplement, no signs or sx of GI bleed, will recheck CBC today      ICD-10-CM   1. Shortness of breath  R06.02 CBC with Differential/Platelet    COMPLETE METABOLIC  PANEL WITH GFR    Lipid panel    TSH  DG Chest 2 View    US THORACENTESIS ASP PLEURAL SPACE W/IMG GUIDE   see above - pulm vs cardiac or both  2. Acute gastritis without hemorrhage, unspecified gastritis type  K29.00 omeprazole (PRILOSEC) 20 MG capsule   refill of Prilosec he does see GI  3. Hiatal hernia  K44.9 omeprazole (PRILOSEC) 20 MG capsule  4. Iron deficiency anemia, unspecified iron deficiency anemia type  D50.9 CBC with Differential/Platelet   On iron supplement will trend CBC but will not repeat ferritin today due to specialist involved in management  5. Iron malabsorption  K90.9 CBC with Differential/Platelet  6. Persistent atrial fibrillation ([place])  I48.19    Per cardiology  7. Hypertension, unspecified type  Z85 COMPLETE METABOLIC PANEL WITH GFR    lisinopril (ZESTRIL) 20 MG tablet    DISCONTINUED: lisinopril-hydrochlorothiazide (ZESTORETIC) 20-12.5 MG tablet   unstable, poorly controlled, start lisinopril and con't metoprolol -May need Lasix or diuretic close follow-up next week  8. Orthopnea  R06.01 DG Chest 2 View    US THORACENTESIS ASP PLEURAL SPACE W/IMG GUIDE   Brief orthopnea when first laying down that resolves, he has not sleeping propped up on pillows or in a recliner?  May be pleural effusion only or some HF?  9. Elevated liver function tests  Y85.02 COMPLETE METABOLIC PANEL WITH GFR    Lipid panel    US THORACENTESIS ASP PLEURAL SPACE W/IMG GUIDE   Did trend back to normal will repeat today  10. Hyperlipidemia, unspecified hyperlipidemia type  D74.1 COMPLETE METABOLIC PANEL WITH GFR    Lipid panel   Check labs  11. Cough  R05 DG Chest 2 View    US THORACENTESIS ASP PLEURAL SPACE W/IMG GUIDE   Trial of inhalers that may be secondary to pleural effusion  12. Wheeze  R06.2 DG Chest 2 View    US THORACENTESIS ASP PLEURAL SPACE W/IMG GUIDE   See above  13. Pleural effusion on right  J90 Brain natriuretic peptide    US THORACENTESIS ASP PLEURAL SPACE W/IMG  GUIDE   Thoracentesis ordered hopefully will be done tomorrow, labs ordered, will follow closely in office on Monday  14. Hepatic steatosis  K76.0 US THORACENTESIS ASP PLEURAL SPACE W/IMG GUIDE   Per ultrasound done by GI  15. Alcohol abuse  F10.10 US THORACENTESIS ASP PLEURAL SPACE W/IMG GUIDE   6 beers a day encouraged him to continue to decrease  16. Barrett's esophagus without dysplasia  K22.70 US THORACENTESIS ASP PLEURAL SPACE W/IMG GUIDE   longstanding PPI tx, 2 year f/up EGD with GI (EGD 04/2019) -Per GI  17. Hypothyroidism, unspecified type  E03.9 TSH   discussed taking levothyroine in the am on empty stomach, take prilosec at night >2 h after last eating, refill pending TSH results      Delsa Grana, PA-C 12/27/19 11:39 AM

## 2019-12-27 NOTE — Addendum Note (Signed)
Addended by: Danelle Berry on: 12/27/2019 05:46 PM   Modules accepted: Orders

## 2019-12-27 NOTE — Telephone Encounter (Signed)
Per Danelle Berry I called the patient's pharmacy to cancel prescription of Lisinopril-hydrochlorothiazide 20-12.5. I spoke with the pharmacist and the prescription has been cancelled.

## 2019-12-28 ENCOUNTER — Other Ambulatory Visit: Admission: RE | Admit: 2019-12-28 | Payer: Medicaid Other | Source: Ambulatory Visit

## 2019-12-28 ENCOUNTER — Telehealth: Payer: Self-pay | Admitting: Family Medicine

## 2019-12-28 ENCOUNTER — Other Ambulatory Visit
Admission: RE | Admit: 2019-12-28 | Discharge: 2019-12-28 | Disposition: A | Discharge status: Home / Self Care | Payer: Medicaid Other | Source: Ambulatory Visit | Attending: Family Medicine | Admitting: Family Medicine

## 2019-12-28 ENCOUNTER — Ambulatory Visit: Admission: RE | Admit: 2019-12-28 | Payer: Medicaid Other | Source: Ambulatory Visit

## 2019-12-28 ENCOUNTER — Emergency Department
Admission: EM | Admit: 2019-12-28 | Discharge: 2019-12-28 | Disposition: A | Discharge status: Home / Self Care | Payer: Medicaid Other | Attending: Emergency Medicine | Admitting: Emergency Medicine

## 2019-12-28 ENCOUNTER — Encounter: Payer: Self-pay | Admitting: Emergency Medicine

## 2019-12-28 ENCOUNTER — Other Ambulatory Visit: Payer: Self-pay

## 2019-12-28 DIAGNOSIS — K760 Fatty (change of) liver, not elsewhere classified: Secondary | ICD-10-CM | POA: Diagnosis not present

## 2019-12-28 DIAGNOSIS — R05 Cough: Secondary | ICD-10-CM | POA: Insufficient documentation

## 2019-12-28 DIAGNOSIS — R7989 Other specified abnormal findings of blood chemistry: Secondary | ICD-10-CM | POA: Diagnosis not present

## 2019-12-28 DIAGNOSIS — R062 Wheezing: Secondary | ICD-10-CM | POA: Insufficient documentation

## 2019-12-28 DIAGNOSIS — Z79899 Other long term (current) drug therapy: Secondary | ICD-10-CM | POA: Insufficient documentation

## 2019-12-28 DIAGNOSIS — J90 Pleural effusion, not elsewhere classified: Secondary | ICD-10-CM

## 2019-12-28 DIAGNOSIS — K2270 Barrett's esophagus without dysplasia: Secondary | ICD-10-CM | POA: Diagnosis not present

## 2019-12-28 DIAGNOSIS — U071 COVID-19: Secondary | ICD-10-CM | POA: Diagnosis not present

## 2019-12-28 DIAGNOSIS — R0602 Shortness of breath: Secondary | ICD-10-CM | POA: Insufficient documentation

## 2019-12-28 DIAGNOSIS — R0601 Orthopnea: Secondary | ICD-10-CM | POA: Diagnosis not present

## 2019-12-28 DIAGNOSIS — I10 Essential (primary) hypertension: Secondary | ICD-10-CM | POA: Diagnosis not present

## 2019-12-28 LAB — CBC WITH DIFFERENTIAL/PLATELET
Absolute Monocytes: 903 cells/uL (ref 200–950)
Basophils Absolute: 43 cells/uL (ref 0–200)
Basophils Relative: 0.5 %
Eosinophils Absolute: 172 cells/uL (ref 15–500)
Eosinophils Relative: 2 %
HCT: 47.9 % (ref 38.5–50.0)
Hemoglobin: 16.3 g/dL (ref 13.2–17.1)
Lymphs Abs: 1032 cells/uL (ref 850–3900)
MCH: 31 pg (ref 27.0–33.0)
MCHC: 34 g/dL (ref 32.0–36.0)
MCV: 91.2 fL (ref 80.0–100.0)
MPV: 10.3 fL (ref 7.5–12.5)
Monocytes Relative: 10.5 %
Neutro Abs: 6450 cells/uL (ref 1500–7800)
Neutrophils Relative %: 75 %
Platelets: 282 10*3/uL (ref 140–400)
RBC: 5.25 10*6/uL (ref 4.20–5.80)
RDW: 12.7 % (ref 11.0–15.0)
Total Lymphocyte: 12 %
WBC: 8.6 10*3/uL (ref 3.8–10.8)

## 2019-12-28 LAB — COMPLETE METABOLIC PANEL WITH GFR
AG Ratio: 1.5 (calc) (ref 1.0–2.5)
ALT: 31 U/L (ref 9–46)
AST: 19 U/L (ref 10–35)
Albumin: 4.1 g/dL (ref 3.6–5.1)
Alkaline phosphatase (APISO): 41 U/L (ref 35–144)
BUN: 13 mg/dL (ref 7–25)
CO2: 25 mmol/L (ref 20–32)
Calcium: 9.3 mg/dL (ref 8.6–10.3)
Chloride: 100 mmol/L (ref 98–110)
Creat: 0.96 mg/dL (ref 0.70–1.33)
GFR, Est African American: 106 mL/min/{1.73_m2} (ref >=60)
GFR, Est Non African American: 91 mL/min/{1.73_m2} (ref >=60)
Globulin: 2.8 g/dL (calc) (ref 1.9–3.7)
Glucose, Bld: 99 mg/dL (ref 65–99)
Potassium: 5 mmol/L (ref 3.5–5.3)
Sodium: 134 mmol/L — ABNORMAL LOW (ref 135–146)
Total Bilirubin: 2.3 mg/dL — ABNORMAL HIGH (ref 0.2–1.2)
Total Protein: 6.9 g/dL (ref 6.1–8.1)

## 2019-12-28 LAB — LIPID PANEL
Cholesterol: 181 mg/dL (ref <200)
HDL: 52 mg/dL (ref >=40)
LDL Cholesterol (Calc): 112 mg/dL (calc) — ABNORMAL HIGH
Non-HDL Cholesterol (Calc): 129 mg/dL (calc) (ref <130)
Total CHOL/HDL Ratio: 3.5 (calc) (ref <5.0)
Triglycerides: 79 mg/dL (ref <150)

## 2019-12-28 LAB — RESPIRATORY PANEL BY RT PCR (FLU A&B, COVID)
Influenza A by PCR: NEGATIVE
Influenza B by PCR: NEGATIVE
SARS Coronavirus 2 by RT PCR: POSITIVE — AB

## 2019-12-28 LAB — TSH: TSH: 4.54 mIU/L — ABNORMAL HIGH (ref 0.40–4.50)

## 2019-12-28 NOTE — ED Provider Notes (Signed)
[facility] Emergency Department Provider Note  ____________________________________________  Time seen: Approximately 6:53 PM  I have reviewed the triage vital signs and the nursing notes.   HISTORY  Chief Complaint Follow-up    HPI Dean Anderson is a 54 y.o. male who presents the emergency department for evaluation.  Patient was scheduled for paracentesis today for significant pulmonary effusion to the right lung.  Patient had a negative Covid test last week, however preprocedural testing today revealed that patient was positive for COVID-19.  He has had no fevers or chills, nasal congestion, sore throat, dental pain, nausea vomiting, diarrhea or constipation.  Patient does have chronic shortness of breath and cough which has been a chronic issue with his pulmonary effusion.  He denies any increase or change from baseline.  He states that with the positive Covid finding with his current medical history he was advised to follow-up here in the emergency department as he was already here at the hospital.  Patient denies any complaints at this time.  He is more concerned at this time for how long it will delay his procedure.         Past Medical History:  Diagnosis Date  . Anemia   . Atrial fibrillation ([place])   . GERD (gastroesophageal reflux disease)   . Hypertension   . Thyroid disease     Patient Active Problem List   Diagnosis Date Noted  . Iron deficiency anemia due to chronic blood loss   . Acute gastritis without hemorrhage   . Hematochezia   . Shortness of breath 10/02/2018    Past Surgical History:  Procedure Laterality Date  . COLONOSCOPY WITH PROPOFOL N/A 04/17/2019   Procedure: COLONOSCOPY WITH PROPOFOL;  Surgeon: Lucilla Lame, MD;  Location: [facility] ENDOSCOPY;  Service: Endoscopy;  Laterality: N/A;  . ESOPHAGOGASTRODUODENOSCOPY (EGD) WITH PROPOFOL N/A 04/17/2019   Procedure: ESOPHAGOGASTRODUODENOSCOPY (EGD) WITH PROPOFOL;  Surgeon: Lucilla Lame, MD;  Location: [facility] ENDOSCOPY;  Service: Endoscopy;  Laterality: N/A;  . GIVENS CAPSULE STUDY  05/2019   negative    Prior to Admission medications   Medication Sig Start Date End Date Taking? Authorizing Provider  albuterol (VENTOLIN HFA) 108 (90 Base) MCG/ACT inhaler Inhale 2 puffs into the lungs every 6 (six) hours as needed for wheezing or shortness of breath.    [provider]  budesonide-formoterol (SYMBICORT) 160-4.5 MCG/ACT inhaler Inhale 2 puffs into the lungs 2 (two) times daily. 12/27/19   Delsa Grana, PA-C  FEROSUL 325 (65 Fe) MG tablet Take 325 mg by mouth as needed.  03/22/19   [provider]  fluticasone (FLONASE) 50 MCG/ACT nasal spray Place 2 sprays into both nostrils as needed.  04/04/19   [provider]  levothyroxine (SYNTHROID) 75 MCG tablet TAKE 1 TABLET BY MOUTH ONCE DAILY FOR LOW THYROID 03/22/19   [provider]  lisinopril (ZESTRIL) 20 MG tablet Take 1 tablet (20 mg total) by mouth daily. 12/27/19   Delsa Grana, PA-C  metoprolol tartrate (LOPRESSOR) 25 MG tablet TAKE 3 TABLETS BY MOUTH TWICE DAILY FOR HIGH BLOOD PRESSURE 03/22/19   [provider]  omeprazole (PRILOSEC) 20 MG capsule Take 1 capsule (20 mg total) by mouth at bedtime. 12/27/19   Delsa Grana, PA-C    Allergies Patient has no known allergies.  Family History  Problem Relation Age of Onset  . Hypertension Mother   . Thyroid disease Mother   . Stroke Father 11  . Liver cancer Maternal Uncle   . Heart  disease Brother   . Hypertension Brother     Social History Social History   Tobacco Use  . Smoking status: Never Smoker  . Smokeless tobacco: Never Used  Substance Use Topics  . Alcohol use: Yes    Alcohol/week: 70.0 standard drinks    Types: 70 Cans of beer per week    Comment: 6 beers a day  . Drug use: Not Currently    Comment: "rare-once a year"      Review of Systems  Constitutional: No fever/chills Eyes: No visual changes. No  discharge ENT: No upper respiratory complaints. Cardiovascular: no chest pain. Respiratory: Chronic cough and shortness of breath, no change from baseline Gastrointestinal: No abdominal pain.  No nausea, no vomiting.  No diarrhea.  No constipation. Musculoskeletal: Negative for musculoskeletal pain. Skin: Negative for rash, abrasions, lacerations, ecchymosis. Neurological: Negative for headaches, focal weakness or numbness. 10-point ROS otherwise negative.  ____________________________________________   PHYSICAL EXAM:  VITAL SIGNS: ED Triage Vitals  Enc Vitals Group     BP 12/28/19 1601 (!) 190/118     Pulse Rate 12/28/19 1601 87     Resp 12/28/19 1601 16     Temp 12/28/19 1601 98.7 F (37.1 C)     Temp Source 12/28/19 1601 Oral     SpO2 12/28/19 1601 98 %     Weight 12/28/19 1602 210 lb (95.3 kg)     Height 12/28/19 1602 5\' 8"  (1.727 m)     Head Circumference --      Peak Flow --      Pain Score 12/28/19 1601 0     Pain Loc --      Pain Edu? --      Excl. in GC? --      Constitutional: Alert and oriented. Well appearing and in no acute distress. Eyes: Conjunctivae are normal. PERRL. EOMI. Head: Atraumatic. ENT:      Ears:       Nose: No congestion/rhinnorhea.      Mouth/Throat: Mucous membranes are moist.  Neck: No stridor.    Cardiovascular: Normal rate, regular rhythm. Normal S1 and S2.  Good peripheral circulation. Respiratory: Normal respiratory effort without tachypnea or retractions. Lungs CTAB. Good air entry to the bases with no decreased or absent breath sounds. Musculoskeletal: Full range of motion to all extremities. No gross deformities appreciated. Neurologic:  Normal speech and language. No gross focal neurologic deficits are appreciated.  Skin:  Skin is warm, dry and intact. No rash noted. Psychiatric: Mood and affect are normal. Speech and behavior are normal. Patient exhibits appropriate insight and  judgement.   ____________________________________________   LABS (all labs ordered are listed, but only abnormal results are displayed)  Labs Reviewed - No data to display ____________________________________________  EKG   ____________________________________________  RADIOLOGY   I reviewed patient's outside x-ray from yesterday.  Pulmonary effusion to the right lung.  No results found.  ____________________________________________    PROCEDURES  Procedure(s) performed:    Procedures    Medications - No data to display   ____________________________________________   INITIAL IMPRESSION / ASSESSMENT AND PLAN / ED COURSE  Pertinent labs & imaging results that were available during my care of the patient were reviewed by me and considered in my medical decision making (see chart for details).  Review of the [place] CSRS was performed in accordance of the [facility] prior to dispensing any controlled drugs.           Patient's diagnosis is consistent with COVID-19,  pulmonary effusion to the right lung.  Patient presented to the emergency department for evaluation after a positive Covid test today.  Patient denies any symptoms and states that he feels at his baseline.  Patient does have some chronic shortness of breath and cough that has been progressively worsening over the past several months.  Patient was being evaluated and worked up for pulmonary effusion and had been scheduled for paracentesis today.  Patient had a rapid 2-hour preprocedural Covid test today which returned positive.  Patient denies any fevers or chills, nasal congestion, sore throat.  Chronic cough and shortness of breath that is unchanged, no diarrhea or constipation.  At this time, patient is already on steroids, has albuterol inhaler for chronic shortness of breath.  At this time there is no indication for further work-up as patient has no other symptoms other than chronic symptoms.  I discussed at  length with this medical history, who diagnosis of Covid the possible complications and the reasons to return to the emergency department.  Patient verbalizes understanding of likely complications and reasons to return.  Patient states that if he has any worsening he will return for evaluation.  Patient is given ED precautions to return to the ED for any worsening or new symptoms.     ____________________________________________  FINAL CLINICAL IMPRESSION(S) / ED DIAGNOSES  Final diagnoses:  COVID-19  Pleural effusion      NEW MEDICATIONS STARTED DURING THIS VISIT:  ED Discharge Orders    None          This chart was dictated using voice recognition software/Dragon. Despite best efforts to proofread, errors can occur which can change the meaning. Any change was purely unintentional.    Racheal Patches, PA-C 12/28/19 1859    Phineas Semen, MD 12/28/19 Mikle Bosworth

## 2019-12-28 NOTE — ED Triage Notes (Signed)
Pt to ED via POV. Pt states that he was supposed to have a thoracentesis done. Pt had COVID test for procedure and it came back positive. Pt states that he was tested last week as well and was negative. Pt states that he does have some shortness of breath but that he is at his baseline. Pt denies any symptoms of COVID and states that his PCP sent him here to make sure he was "stable enough to go home". Pt VSS in triage and patient is in NAD.

## 2019-12-28 NOTE — Telephone Encounter (Signed)
Pt also called, and was given for positive results and directed to go to ER

## 2019-12-28 NOTE — Telephone Encounter (Signed)
  Pt calling for covid results,positive; tested for pre-procedure:  Reviewed positive covid 19 results with patient. Patient states he is symptomatic. Reviewed quarantine precautions; self isolate for 10 days from onset of symptoms, ot test date, with 3 consecutive days fever free without fever reducing medications. Any respiratory symptoms should be resolved at that time as well. Leave home for medical issues only. Treat any symptoms with over the counter medications. Reviewed household precautions and preventive care measures, including: frequent hand-washing, wiping down of high touch areas ie: doorknobs, counter tops. Isolate, distance from rest of household. Any household members must quarantine as well for 14 days from your test date. Reviewed symptoms which warrant an ED visit. Pt verbalizes understanding.   Will alert [facility]

## 2019-12-28 NOTE — ED Triage Notes (Signed)
FIRST NURSE NOTE- pt reports he had a pre procedure covid test and they called him to tell him he was positive.  Pt states "I have fluid on my lung and they were supposed to remove it, and now they are not doing it because of covid and told me to come get checked out to make sure safe to go home". Pt denies covid sx; SHOB r/t to fluid on lung but no new sx

## 2019-12-31 ENCOUNTER — Encounter: Payer: Self-pay | Admitting: Family Medicine

## 2019-12-31 MED ORDER — POTASSIUM CHLORIDE CRYS ER 20 MEQ PO TBCR: 20.0000 meq | EXTENDED_RELEASE_TABLET | Freq: Every day | ORAL | 0 refills | Status: DC

## 2019-12-31 MED ORDER — FUROSEMIDE 20 MG PO TABS: 20.0000 mg | ORAL_TABLET | Freq: Every day | ORAL | 0 refills | Status: DC

## 2019-12-31 NOTE — Addendum Note (Signed)
Addended by: Danelle Berry on: 12/31/2019 06:37 PM   Modules accepted: Orders

## 2019-12-31 NOTE — Progress Notes (Signed)
Pt Covid positive, was sent to the ER but with stable vital signs and patient had virtually no complaints he was sent home without any additional labs or imaging.  Thoracentesis will not be done until 10 days after Covid test  Blood pressure was very high in the ER but did improve at the time of discharge  Did also receive and reviewed patient's recent office visits from his past PCP.  On January 11 he complained via virtual encounter to the PA at his PCP office that he had 1 to 2 weeks of worsening shortness of breath and the symptoms were endorsed to be new, at that time was the first time he was given an inhaler he was treated with steroids and he had a Covid test which was subsequently negative.  He followed up in our office roughly 9 days later with continued symptoms with orthopnea, dry cough and wheeze which during the daytime was relieved with his inhaler, he had not tried it at night for worsening shortness of breath when he laid down.  He was found to have large right-sided pleural effusion and was Covid positive.  At the time of our visit he was in persistent A. fib rate controlled with metoprolol, and walk around the clinic he did not desaturate or have any respiratory distress.  In the ER last Friday his respiratory rate and oxygen saturations remained stable on room air.    Very concerned that I do not know the cause of his pleural effusion, do suspect that it may have come on gradually because he has had no respiratory distress..  May be multifactorial possibly have some Covid pneumonia or contributing factors?, EtOH abuse, poorly controlled blood pressure with persistent A. fib and lack of atrial kick may be some underlying heart failure causing pleural effusion.  Will see if a few days of lasix can decrease fluid, see if I can order outpt CT chest or CT angio   Will send communication to his cardiologist for follow-up of blood pressures persistent A. fib and possible CHF?  Pt  will be notified tomorrow about plan and I've requested that he do a f/up visit soon to recheck sx and BP.  Danelle Berry, PA-C 6:32 PM 12/31/19

## 2020-01-01 ENCOUNTER — Other Ambulatory Visit: Payer: Self-pay | Admitting: Family Medicine

## 2020-01-01 DIAGNOSIS — E039 Hypothyroidism, unspecified: Secondary | ICD-10-CM

## 2020-01-01 MED ORDER — LEVOTHYROXINE SODIUM 88 MCG PO TABS: 88.0000 ug | ORAL_TABLET | Freq: Every day | ORAL | 1 refills | Status: DC

## 2020-01-01 NOTE — Telephone Encounter (Signed)
Medication Refill - Medication: Synthroid . He said his prescription has changed based on his recent labs  Has the patient contacted their pharmacy? No. (Agent: If no, request that the patient contact the pharmacy for the refill.) (Agent: If yes, when and what did the pharmacy advise?)  Preferred Pharmacy (with phone number or street name): [facility]  [address]  Agent: Please be advised that RX refills may take up to 3 business days. We ask that you follow-up with your pharmacy.

## 2020-01-01 NOTE — Telephone Encounter (Signed)
Pt needs refill with new dosage and how to take per labs

## 2020-01-02 ENCOUNTER — Other Ambulatory Visit: Payer: Self-pay

## 2020-01-02 ENCOUNTER — Ambulatory Visit: Payer: Medicaid Other | Admitting: Family Medicine

## 2020-01-02 ENCOUNTER — Ambulatory Visit
Admission: RE | Admit: 2020-01-02 | Discharge: 2020-01-02 | Disposition: A | Discharge status: Home / Self Care | Payer: Medicaid Other | Source: Ambulatory Visit | Attending: Family Medicine | Admitting: Family Medicine

## 2020-01-02 DIAGNOSIS — R0602 Shortness of breath: Secondary | ICD-10-CM | POA: Insufficient documentation

## 2020-01-02 DIAGNOSIS — I4819 Other persistent atrial fibrillation: Secondary | ICD-10-CM | POA: Diagnosis not present

## 2020-01-02 DIAGNOSIS — R05 Cough: Secondary | ICD-10-CM | POA: Insufficient documentation

## 2020-01-02 DIAGNOSIS — J90 Pleural effusion, not elsewhere classified: Secondary | ICD-10-CM | POA: Insufficient documentation

## 2020-01-02 DIAGNOSIS — R062 Wheezing: Secondary | ICD-10-CM | POA: Diagnosis not present

## 2020-01-02 DIAGNOSIS — R0601 Orthopnea: Secondary | ICD-10-CM | POA: Diagnosis not present

## 2020-01-02 DIAGNOSIS — R0600 Dyspnea, unspecified: Secondary | ICD-10-CM | POA: Insufficient documentation

## 2020-01-02 MED ORDER — IOHEXOL 350 MG/ML SOLN
75.0000 mL | Freq: Once | INTRAVENOUS | Status: AC | PRN
  Administered 2020-01-02: 11:00:00 75 mL via INTRAVENOUS

## 2020-01-02 NOTE — Telephone Encounter (Signed)
Pt.notified

## 2020-01-03 ENCOUNTER — Ambulatory Visit: Payer: Medicaid Other

## 2020-01-07 ENCOUNTER — Encounter: Payer: Self-pay | Admitting: Family Medicine

## 2020-01-07 ENCOUNTER — Ambulatory Visit
Admission: RE | Admit: 2020-01-07 | Discharge: 2020-01-07 | Disposition: A | Discharge status: Home / Self Care | Payer: Medicaid Other | Source: Ambulatory Visit | Attending: Family Medicine | Admitting: Family Medicine

## 2020-01-07 ENCOUNTER — Ambulatory Visit (INDEPENDENT_AMBULATORY_CARE_PROVIDER_SITE_OTHER): Payer: Medicaid Other | Admitting: Family Medicine

## 2020-01-07 ENCOUNTER — Other Ambulatory Visit
Admission: RE | Admit: 2020-01-07 | Discharge: 2020-01-07 | Disposition: A | Discharge status: Home / Self Care | Payer: Medicaid Other | Source: Ambulatory Visit | Attending: Family Medicine | Admitting: Family Medicine

## 2020-01-07 VITALS — BP 148/105 | HR 93 | Ht 68.0 in | Wt 198.0 lb

## 2020-01-07 DIAGNOSIS — Z5181 Encounter for therapeutic drug level monitoring: Secondary | ICD-10-CM

## 2020-01-07 DIAGNOSIS — R0601 Orthopnea: Secondary | ICD-10-CM | POA: Diagnosis not present

## 2020-01-07 DIAGNOSIS — I10 Essential (primary) hypertension: Secondary | ICD-10-CM

## 2020-01-07 DIAGNOSIS — I4819 Other persistent atrial fibrillation: Secondary | ICD-10-CM

## 2020-01-07 DIAGNOSIS — U071 COVID-19: Secondary | ICD-10-CM | POA: Diagnosis not present

## 2020-01-07 DIAGNOSIS — R0600 Dyspnea, unspecified: Secondary | ICD-10-CM

## 2020-01-07 DIAGNOSIS — R0609 Other forms of dyspnea: Secondary | ICD-10-CM

## 2020-01-07 DIAGNOSIS — J90 Pleural effusion, not elsewhere classified: Secondary | ICD-10-CM

## 2020-01-07 DIAGNOSIS — J988 Other specified respiratory disorders: Secondary | ICD-10-CM

## 2020-01-07 LAB — BRAIN NATRIURETIC PEPTIDE: B Natriuretic Peptide: 156 pg/mL — ABNORMAL HIGH (ref 0.0–100.0)

## 2020-01-07 MED ORDER — METOPROLOL TARTRATE 25 MG PO TABS: ORAL_TABLET | ORAL | 0 refills | Status: DC

## 2020-01-07 NOTE — Progress Notes (Signed)
Name: Dean Anderson   MRN: [id number]    DOB: 04-25-1966   Date:01/07/2020       Progress Note  Subjective:    Chief Complaint  Chief Complaint  Patient presents with  . Follow-up    covid for fluid on lungs, sob better  . Medication Refill    I connected with  [name] on 01/07/20 at  1:40 PM EST by telephone and verified that I am speaking with the correct person using two identifiers.   I discussed the limitations, risks, security and privacy concerns of performing an evaluation and management service by telephone and the availability of in person appointments. Staff also discussed with the patient that there may be a patient responsible charge related to this service. Patient Location: home Provider Location: [facility] clinic  Additional Individuals present: none  HPI  HTN f/up -  He was on metoprolol 75 mg BID, from cardiology for persistent aFib, pt needs refill Taking lisinopril 20 mg daily - new med, also did about 5 days of lasix 20 mg with potassium   SBP 140-150 over the past week, he states that's improved from 180-190, which he did not tell me before He indorses improving orthopnea, much easier to breath laying down and less SOB, he has also lost ~10-12 lbs.  He did have some edema to b/l LE at our visit, he states that is gone/better.  He continues to have some exertional dyspnea but that is also getting better and almost back to his baseline.  Very rare cough or wheeze now.    He has not called cardiology yet  COVID + 12/28/2019 - no fever, chills, sweats, HA, sore throat body aches Sx from worsening respiratory sx around the beginning of the year - 1/11     Patient Active Problem List   Diagnosis Date Noted  . Iron deficiency anemia due to chronic blood loss   . Acute gastritis without hemorrhage   . Hematochezia   . Shortness of breath 10/02/2018    Social History   Tobacco Use  . Smoking status: Never Smoker  . Smokeless tobacco: Never Used    Substance Use Topics  . Alcohol use: Yes    Alcohol/week: 70.0 standard drinks    Types: 70 Cans of beer per week    Comment: 6 beers a day     Current Outpatient Medications:  .  albuterol (VENTOLIN HFA) 108 (90 Base) MCG/ACT inhaler, Inhale 2 puffs into the lungs every 6 (six) hours as needed for wheezing or shortness of breath., Disp: , Rfl:  .  budesonide-formoterol (SYMBICORT) 160-4.5 MCG/ACT inhaler, Inhale 2 puffs into the lungs 2 (two) times daily., Disp: 1 Inhaler, Rfl: 3 .  levothyroxine (SYNTHROID) 88 MCG tablet, Take 1 tablet (88 mcg total) by mouth daily before breakfast., Disp: 90 tablet, Rfl: 1 .  lisinopril (ZESTRIL) 20 MG tablet, Take 1 tablet (20 mg total) by mouth daily., Disp: 90 tablet, Rfl: 3 .  metoprolol tartrate (LOPRESSOR) 25 MG tablet, TAKE 3 TABLETS BY MOUTH TWICE DAILY FOR HIGH BLOOD PRESSURE, Disp: , Rfl:  .  omeprazole (PRILOSEC) 20 MG capsule, Take 1 capsule (20 mg total) by mouth at bedtime., Disp: 90 capsule, Rfl: 1 .  potassium chloride SA (KLOR-CON) 20 MEQ tablet, Take 1 tablet (20 mEq total) by mouth daily. Take with lasix, hold when not taking lasix, Disp: 30 tablet, Rfl: 0 .  FEROSUL 325 (65 Fe) MG tablet, Take 325 mg by mouth as needed. ,  Disp: , Rfl:  .  fluticasone (FLONASE) 50 MCG/ACT nasal spray, Place 2 sprays into both nostrils as needed. , Disp: , Rfl:  .  furosemide (LASIX) 20 MG tablet, Take 1 tablet (20 mg total) by mouth daily for 5 days., Disp: 30 tablet, Rfl: 0  No Known Allergies  Chart Review: Reviewed last visit, his labs, CT, Xray Brief review of meds, allergies, no changes in the past 1-2 weeks to SH/FHx  Review of Systems  Constitutional: Negative.   HENT: Negative.   Eyes: Negative.   Respiratory: Negative.   Cardiovascular: Negative.   Gastrointestinal: Negative.   Endocrine: Negative.   Genitourinary: Negative.   Musculoskeletal: Negative.   Skin: Negative.   Allergic/Immunologic: Negative.   Neurological: Negative.    Hematological: Negative.   Psychiatric/Behavioral: Negative.   All other systems reviewed and are negative.    Objective:    Virtual encounter, vitals limited, only able to obtain the following Today's Vitals   01/07/20 1322  BP: (!) 148/105  Pulse: 93  Weight: 198 lb (89.8 kg)  Height: 5\' 8"  (1.727 m)   Body mass index is 31.93 kg/m. Nursing Note and Vital Signs reviewed.  Physical Exam PT alert, answering questions appropriately, phonation clear, able to speak in full and complete sentences, no audible wheeze, tachypnea, stridor PE limited by telephone encounter  No results found for this or any previous visit (from the past 72 hour(s)).  Assessment and Plan:     ICD-10-CM   1. Orthopnea  R06.01 DG Chest 2 View    Brain natriuretic peptide   still monitoring orthopnea from pleural effusion, I suspect it is transudative from likely heart failure, not able to do thorocentesis, sx improving   2. Pleural effusion on right  J90 DG Chest 2 View    Brain natriuretic peptide   see above - will do f/up CXR and monitor renal function with lasix and still high BP with persistant Afib  3. DOE (dyspnea on exertion)  R06.00 DG Chest 2 View    Brain natriuretic peptide   improving, wheeze has improved, cough improving, he is using the new maintenence inhalers, rarely using SABA now - may have been cardiac wheeze   4. Hypertension, unspecified type  I10 COMPLETE METABOLIC PANEL WITH GFR   not yet at goal, will continue lisinopril and BB for now while I recheck labs, close f/up.    5. Persistent atrial fibrillation (HCC)  I48.19 metoprolol tartrate (LOPRESSOR) 25 MG tablet   Still trying to connect with cardiology, pt does not believe he had ECHO done - he did have GI bleed then eval for surgery, ablation postponed?    6. Encounter for medication monitoring  Z51.81 Brain natriuretic peptide    COMPLETE METABOLIC PANEL WITH GFR   Pt reports feeling much better, COVID sx if he had any  (cough wheeze URI sx 3 weeks ago?) are improving, no fever, CP and SOB is improving.  Orthopnea, LE edema all significantly improving and he has gotten about 12 lbs off of fluids - likely CHF  Plan to recheck labs and CXR - see if we can set him back up for thoracentesis now that it is 10 d past his positive covid test and he is afebrile and sx improving.  Pt has the next week off from work declines a work note today.   We will be following up closely with him.  I have sent a few messages to and routed charts to his cardiologist to  see if they are able to see him for follow up, ask if he will review current labs and imaging etc - so see if they may suspect pleural effusion may be cardiac in nature? I have not yet heard anything.  Encouraged pt to call his cardiologist for f/up.  Pt to come in here for close f/up in person 01/28/2020 per [facility] covid + pt policy.  -Red flags and when to present for emergency care or RTC including but not limited to new/worsening/un-resolving symptoms, reviewed with patient at time of visit. Follow up and care instructions discussed and provided in AVS. - I discussed the assessment and treatment plan with the patient. The patient was provided an opportunity to ask questions and all were answered. The patient agreed with the plan and demonstrated an understanding of the instructions.  - The patient was advised to call back or seek an in-person evaluation if the symptoms worsen or if the condition fails to improve as anticipated.  I provided 20 minutes of non-face-to-face time during this encounter.  Delsa Grana, PA-C 01/07/20 1:54 PM

## 2020-01-08 ENCOUNTER — Other Ambulatory Visit: Payer: Medicaid Other

## 2020-01-08 LAB — COMPREHENSIVE METABOLIC PANEL
ALT: 26 U/L (ref 0–44)
AST: 23 U/L (ref 15–41)
Albumin: 3.9 g/dL (ref 3.5–5.0)
Alkaline Phosphatase: 44 U/L (ref 38–126)
Anion gap: 11 (ref 5–15)
BUN: 9 mg/dL (ref 6–20)
CO2: 27 mmol/L (ref 22–32)
Calcium: 9.4 mg/dL (ref 8.9–10.3)
Chloride: 94 mmol/L — ABNORMAL LOW (ref 98–111)
Creatinine, Ser: 0.9 mg/dL (ref 0.61–1.24)
GFR calc Af Amer: >60 mL/min (ref >60)
GFR calc non Af Amer: >60 mL/min (ref >60)
Glucose, Bld: 72 mg/dL (ref 70–99)
Potassium: 5.4 mmol/L — ABNORMAL HIGH (ref 3.5–5.1)
Sodium: 132 mmol/L — ABNORMAL LOW (ref 135–145)
Total Bilirubin: 2.4 mg/dL — ABNORMAL HIGH (ref 0.3–1.2)
Total Protein: 7.4 g/dL (ref 6.5–8.1)

## 2020-01-10 ENCOUNTER — Encounter: Admission: RE | Payer: Self-pay | Source: Home / Self Care

## 2020-01-10 ENCOUNTER — Ambulatory Visit: Admission: RE | Admit: 2020-01-10 | Payer: Medicaid Other | Source: Home / Self Care | Admitting: Surgery

## 2020-01-10 ENCOUNTER — Encounter: Payer: Self-pay | Admitting: Family Medicine

## 2020-01-10 SURGERY — REPAIR, HERNIA, PARAESOPHAGEAL, LAPAROSCOPIC
Anesthesia: General

## 2020-01-11 ENCOUNTER — Encounter: Payer: Self-pay | Admitting: Family Medicine

## 2020-01-11 DIAGNOSIS — J90 Pleural effusion, not elsewhere classified: Secondary | ICD-10-CM

## 2020-01-11 HISTORY — DX: Pleural effusion, not elsewhere classified: J90

## 2020-01-28 ENCOUNTER — Ambulatory Visit
Admission: RE | Admit: 2020-01-28 | Discharge: 2020-01-28 | Disposition: A | Discharge status: Home / Self Care | Payer: Medicaid Other | Source: Ambulatory Visit | Attending: Family Medicine | Admitting: Family Medicine

## 2020-01-28 ENCOUNTER — Other Ambulatory Visit: Payer: Self-pay

## 2020-01-28 ENCOUNTER — Ambulatory Visit (INDEPENDENT_AMBULATORY_CARE_PROVIDER_SITE_OTHER): Payer: Medicaid Other | Admitting: Family Medicine

## 2020-01-28 ENCOUNTER — Ambulatory Visit: Payer: Medicaid Other | Admitting: Surgery

## 2020-01-28 ENCOUNTER — Encounter: Payer: Self-pay | Admitting: Family Medicine

## 2020-01-28 VITALS — BP 142/94 | HR 79 | Temp 98.5°F | Resp 14 | Ht 68.0 in | Wt 211.1 lb

## 2020-01-28 DIAGNOSIS — Z5181 Encounter for therapeutic drug level monitoring: Secondary | ICD-10-CM | POA: Diagnosis not present

## 2020-01-28 DIAGNOSIS — R0609 Other forms of dyspnea: Secondary | ICD-10-CM

## 2020-01-28 DIAGNOSIS — K2900 Acute gastritis without bleeding: Secondary | ICD-10-CM | POA: Diagnosis not present

## 2020-01-28 DIAGNOSIS — I10 Essential (primary) hypertension: Secondary | ICD-10-CM

## 2020-01-28 DIAGNOSIS — R0600 Dyspnea, unspecified: Secondary | ICD-10-CM

## 2020-01-28 DIAGNOSIS — I4819 Other persistent atrial fibrillation: Secondary | ICD-10-CM

## 2020-01-28 DIAGNOSIS — D500 Iron deficiency anemia secondary to blood loss (chronic): Secondary | ICD-10-CM

## 2020-01-28 DIAGNOSIS — J90 Pleural effusion, not elsewhere classified: Secondary | ICD-10-CM

## 2020-01-28 NOTE — Patient Instructions (Signed)
Go get your Xray and we'll call you with your lab results tomorrow  We will likely restart the lasix once I know your kidney function is still normal.  Hopefully we can get you seen by cardiology very soon.    If you're not into cardiology in the next month then I do need to see you back here for follow up in one month.  We'll do a routine visit in 3 months   Pleural Effusion Pleural effusion is an abnormal buildup of fluid in the layers of tissue between the lungs and the inside of the chest (pleural space) The two layers of tissue that line the lungs and the inside of the chest are called pleura. Usually, there is no air in the space between the pleura, only a thin layer of fluid. Some conditions can cause a large amount of fluid to build up, which can cause the lung to collapse if untreated. A pleural effusion is usually caused by another disease that requires treatment. What are the causes? Pleural effusion can be caused by:  Heart failure.  Certain infections, such as pneumonia or tuberculosis.  Cancer.  A blood clot in the lung (pulmonary embolism).  Complications from surgery, such as from open heart surgery.  Liver disease (cirrhosis).  Kidney disease. What are the signs or symptoms? In some cases, pleural effusion may cause no symptoms. If symptoms are present, they may include:  Shortness of breath, especially when lying down.  Chest pain. This may get worse when taking a deep breath.  Fever.  Dry, long-lasting (chronic) cough.  Hiccups.  Rapid breathing. An underlying condition that is causing the pleural effusion (such as heart failure, pneumonia, blood clots, tuberculosis, or cancer) may also cause other symptoms. How is this diagnosed? This condition may be diagnosed based on:  Your symptoms and medical history.  A physical exam.  A chest X-ray.  A procedure to use a needle to remove fluid from the pleural space (thoracentesis). This fluid is  tested.  Other imaging studies of the chest, such as ultrasound or CT scan. How is this treated? Depending on the cause of your condition, treatment may include:  Treating the underlying condition that is causing the effusion. When that condition improves, the effusion will also improve. Examples of treatment for underlying conditions include: ? Antibiotic medicines to treat an infection. ? Diuretics or other heart medicines to treat heart failure.  Thoracentesis.  Placing a thin flexible tube under your skin and into your chest to continuously drain the effusion (indwelling pleural catheter).  Surgery to remove the outer layer of tissue from the pleural space (decortication).  A procedure to put medicine into the chest cavity to seal the pleural space and prevent fluid buildup (pleurodesis).  Chemotherapy and radiation therapy, if you have cancerous (malignant) pleural effusion. These treatments are typically used to treat cancer. They kill certain cells in the body. Follow these instructions at home:  Take over-the-counter and prescription medicines only as told by your health care provider.  Ask your health care provider what activities are safe for you.  Keep track of how long you are able to do mild exercise (such as walking) before you get short of breath. Write down this information to share with your health care provider. Your ability to exercise should improve over time.  Do not use any products that contain nicotine or tobacco, such as cigarettes and e-cigarettes. If you need help quitting, ask your health care provider.  Keep all follow-up  visits as told by your health care provider. This is important. Contact a health care provider if:  The amount of time that you are able to do mild exercise: ? Decreases. ? Does not improve with time.  You have a fever. Get help right away if:  You are short of breath.  You develop chest pain.  You develop a new  cough. Summary  Pleural effusion is an abnormal buildup of fluid in the layers of tissue between the lungs and the inside of the chest.  Pleural effusion can have many causes, including heart failure, pulmonary embolism, infections, or cancer.  Symptoms of pleural effusion can include shortness of breath, chest pain, fever, long-lasting (chronic) cough, hiccups, or rapid breathing.  Diagnosis often involves making images of the chest (such as with ultrasound or X-ray) and removing fluid (thoracentesis) to send for testing.  Treatment for pleural effusion depends on what underlying condition is causing it. This information is not intended to replace advice given to you by your health care provider. Make sure you discuss any questions you have with your health care provider. Document Revised: 11/04/2017 Document Reviewed: 07/28/2017 Elsevier Patient Education  [place].   Furosemide Oral Tablets What is this medicine? FUROSEMIDE (fyoor [place] se mide) is a diuretic. It helps you make more urine and to lose salt and excess water from your body. It treats swelling from heart, kidney, or liver disease. It also treats high blood pressure. This medicine may be used for other purposes; ask your health care provider or pharmacist if you have questions. COMMON BRAND NAME(S): Active-Medicated Specimen Kit, Delone, Diuscreen, Lasix, RX Specimen Collection Kit, Specimen Collection Kit, URINX Medicated Specimen Collection What should I tell my health care provider before I take this medicine? They need to know if you have any of these conditions:  abnormal blood electrolytes  diarrhea or vomiting  gout  heart disease  kidney disease, small amounts of urine, or difficulty passing urine  liver disease  thyroid disease  an unusual or allergic reaction to furosemide, sulfa drugs, other medicines, foods, dyes, or preservatives  pregnant or trying to get pregnant  breast-feeding How  should I use this medicine? Take this drug by mouth. Take it as directed on the prescription label at the same time every day. You can take it with or without food. If it upsets your stomach, take it with food. Keep taking it unless your health care provider tells you to stop. Talk to your health care provider about the use of this drug in children. Special care may be needed. Overdosage: If you think you have taken too much of this medicine contact a poison control center or emergency room at once. NOTE: This medicine is only for you. Do not share this medicine with others. What if I miss a dose? If you miss a dose, take it as soon as you can. If it is almost time for your next dose, take only that dose. Do not take double or extra doses. What may interact with this medicine?  aspirin and aspirin-like medicines  certain antibiotics  chloral hydrate  cisplatin  cyclosporine  digoxin  diuretics  laxatives  lithium  medicines for blood pressure  medicines that relax muscles for surgery  methotrexate  NSAIDs, medicines for pain and inflammation like ibuprofen, naproxen, or indomethacin  phenytoin  steroid medicines like prednisone or cortisone  sucralfate  thyroid hormones This list may not describe all possible interactions. Give your health care provider a  list of all the medicines, herbs, non-prescription drugs, or dietary supplements you use. Also tell them if you smoke, drink alcohol, or use illegal drugs. Some items may interact with your medicine. What should I watch for while using this medicine? Visit your doctor or health care provider for regular checks on your progress. Check your blood pressure regularly. Ask your doctor or health care provider what your blood pressure should be, and when you should contact him or her. If you are a diabetic, check your blood sugar as directed. This medicine may cause serious skin reactions. They can happen weeks to months after  starting the medicine. Contact your health care provider right away if you notice fevers or flu-like symptoms with a rash. The rash may be red or purple and then turn into blisters or peeling of the skin. Or, you might notice a red rash with swelling of the face, lips or lymph nodes in your neck or under your arms. You may need to be on a special diet while taking this medicine. Check with your doctor. Also, ask how many glasses of fluid you need to drink a day. You must not get dehydrated. You may get drowsy or dizzy. Do not drive, use machinery, or do anything that needs mental alertness until you know how this drug affects you. Do not stand or sit up quickly, especially if you are an older patient. This reduces the risk of dizzy or fainting spells. Alcohol can make you more drowsy and dizzy. Avoid alcoholic drinks. This medicine can make you more sensitive to the sun. Keep out of the sun. If you cannot avoid being in the sun, wear protective clothing and use sunscreen. Do not use sun lamps or tanning beds/booths. What side effects may I notice from receiving this medicine? Side effects that you should report to your doctor or health care professional as soon as possible:  blood in urine or stools  dry mouth  fever or chills  hearing loss or ringing in the ears  irregular heartbeat  muscle pain or weakness, cramps  rash, fever, and swollen lymph nodes  redness, blistering, peeling or loosening of the skin, including inside the mouth  skin rash  stomach upset, pain, or nausea  tingling or numbness in the hands or feet  unusually weak or tired  vomiting or diarrhea  yellowing of the eyes or skin Side effects that usually do not require medical attention (report to your doctor or health care professional if they continue or are bothersome):  headache  loss of appetite  unusual bleeding or bruising This list may not describe all possible side effects. Call your doctor for  medical advice about side effects. You may report side effects to FDA at 1-800-FDA-1088. Where should I keep my medicine? Keep out of the reach of children and pets. Store at room temperature between 20 and 25 degrees C (68 and 77 degrees F). Protect from light and moisture. Keep the container tightly closed. Throw away any unused drug after the expiration date. NOTE: This sheet is a summary. It may not cover all possible information. If you have questions about this medicine, talk to your doctor, pharmacist, or health care provider.  2020 Elsevier/Gold Standard (2019-07-10 18:01:32)  Heart Failure Medicines  Heart failure is a condition in which the heart cannot pump enough blood through the body. This can cause symptoms such as shortness of breath, fatigue, and confusion. There are two types of heart failure:  Heart failure with reduced  ejection fraction. In this type, the heart muscle is weak.  Heart failure with preserved ejection fraction. In this type, the heart muscle does not fill with blood properly and may be stiff. There is no cure for heart failure. However, being treated with medicines and following your health care provider's instructions about a healthy lifestyle can help you stay active, avoid problems, and live longer. Talk to your health care provider about all medicines that you are taking, how often you should take them, and what possible problems (side effects) they may cause. Talk with your health care provider if you have difficulty affording your medicines. What are some common medicines for heart failure? The medicines that are prescribed for you will depend on your symptoms, the type of heart failure you have, and the cause of your heart failure. In some cases, you may need to take more than one medicine. You will be prescribed the following medicines according to your type of heart failure: Heart failure with reduced ejection  fraction  Beta-blockers.  Angiotensin-converting enzyme (ACE) inhibitors.  Angiotensin II receptor blockers (ARBs).  Angiotensin receptor neprilysin inhibitors (ARNIs).  Aldosterone antagonists.  Diuretics.  Digoxin.  Nitrates. Heart failure with preserved ejection fraction  Medicines to control blood pressure, including: ? Beta-blockers. ? Angiotensin-converting enzyme (ACE) inhibitors. ? Angiotensin II receptor blockers (ARBs).  Diuretics.  Aldosterone antagonists. What should I know about beta-blockers?  These medicines lower your blood pressure and slow your heart rate. This helps to lessen your heart's workload.  They can help to relieve chest pain (angina).  They can help to improve your heart's ability to pump.  They may cause asthma attacks and shortness of breath.  Because these medicines slow your heart rate, it is important not to overwork yourself while exercising. Talk to your health care provider about what your target heart rate should be while you exercise.  These medicines can hide the symptoms of low blood sugar (glucose), which is also called hypoglycemia. If you have diabetes, make sure to check your blood glucose carefully. If you have hypoglycemia, talk to your health care provider about adjusting your medicines.  Beta-blockers may make you feel dizzy or light-headed at first. Do not drive or use heavy machinery when you first start these medicines. Ask your health care provider when it is safe for you to drive. What should I know about ACE inhibitors or ARBs?  These medicines help to widen arteries and veins. This action lowers your blood pressure and lessens the strain on your heart, making it easier for your heart to pump.  They can help to lessen the symptoms of heart failure.  ARBs are often used if a person cannot take ACE inhibitors.  ACE inhibitors may cause a dry cough.  In rare cases, ACE inhibitors and ARBs can cause swelling of the  tongue or lips, other swelling, taste problems, and skin rashes. If these symptoms occur, stop taking the medicines and contact your health care provider.  Do not take ACE inhibitors if you are pregnant or may become pregnant. These medicines can cause health problems in an unborn baby.  These medicines may cause dizziness. You may need regular checkups and blood tests to monitor how they are working. What should I know about ARNIs?  These medicines are a combination of an ARB and another medicine. They lower your blood pressure.  Side effects may include dry cough, dizziness, low blood pressure, and kidney problems.  Do not take ARNIs if you are already  taking ACE inhibitors or ARBs.  You may notice increased urination when taking these medicines.  ARNIs can raise the amount of potassium in the blood. Your potassium levels will be monitored regularly by your health care provider. What should I know about aldosterone antagonists?  They help the body to remove excess sodium through urination. This helps to lessen the amount of blood that the heart needs to pump.  They can also help to lower blood pressure and improve the heart's ability to pump blood.  They may cause dizziness, diarrhea, coughing, or flu-like symptoms.  They should not be used if you have type 2 diabetes.  They can raise the amount of potassium in the blood. Your potassium levels will be monitored regularly by your health care provider.  These medicines can make men's breasts large and tender. What should I know about diuretics?  Diuretics are medicines that help the body get rid of excess fluid through urination. They can also help lessen your heart's workload.  They help to lessen fluid buildup in the lungs, ankles, and feet.  They help to lower your blood pressure.  They can worsen problems with controlling urination (urinary incontinence).  They may cause dizziness, headaches, muscle cramps, and an upset  stomach.  They can cause weak muscles, dry mouth, or confusion. It is important to drink plenty of fluids while taking these medicines, especially while exercising or on hot days. What should I know about digoxin?  Digoxin helps the heart pump more blood efficiently. It also lowers your heart rate.  It can help ease heart failure symptoms and may be used if other medicines do not work.  It can also help with irregular heartbeat (arrhythmia).  It may cause stomach problems, fatigue, headache, drowsiness, or vision problems. What should I know about nitrates?  Nitrates relax the blood vessels and increase oxygen and blood supply to the heart. They also lower the blood pressure.  They are usually taken to lessen chest pain.  They may cause headaches, flushing, or irregular heartbeat. Summary  A healthy lifestyle and treatment with medicine will relieve symptoms of heart failure.  In some cases, you may need to take more than one medicine.  It is important to talk to your health care provider about how often you should take your medicines. Do not skip a dose or change your dosage.  Talk to your health care provider about possible side effects of these medicines. This information is not intended to replace advice given to you by your health care provider. Make sure you discuss any questions you have with your health care provider. Document Revised: 12/07/2017 Document Reviewed: 04/08/2017 Elsevier Patient Education  [place].

## 2020-01-28 NOTE — Progress Notes (Signed)
Name: Dean Anderson   MRN: [id number]    DOB: 11-02-1966   Date:01/28/2020       Progress Note  Chief Complaint  Patient presents with  . Follow-up    from Covid, feeling much better     Subjective:   Dean Anderson is a 54 y.o. male, presents to clinic for follow-up on uncontrolled hypertension, persistent A. fib, new right-sided pleural effusion, and recent Covid infection  Patient states that overall his respiratory symptoms have gotten much better he is not using a daily inhaler and is only using albuterol inhaler very infrequently he states that he only wheezes with a lot of physical activity, his SOB has gotten a lot better - esp when on lasix -however after our last appointment he only took Lasix for 2 more days, and that was after ensuring that his renal function was normal and he had some improvement with his pleural effusion it was getting smaller. He does not know if he has gained weight back but there is some increased swelling to his legs but he continues to endorse that orthopnea has gotten a lot better than our first appointment about a month ago, he denies any palpitations, near syncope, chest pain, cough has mostly improved and resolved.  His weights and vital signs show that his weight previously 211 212 and 210 when on Lasix for a week dropped down to 198 per his report at that time with a virtual encounter, today is back up to 211.  He states that his weight reading at home was when he was unclothed on his home scale, so is not sure how much his weight is actually changed being on and off Lasix.  Wt Readings from Last 5 Encounters:  01/28/20 211 lb 1.6 oz (95.8 kg)  01/07/20 198 lb (89.8 kg)  12/28/19 210 lb (95.3 kg)  12/27/19 212 lb 11.2 oz (96.5 kg)  11/19/19 211 lb 6.4 oz (95.9 kg)   BMI Readings from Last 5 Encounters:  01/28/20 32.10 kg/m  01/07/20 30.11 kg/m  12/28/19 31.93 kg/m  12/27/19 32.34 kg/m  11/19/19 32.14 kg/m   He has continued to take  lisinopril 20 mg and his metoprolol 75 mg twice daily, he unfortunately has not gotten back into his cardiologist the [facility] and he wishes to establish elsewhere.  Again pt has no hx of smoking, asthma, chronic bronchitis or COPD.  Prior to about a month ago he was never on inhalers before.  And he does feel much better and is not using inhalers frequently at all.  We reviewed his past presentation to the [facility] earlier in January which was for complaint of increased cough and shortness of breath wheeze did seem like it was treated like an acute viral illness when he was first given inhalers, subsequently he came here about a week later, was diagnosed with both Covid and pleural effusion, I added inhalers at that time due to concerns of increased respiratory symptoms secondary to Covid, unfortunately when he went to the ER was evaluated other places they had assumed that he had chronic lung disease.    Though he has struggled with some shortness of breath over the years he has been seeing cardiology for at least the past 3 years and this has been attributed to poorly controlled A. fib, several times in the past were made to arrange for him to have an ablation, he was on amiodarone in the past, but unfortunately some of his treatment with  cardiology was complicated by GI bleed, sudden drop in hemoglobin, worsening labs and symptoms and he was unable to proceed with an ablation and over the last year or 2 he has had persistently elevated blood pressure unfortunately not controlled with any other additional blood pressure medications, and he has been primarily rate controlled with metoprolol for the past 1 to 2 years, heart rate seems to run between 70 and 85.  Patient continues to endorse that he feels good on his current dose is not having palpitations, near syncope, PND, does still have a little bit of lower extremity edema and still a little bit of shortness of breath.  Patient  Active Problem List   Diagnosis Date Noted  . Pleural effusion on right 01/11/2020  . Iron deficiency anemia due to chronic blood loss   . Acute gastritis without hemorrhage   . Hematochezia   . Hypertension 10/02/2018  . Shortness of breath 10/02/2018  . Persistent atrial fibrillation (HCC) 09/21/2018    Past Surgical History:  Procedure Laterality Date  . COLONOSCOPY WITH PROPOFOL N/A 04/17/2019   Procedure: COLONOSCOPY WITH PROPOFOL;  Surgeon: Midge Minium, MD;  Location: [facility] ENDOSCOPY;  Service: Endoscopy;  Laterality: N/A;  . ESOPHAGOGASTRODUODENOSCOPY (EGD) WITH PROPOFOL N/A 04/17/2019   Procedure: ESOPHAGOGASTRODUODENOSCOPY (EGD) WITH PROPOFOL;  Surgeon: Midge Minium, MD;  Location: [facility] ENDOSCOPY;  Service: Endoscopy;  Laterality: N/A;  . GIVENS CAPSULE STUDY  05/2019   negative    Family History  Problem Relation Age of Onset  . Hypertension Mother   . Thyroid disease Mother   . Stroke Father 64  . Liver cancer Maternal Uncle   . Heart disease Brother   . Hypertension Brother     Social History   Tobacco Use  . Smoking status: Never Smoker  . Smokeless tobacco: Never Used  Substance Use Topics  . Alcohol use: Yes    Alcohol/week: 70.0 standard drinks    Types: 70 Cans of beer per week    Comment: 6 beers a day  . Drug use: Not Currently    Comment: "rare-once a year"       Current Outpatient Medications:  .  albuterol (VENTOLIN HFA) 108 (90 Base) MCG/ACT inhaler, Inhale 2 puffs into the lungs every 6 (six) hours as needed for wheezing or shortness of breath., Disp: , Rfl:  .  FEROSUL 325 (65 Fe) MG tablet, Take 325 mg by mouth as needed. , Disp: , Rfl:  .  levothyroxine (SYNTHROID) 88 MCG tablet, Take 1 tablet (88 mcg total) by mouth daily before breakfast., Disp: 90 tablet, Rfl: 1 .  lisinopril (ZESTRIL) 20 MG tablet, Take 1 tablet (20 mg total) by mouth daily., Disp: 90 tablet, Rfl: 3 .  metoprolol tartrate (LOPRESSOR) 25 MG tablet, TAKE 3 TABLETS BY MOUTH  TWICE DAILY FOR HIGH BLOOD PRESSURE, Disp: 180 tablet, Rfl: 0 .  omeprazole (PRILOSEC) 20 MG capsule, Take 1 capsule (20 mg total) by mouth at bedtime., Disp: 90 capsule, Rfl: 1 .  budesonide-formoterol (SYMBICORT) 160-4.5 MCG/ACT inhaler, Inhale 2 puffs into the lungs 2 (two) times daily. (Patient not taking: Reported on 01/28/2020), Disp: 1 Inhaler, Rfl: 3 .  fluticasone (FLONASE) 50 MCG/ACT nasal spray, Place 2 sprays into both nostrils as needed. , Disp: , Rfl:  .  furosemide (LASIX) 20 MG tablet, Take 1 tablet (20 mg total) by mouth daily for 5 days., Disp: 30 tablet, Rfl: 0 .  potassium chloride SA (KLOR-CON) 20 MEQ tablet, Take 1 tablet (20 mEq total)  by mouth daily. Take with lasix, hold when not taking lasix (Patient not taking: Reported on 01/28/2020), Disp: 30 tablet, Rfl: 0  No Known Allergies  Chart Review Today: I personally reviewed active problem list, medication list, allergies, family history, social history, health maintenance, notes from last encounter, lab results, imaging with the patient/caregiver today. No cardiology f/up yet  Review of Systems  10 Systems reviewed and are negative for acute change except as noted in the HPI.    Objective:    Vitals:   01/28/20 1454  BP: (!) 142/94  Pulse: 79  Resp: 14  Temp: 98.5 F (36.9 C)  SpO2: 98%  Weight: 211 lb 1.6 oz (95.8 kg)  Height: 5\' 8"  (1.727 m)    Body mass index is 32.1 kg/m.  Physical Exam Vitals and nursing note reviewed.  Constitutional:      General: He is not in acute distress.    Appearance: Normal appearance. He is well-developed. He is not ill-appearing, toxic-appearing or diaphoretic.     Interventions: Face mask in place.  HENT:     Head: Normocephalic and atraumatic.     Jaw: No trismus.     Right Ear: External ear normal.     Left Ear: External ear normal.  Eyes:     General: Lids are normal. No scleral icterus.    Conjunctiva/sclera: Conjunctivae normal.     Pupils: Pupils are  equal, round, and reactive to light.  Neck:     Trachea: Trachea and phonation normal. No tracheal deviation.  Cardiovascular:     Rate and Rhythm: Rhythm irregularly irregular.     Chest Wall: PMI is not displaced. No thrill.     Pulses: Normal pulses.          Radial pulses are 2+ on the right side and 2+ on the left side.     Heart sounds: Normal heart sounds. No murmur. No friction rub. No gallop.      Comments: (LE edema improved from one month ago)  Pulmonary:     Effort: Pulmonary effort is normal. No tachypnea, accessory muscle usage or respiratory distress.     Breath sounds: No stridor. Examination of the right-lower field reveals decreased breath sounds. Decreased breath sounds present. No wheezing, rhonchi or rales.     Comments: Dull to percussion ro right low to mid lung fields, no wheeze, rales or rhonchi Abdominal:     General: Bowel sounds are normal. There is no distension.     Palpations: Abdomen is soft.     Tenderness: There is no abdominal tenderness. There is no guarding or rebound.  Musculoskeletal:        General: Normal range of motion.     Cervical back: Normal range of motion and neck supple.     Right lower leg: 1+ Edema present.     Left lower leg: 1+ Edema present.  Skin:    General: Skin is dry.     Capillary Refill: Capillary refill takes less than 2 seconds.     Coloration: Skin is not jaundiced or pale.     Findings: No rash.     Nails: There is no clubbing.     Comments: Bilateral hands are cool to the touch but normal capillary refill brisk less than 2 seconds, normal radial pulses 2+ bilaterally  Neurological:     Mental Status: He is alert.     Cranial Nerves: No dysarthria or facial asymmetry.     Motor: No tremor  or abnormal muscle tone.     Gait: Gait normal.  Psychiatric:        Mood and Affect: Mood normal.        Speech: Speech normal.        Behavior: Behavior normal. Behavior is cooperative.       PHQ2/9: Depression screen [facility]  2/9 01/28/2020 01/07/2020  Decreased Interest 0 0  Down, Depressed, Hopeless 0 0  PHQ - 2 Score 0 0  Altered sleeping 0 0  Tired, decreased energy 0 0  Change in appetite 0 0  Feeling bad or failure about yourself  0 0  Trouble concentrating 0 0  Moving slowly or fidgety/restless 0 0  Suicidal thoughts 0 0  PHQ-9 Score 0 0  Difficult doing work/chores Not difficult at all Not difficult at all    phq 9 is neg, reviewed today  Fall Risk: Fall Risk  01/28/2020 01/07/2020 12/27/2019 11/19/2019  Falls in the past year? 0 0 1 0  Number falls in past yr: 0 0 1 -  Injury with Fall? 0 0 1 -    Functional Status Survey: Is the patient deaf or have difficulty hearing?: No Does the patient have difficulty seeing, even when wearing glasses/contacts?: No Does the patient have difficulty concentrating, remembering, or making decisions?: No Does the patient have difficulty walking or climbing stairs?: No Does the patient have difficulty dressing or bathing?: No Does the patient have difficulty doing errands alone such as visiting a doctor's office or shopping?: No   Assessment & Plan:     ICD-10-CM   1. Pleural effusion on right  J90 DG Chest 2 View    Ambulatory referral to Cardiology    BASIC METABOLIC PANEL WITH GFR   evident continued or enlarged right sided effusion on exam, recheck renal function, restart lasix, CXR  - thoracentesis   2. Persistent atrial fibrillation (HCC)  I48.19 COMPLETE METABOLIC PANEL WITH GFR    Ambulatory referral to Cardiology   referral to cardiology within [facility] - pt prefers, he and I have had difficulty getting ahold of last cardiologist  3. Essential hypertension  I10 COMPLETE METABOLIC PANEL WITH GFR    Ambulatory referral to Cardiology    BASIC METABOLIC PANEL WITH GFR   Unfortunately not at goal today, elevated, continue lisinopril, metoprolol, add back lasix, monitor - could add norvasc?   4. DOE (dyspnea on exertion)  R06.00 Ambulatory  referral to Cardiology   suspect some heart failure with many years of dyspnea on exertion, pleural effusion, poorly controlled hypertension patient needs echo/cardiology f/up  5. Iron deficiency anemia due to chronic blood loss  D50.0 CBC with Differential/Platelet   hx of, recheck labs  6. Acute gastritis without hemorrhage, unspecified gastritis type  K29.00    no GI sx, still seeing surgery for hernia   7. Encounter for medication monitoring  Z51.81 COMPLETE METABOLIC PANEL WITH GFR    Microalbumin, urine    DG Chest 2 View    CBC with Differential/Platelet    CBC with Differential/Platelet    BASIC METABOLIC PANEL WITH GFR     Return for 3 months routine f/up   (needs to come back in 1 month if not into cardiology).   Danelle Berry, PA-C 01/28/20 3:03 PM

## 2020-01-29 ENCOUNTER — Other Ambulatory Visit: Payer: Self-pay | Admitting: Family Medicine

## 2020-01-29 DIAGNOSIS — I4819 Other persistent atrial fibrillation: Secondary | ICD-10-CM

## 2020-01-29 DIAGNOSIS — J90 Pleural effusion, not elsewhere classified: Secondary | ICD-10-CM

## 2020-01-29 DIAGNOSIS — I10 Essential (primary) hypertension: Secondary | ICD-10-CM

## 2020-01-29 LAB — MICROALBUMIN, URINE: Microalb, Ur: 0.2 mg/dL

## 2020-01-29 LAB — CBC WITH DIFFERENTIAL/PLATELET
Absolute Monocytes: 732 cells/uL (ref 200–950)
Basophils Absolute: 31 cells/uL (ref 0–200)
Basophils Relative: 0.5 %
Eosinophils Absolute: 262 cells/uL (ref 15–500)
Eosinophils Relative: 4.3 %
HCT: 46.8 % (ref 38.5–50.0)
Hemoglobin: 15.8 g/dL (ref 13.2–17.1)
Lymphs Abs: 1147 cells/uL (ref 850–3900)
MCH: 30.2 pg (ref 27.0–33.0)
MCHC: 33.8 g/dL (ref 32.0–36.0)
MCV: 89.3 fL (ref 80.0–100.0)
MPV: 10.3 fL (ref 7.5–12.5)
Monocytes Relative: 12 %
Neutro Abs: 3928 cells/uL (ref 1500–7800)
Neutrophils Relative %: 64.4 %
Platelets: 260 10*3/uL (ref 140–400)
RBC: 5.24 10*6/uL (ref 4.20–5.80)
RDW: 12.8 % (ref 11.0–15.0)
Total Lymphocyte: 18.8 %
WBC: 6.1 10*3/uL (ref 3.8–10.8)

## 2020-01-29 LAB — COMPLETE METABOLIC PANEL WITH GFR
AG Ratio: 1.4 (calc) (ref 1.0–2.5)
ALT: 17 U/L (ref 9–46)
AST: 16 U/L (ref 10–35)
Albumin: 4.2 g/dL (ref 3.6–5.1)
Alkaline phosphatase (APISO): 41 U/L (ref 35–144)
BUN: 15 mg/dL (ref 7–25)
CO2: 26 mmol/L (ref 20–32)
Calcium: 9.5 mg/dL (ref 8.6–10.3)
Chloride: 98 mmol/L (ref 98–110)
Creat: 0.96 mg/dL (ref 0.70–1.33)
GFR, Est African American: 106 mL/min/{1.73_m2} (ref >=60)
GFR, Est Non African American: 91 mL/min/{1.73_m2} (ref >=60)
Globulin: 2.9 g/dL (calc) (ref 1.9–3.7)
Glucose, Bld: 81 mg/dL (ref 65–99)
Potassium: 5.2 mmol/L (ref 3.5–5.3)
Sodium: 133 mmol/L — ABNORMAL LOW (ref 135–146)
Total Bilirubin: 1.3 mg/dL — ABNORMAL HIGH (ref 0.2–1.2)
Total Protein: 7.1 g/dL (ref 6.1–8.1)

## 2020-01-29 MED ORDER — POTASSIUM CHLORIDE CRYS ER 20 MEQ PO TBCR: 20.0000 meq | EXTENDED_RELEASE_TABLET | Freq: Every day | ORAL | 3 refills | Status: DC

## 2020-01-29 MED ORDER — FUROSEMIDE 20 MG PO TABS: 10.0000 mg | ORAL_TABLET | Freq: Every day | ORAL | 3 refills | Status: DC

## 2020-02-01 ENCOUNTER — Encounter: Payer: Self-pay | Admitting: Cardiology

## 2020-02-01 ENCOUNTER — Ambulatory Visit (INDEPENDENT_AMBULATORY_CARE_PROVIDER_SITE_OTHER): Payer: Medicaid Other | Admitting: Cardiology

## 2020-02-01 ENCOUNTER — Other Ambulatory Visit: Payer: Self-pay

## 2020-02-01 VITALS — BP 142/102 | HR 85 | Ht 68.0 in | Wt 215.0 lb

## 2020-02-01 DIAGNOSIS — J90 Pleural effusion, not elsewhere classified: Secondary | ICD-10-CM | POA: Diagnosis not present

## 2020-02-01 DIAGNOSIS — I4819 Other persistent atrial fibrillation: Secondary | ICD-10-CM

## 2020-02-01 DIAGNOSIS — R0600 Dyspnea, unspecified: Secondary | ICD-10-CM | POA: Diagnosis not present

## 2020-02-01 DIAGNOSIS — R0609 Other forms of dyspnea: Secondary | ICD-10-CM

## 2020-02-01 NOTE — Patient Instructions (Addendum)
Medication Instructions:  Your physician recommends that you continue on your current medications as directed. Please refer to the Current Medication list given to you today.  *If you need a refill on your cardiac medications before your next appointment, please call your pharmacy*   Lab Work: none If you have labs (blood work) drawn today and your tests are completely normal, you will receive your results only by: Marland Kitchen MyChart Message (if you have MyChart) OR . A paper copy in the mail If you have any lab test that is abnormal or we need to change your treatment, we will call you to review the results.   Testing/Procedures: 1- ECHOCARDIOGRAM - Your physician has requested that you have an echocardiogram. Echocardiography is a painless test that uses sound waves to create images of your heart. It provides your doctor with information about the size and shape of your heart and how well your heart's chambers and valves are working. This procedure takes approximately one hour. There are no restrictions for this procedure. You may get an IV, if needed, to receive an ultrasound enhancing agent through to better visualize your heart.  2- Lexiscan myoview stress test - Your physician has requested that you have a lexiscan myoview. For further information please visit [email or web address] Please follow instruction sheet, as given.  [facility] MYOVIEW  Your caregiver has ordered a Stress Test with nuclear imaging. The purpose of this test is to evaluate the blood supply to your heart muscle. This procedure is referred to as a "Non-Invasive Stress Test." This is because other than having an IV started in your vein, nothing is inserted or "invades" your body. Cardiac stress tests are done to find areas of poor blood flow to the heart by determining the extent of coronary artery disease (CAD). Some patients exercise on a treadmill, which naturally increases the blood flow to your heart, while others who are  unable  to walk on a treadmill due to physical limitations have a pharmacologic/chemical stress agent called Lexiscan . This medicine will mimic walking on a treadmill by temporarily increasing your coronary blood flow.   Please note: these test may take anywhere between 2-4 hours to complete  PLEASE REPORT TO [facility] MEDICAL MALL ENTRANCE  THE VOLUNTEERS AT THE FIRST DESK WILL DIRECT YOU WHERE TO GO  Date of Procedure:_____________________________________  Arrival Time for Procedure:______________________________  Instructions regarding medication:   _xx_:  Hold other medications as follows:_________furosemide_______  PLEASE NOTIFY THE OFFICE AT LEAST 24 HOURS IN ADVANCE IF YOU ARE UNABLE TO KEEP YOUR APPOINTMENT.  [phone] AND  PLEASE NOTIFY NUCLEAR MEDICINE AT [facility] AT LEAST 24 HOURS IN ADVANCE IF YOU ARE UNABLE TO KEEP YOUR APPOINTMENT. [phone]  How to prepare for your Myoview test:  1. Do not eat or drink after midnight 2. No caffeine for 24 hours prior to test 3. No smoking 24 hours prior to test. 4. Your medication may be taken with water.  If your doctor stopped a medication because of this test, do not take that medication. 5. Please wear a short sleeve shirt. 6. No perfume, cologne or lotion. 7. Wear comfortable walking shoes.   Follow-Up: You have been referred to Pulmonology for evaluation of dyspnea on exertion. Please call [phone] to schedule.    At [facility], you and your health needs are our priority.  As part of our continuing mission to provide you with exceptional heart care, we have created designated Provider Care Teams.  These Care Teams include your primary Cardiologist (  physician) and Advanced Practice Providers (APPs -  Physician Assistants and Nurse Practitioners) who all work together to provide you with the care you need, when you need it.  We recommend signing up for the patient portal called "MyChart".  Sign up information is provided on this  After Visit Summary.  MyChart is used to connect with patients for Virtual Visits (Telemedicine).  Patients are able to view lab/test results, encounter notes, upcoming appointments, etc.  Non-urgent messages can be sent to your provider as well.   To learn more about what you can do with MyChart, go to [email or web address].    Your next appointment:   After testing completed.  The format for your next appointment:   In Person  Provider:   Debbe Odea, MD   Cardiac Nuclear Scan A cardiac nuclear scan is a test that measures blood flow to the heart when a person is resting and when he or she is exercising. The test looks for problems such as:  Not enough blood reaching a portion of the heart.  The heart muscle not working normally. You may need this test if:  You have heart disease.  You have had abnormal lab results.  You have had heart surgery or a balloon procedure to open up blocked arteries (angioplasty).  You have chest pain.  You have shortness of breath. In this test, a radioactive dye (tracer) is injected into your bloodstream. After the tracer has traveled to your heart, an imaging device is used to measure how much of the tracer is absorbed by or distributed to various areas of your heart. This procedure is usually done at a hospital and takes 2-4 hours. Tell a health care provider about:  Any allergies you have.  All medicines you are taking, including vitamins, herbs, eye drops, creams, and over-the-counter medicines.  Any problems you or family members have had with anesthetic medicines.  Any blood disorders you have.  Any surgeries you have had.  Any medical conditions you have.  Whether you are pregnant or may be pregnant. What are the risks? Generally, this is a safe procedure. However, problems may occur, including:  Serious chest pain and heart attack. This is only a risk if the stress portion of the test is done.  Rapid heartbeat.   Sensation of warmth in your chest. This usually passes quickly.  Allergic reaction to the tracer. What happens before the procedure?  Ask your health care provider about changing or stopping your regular medicines. This is especially important if you are taking diabetes medicines or blood thinners.  Follow instructions from your health care provider about eating or drinking restrictions.  Remove your jewelry on the day of the procedure. What happens during the procedure?  An IV will be inserted into one of your veins.  Your health care provider will inject a small amount of radioactive tracer through the IV.  You will wait for 20-40 minutes while the tracer travels through your bloodstream.  Your heart activity will be monitored with an electrocardiogram (ECG).  You will lie down on an exam table.  Images of your heart will be taken for about 15-20 minutes.  You may also have a stress test. For this test, one of the following may be done: ? You will exercise on a treadmill or stationary bike. While you exercise, your heart's activity will be monitored with an ECG, and your blood pressure will be checked. ? You will be given medicines that will increase blood flow to  parts of your heart. This is done if you are unable to exercise.  When blood flow to your heart has peaked, a tracer will again be injected through the IV.  After 20-40 minutes, you will get back on the exam table and have more images taken of your heart.  Depending on the type of tracer used, scans may need to be repeated 3-4 hours later.  Your IV line will be removed when the procedure is over. The procedure may vary among health care providers and hospitals. What happens after the procedure?  Unless your health care provider tells you otherwise, you may return to your normal schedule, including diet, activities, and medicines.  Unless your health care provider tells you otherwise, you may increase your fluid  intake. This will help to flush the contrast dye from your body. Drink enough fluid to keep your urine pale yellow.  Ask your health care provider, or the department that is doing the test: ? When will my results be ready? ? How will I get my results? Summary  A cardiac nuclear scan measures the blood flow to the heart when a person is resting and when he or she is exercising.  Tell your health care provider if you are pregnant.  Before the procedure, ask your health care provider about changing or stopping your regular medicines. This is especially important if you are taking diabetes medicines or blood thinners.  After the procedure, unless your health care provider tells you otherwise, increase your fluid intake. This will help flush the contrast dye from your body.  After the procedure, unless your health care provider tells you otherwise, you may return to your normal schedule, including diet, activities, and medicines. This information is not intended to replace advice given to you by your health care provider. Make sure you discuss any questions you have with your health care provider. Document Revised: 05/08/2018 Document Reviewed: 05/08/2018 Elsevier Patient Education  2020 [employer].    Echocardiogram An echocardiogram is a procedure that uses painless sound waves (ultrasound) to produce an image of the heart. Images from an echocardiogram can provide important information about:  Signs of coronary artery disease (CAD).  Aneurysm detection. An aneurysm is a weak or damaged part of an artery wall that bulges out from the normal force of blood pumping through the body.  Heart size and shape. Changes in the size or shape of the heart can be associated with certain conditions, including heart failure, aneurysm, and CAD.  Heart muscle function.  Heart valve function.  Signs of a past heart attack.  Fluid buildup around the heart.  Thickening of the heart muscle.  A  tumor or infectious growth around the heart valves. Tell a health care provider about:  Any allergies you have.  All medicines you are taking, including vitamins, herbs, eye drops, creams, and over-the-counter medicines.  Any blood disorders you have.  Any surgeries you have had.  Any medical conditions you have.  Whether you are pregnant or may be pregnant. What are the risks? Generally, this is a safe procedure. However, problems may occur, including:  Allergic reaction to dye (contrast) that may be used during the procedure. What happens before the procedure? No specific preparation is needed. You may eat and drink normally. What happens during the procedure?   An IV tube may be inserted into one of your veins.  You may receive contrast through this tube. A contrast is an injection that improves the quality of the  pictures from your heart.  A gel will be applied to your chest.  A wand-like tool (transducer) will be moved over your chest. The gel will help to transmit the sound waves from the transducer.  The sound waves will harmlessly bounce off of your heart to allow the heart images to be captured in real-time motion. The images will be recorded on a computer. The procedure may vary among health care providers and hospitals. What happens after the procedure?  You may return to your normal, everyday life, including diet, activities, and medicines, unless your health care provider tells you not to do that. Summary  An echocardiogram is a procedure that uses painless sound waves (ultrasound) to produce an image of the heart.  Images from an echocardiogram can provide important information about the size and shape of your heart, heart muscle function, heart valve function, and fluid buildup around your heart.  You do not need to do anything to prepare before this procedure. You may eat and drink normally.  After the echocardiogram is completed, you may return to your  normal, everyday life, unless your health care provider tells you not to do that. This information is not intended to replace advice given to you by your health care provider. Make sure you discuss any questions you have with your health care provider. Document Revised: 03/15/2019 Document Reviewed: 12/25/2016 Elsevier Patient Education  [place].

## 2020-02-01 NOTE — Progress Notes (Signed)
Cardiology Office Note:    Date:  02/01/2020   ID:  Dean Anderson, DOB Apr 21, 1966, MRN [id number]  PCP:  Dean Berry, PA-C  Cardiologist:  Dean Odea, MD  Electrophysiologist:  None   Referring MD: Dean Berry, PA-C   Chief Complaint  Patient presents with  . New Patient (Initial Visit)    Referred by PCP for SOB, and HTN. Meds reviewed verbally with patient.     History of Present Illness:    Dean Anderson is a 54 y.o. male with a hx of paroxysmal A. fib, hypertension who presents due to shortness of breath.  Patient is here to establish care due to history of atrial fibrillation.  He was diagnosed with atrial fibrillation about a year and a half ago.  He was previously put on blood thinners but subsequently developed an anemia.  GI work-up has since been unrevealing.  He states noticing blood in his stool sometimes.  Denies history of smoking, CHF.  Endorses dyspnea on exertion.  Has abdominal hernia and surgery is being considered.CT obtained last month on 12/2019 for shortness of breath was negative for PE but showed a large right pleural effusion and trace left pleural effusion.  He was placed on Lasix which she currently takes.  Past Medical History:  Diagnosis Date  . Anemia   . Atrial fibrillation (HCC)   . GERD (gastroesophageal reflux disease)   . Hypertension   . Thyroid disease     Past Surgical History:  Procedure Laterality Date  . COLONOSCOPY WITH PROPOFOL N/A 04/17/2019   Procedure: COLONOSCOPY WITH PROPOFOL;  Surgeon: Dean Minium, MD;  Location: [facility] ENDOSCOPY;  Service: Endoscopy;  Laterality: N/A;  . ESOPHAGOGASTRODUODENOSCOPY (EGD) WITH PROPOFOL N/A 04/17/2019   Procedure: ESOPHAGOGASTRODUODENOSCOPY (EGD) WITH PROPOFOL;  Surgeon: Dean Minium, MD;  Location: [facility] ENDOSCOPY;  Service: Endoscopy;  Laterality: N/A;  . GIVENS CAPSULE STUDY  05/2019   negative    Current Medications: Current Meds  Medication Sig  . albuterol (VENTOLIN HFA) 108 (90  Base) MCG/ACT inhaler Inhale 2 puffs into the lungs every 6 (six) hours as needed for wheezing or shortness of breath.  . fluticasone (FLONASE) 50 MCG/ACT nasal spray Place 2 sprays into both nostrils as needed.   . furosemide (LASIX) 20 MG tablet Take 0.5 tablets (10 mg total) by mouth daily. Take with lasix/furosemide  . levothyroxine (SYNTHROID) 88 MCG tablet Take 1 tablet (88 mcg total) by mouth daily before breakfast.  . lisinopril (ZESTRIL) 20 MG tablet Take 1 tablet (20 mg total) by mouth daily.  . metoprolol tartrate (LOPRESSOR) 25 MG tablet TAKE 3 TABLETS BY MOUTH TWICE DAILY FOR HIGH BLOOD PRESSURE  . omeprazole (PRILOSEC) 20 MG capsule Take 1 capsule (20 mg total) by mouth at bedtime.  . potassium chloride SA (KLOR-CON) 20 MEQ tablet Take 1 tablet (20 mEq total) by mouth daily.     Allergies:   Patient has no known allergies.   Social History   Socioeconomic History  . Marital status: Married    Spouse name: Dean Anderson  . Number of children: Not on file  . Years of education: 53  . Highest education level: Some college, no degree  Occupational History  . Not on file  Tobacco Use  . Smoking status: Never Smoker  . Smokeless tobacco: Never Used  Substance and Sexual Activity  . Alcohol use: Yes    Alcohol/week: 70.0 standard drinks    Types: 70 Cans of beer per week    Comment: 6 beers a  day  . Drug use: Not Currently    Comment: "rare-once a year"   . Sexual activity: Yes  Other Topics Concern  . Not on file  Social History Narrative   alcohol use.  Lives with his wife at home.   No lung dx hx, grew up with second hand smoke, wife smokes out of home   Social Determinants of Health   Financial Resource Strain:   . Difficulty of Paying Living Expenses: Not on file  Food Insecurity:   . Worried About Programme researcher, broadcasting/film/video in the Last Year: Not on file  . Ran Out of Food in the Last Year: Not on file  Transportation Needs:   . Lack of Transportation (Medical): Not  on file  . Lack of Transportation (Non-Medical): Not on file  Physical Activity:   . Days of Exercise per Week: Not on file  . Minutes of Exercise per Session: Not on file  Stress:   . Feeling of Stress : Not on file  Social Connections:   . Frequency of Communication with Friends and Family: Not on file  . Frequency of Social Gatherings with Friends and Family: Not on file  . Attends Religious Services: Not on file  . Active Member of Clubs or Organizations: Not on file  . Attends Banker Meetings: Not on file  . Marital Status: Not on file     Family History: The patient's family history includes Heart disease in his brother; Hypertension in his brother and mother; Liver cancer in his maternal uncle; Stroke (age of onset: 66) in his father; Thyroid disease in his mother.  ROS:   Please see the history of present illness.     All other systems reviewed and are negative.  EKGs/Labs/Other Studies Reviewed:    The following studies were reviewed today:   EKG:  EKG is  ordered today.  The ekg ordered today demonstrates atrial fibrillation, heart rate 85  Recent Labs: 12/27/2019: TSH 4.54 01/07/2020: B Natriuretic Peptide 156.0 01/28/2020: ALT 17; BUN 15; Creat 0.96; Hemoglobin 15.8; Platelets 260; Potassium 5.2; Sodium 133  Recent Lipid Panel    Component Value Date/Time   CHOL 181 12/27/2019 1215   TRIG 79 12/27/2019 1215   HDL 52 12/27/2019 1215   CHOLHDL 3.5 12/27/2019 1215   LDLCALC 112 (H) 12/27/2019 1215    Physical Exam:    VS:  BP (!) 142/102 (BP Location: Right Arm, Patient Position: Sitting, Cuff Size: Normal)   Pulse 85   Ht 5\' 8"  (1.727 m)   Wt 215 lb (97.5 kg)   SpO2 96%   BMI 32.69 kg/m     Wt Readings from Last 3 Encounters:  02/01/20 215 lb (97.5 kg)  01/28/20 211 lb 1.6 oz (95.8 kg)  01/07/20 198 lb (89.8 kg)     GEN:  Well nourished, well developed in no acute distress HEENT: Normal NECK: No JVD; No carotid bruits LYMPHATICS: No  lymphadenopathy CARDIAC: Irregular irregular, no murmurs, rubs, gallops RESPIRATORY: Creased breath sounds at the right lung base ABDOMEN: Soft, non-tender, non-distended MUSCULOSKELETAL:  No edema; No deformity  SKIN: Warm and dry NEUROLOGIC:  Alert and oriented x 3 PSYCHIATRIC:  Normal affect   ASSESSMENT:    1. Persistent atrial fibrillation (HCC)   2. DOE (dyspnea on exertion)   3. Pleural effusion    PLAN:    In order of problems listed above:  1. Patient with persistent atrial fibrillation.  CHA2DS2-VASc: 1(htn).  Intolerable to anticoagulants  with severe anemia.  GI work-up is so far been unrevealing.  Continue Lopressor 75 mg twice daily as prescribed.  Get echocardiogram. 2. Patient with dyspnea on exertion.  Has large right pleural effusion.  Will get Lexiscan myocardial stress test to evaluate for ischemia.  Also due to patient planning hiatal hernia surgery. 3. Refer patient to pulmonary medicine due to large pleural effusion.  Continue Lasix as prescribed.  follow up after echo and stress test.  This note was generated in part or whole with voice recognition software. Voice recognition is usually quite accurate but there are transcription errors that can and very often do occur. I apologize for any typographical errors that were not detected and corrected.  Medication Adjustments/Labs and Tests Ordered: Current medicines are reviewed at length with the patient today.  Concerns regarding medicines are outlined above.  Orders Placed This Encounter  Procedures  . NM Myocar Multi W/Spect W/Wall Motion / EF  . Ambulatory referral to Pulmonology  . EKG 12-Lead  . ECHOCARDIOGRAM COMPLETE   No orders of the defined types were placed in this encounter.   Patient Instructions  Medication Instructions:  Your physician recommends that you continue on your current medications as directed. Please refer to the Current Medication list given to you today.  *If you need a refill  on your cardiac medications before your next appointment, please call your pharmacy*   Lab Work: none If you have labs (blood work) drawn today and your tests are completely normal, you will receive your results only by: Marland Kitchen MyChart Message (if you have MyChart) OR . A paper copy in the mail If you have any lab test that is abnormal or we need to change your treatment, we will call you to review the results.   Testing/Procedures: 1- ECHOCARDIOGRAM - Your physician has requested that you have an echocardiogram. Echocardiography is a painless test that uses sound waves to create images of your heart. It provides your doctor with information about the size and shape of your heart and how well your heart's chambers and valves are working. This procedure takes approximately one hour. There are no restrictions for this procedure. You may get an IV, if needed, to receive an ultrasound enhancing agent through to better visualize your heart.  2- Lexiscan myoview stress test - Your physician has requested that you have a lexiscan myoview. For further information please visit [email or web address] Please follow instruction sheet, as given.  [facility] MYOVIEW  Your caregiver has ordered a Stress Test with nuclear imaging. The purpose of this test is to evaluate the blood supply to your heart muscle. This procedure is referred to as a "Non-Invasive Stress Test." This is because other than having an IV started in your vein, nothing is inserted or "invades" your body. Cardiac stress tests are done to find areas of poor blood flow to the heart by determining the extent of coronary artery disease (CAD). Some patients exercise on a treadmill, which naturally increases the blood flow to your heart, while others who are  unable to walk on a treadmill due to physical limitations have a pharmacologic/chemical stress agent called Lexiscan . This medicine will mimic walking on a treadmill by temporarily increasing your coronary  blood flow.   Please note: these test may take anywhere between 2-4 hours to complete  PLEASE REPORT TO [facility] MEDICAL MALL ENTRANCE  THE VOLUNTEERS AT THE FIRST DESK WILL DIRECT YOU WHERE TO GO  Date of Procedure:_____________________________________  Arrival Time for Procedure:______________________________  Instructions regarding medication:   _xx_:  Hold other medications as follows:_________furosemide_______  PLEASE NOTIFY THE OFFICE AT LEAST 24 HOURS IN ADVANCE IF YOU ARE UNABLE TO KEEP YOUR APPOINTMENT.  [phone] AND  PLEASE NOTIFY NUCLEAR MEDICINE AT [facility] AT LEAST 24 HOURS IN ADVANCE IF YOU ARE UNABLE TO KEEP YOUR APPOINTMENT. [phone]  How to prepare for your Myoview test:  1. Do not eat or drink after midnight 2. No caffeine for 24 hours prior to test 3. No smoking 24 hours prior to test. 4. Your medication may be taken with water.  If your doctor stopped a medication because of this test, do not take that medication. 5. Please wear a short sleeve shirt. 6. No perfume, cologne or lotion. 7. Wear comfortable walking shoes.   Follow-Up: You have been referred to Pulmonology for evaluation of dyspnea on exertion. Please call [phone] to schedule.    At [facility], you and your health needs are our priority.  As part of our continuing mission to provide you with exceptional heart care, we have created designated Provider Care Teams.  These Care Teams include your primary Cardiologist (physician) and Advanced Practice Providers (APPs -  Physician Assistants and Nurse Practitioners) who all work together to provide you with the care you need, when you need it.  We recommend signing up for the patient portal called "MyChart".  Sign up information is provided on this After Visit Summary.  MyChart is used to connect with patients for Virtual Visits (Telemedicine).  Patients are able to view lab/test results, encounter notes, upcoming appointments, etc.  Non-urgent  messages can be sent to your provider as well.   To learn more about what you can do with MyChart, go to [email or web address].    Your next appointment:   After testing completed.  The format for your next appointment:   In Person  Provider:   Debbe OdeaBrian Agbor-Etang, MD   Cardiac Nuclear Scan A cardiac nuclear scan is a test that measures blood flow to the heart when a person is resting and when he or she is exercising. The test looks for problems such as:  Not enough blood reaching a portion of the heart.  The heart muscle not working normally. You may need this test if:  You have heart disease.  You have had abnormal lab results.  You have had heart surgery or a balloon procedure to open up blocked arteries (angioplasty).  You have chest pain.  You have shortness of breath. In this test, a radioactive dye (tracer) is injected into your bloodstream. After the tracer has traveled to your heart, an imaging device is used to measure how much of the tracer is absorbed by or distributed to various areas of your heart. This procedure is usually done at a hospital and takes 2-4 hours. Tell a health care provider about:  Any allergies you have.  All medicines you are taking, including vitamins, herbs, eye drops, creams, and over-the-counter medicines.  Any problems you or family members have had with anesthetic medicines.  Any blood disorders you have.  Any surgeries you have had.  Any medical conditions you have.  Whether you are pregnant or may be pregnant. What are the risks? Generally, this is a safe procedure. However, problems may occur, including:  Serious chest pain and heart attack. This is only a risk if the stress portion of the test is done.  Rapid heartbeat.  Sensation of warmth in your chest. This usually passes quickly.  Allergic reaction  to the tracer. What happens before the procedure?  Ask your health care provider about changing or stopping your  regular medicines. This is especially important if you are taking diabetes medicines or blood thinners.  Follow instructions from your health care provider about eating or drinking restrictions.  Remove your jewelry on the day of the procedure. What happens during the procedure?  An IV will be inserted into one of your veins.  Your health care provider will inject a small amount of radioactive tracer through the IV.  You will wait for 20-40 minutes while the tracer travels through your bloodstream.  Your heart activity will be monitored with an electrocardiogram (ECG).  You will lie down on an exam table.  Images of your heart will be taken for about 15-20 minutes.  You may also have a stress test. For this test, one of the following may be done: ? You will exercise on a treadmill or stationary bike. While you exercise, your heart's activity will be monitored with an ECG, and your blood pressure will be checked. ? You will be given medicines that will increase blood flow to parts of your heart. This is done if you are unable to exercise.  When blood flow to your heart has peaked, a tracer will again be injected through the IV.  After 20-40 minutes, you will get back on the exam table and have more images taken of your heart.  Depending on the type of tracer used, scans may need to be repeated 3-4 hours later.  Your IV line will be removed when the procedure is over. The procedure may vary among health care providers and hospitals. What happens after the procedure?  Unless your health care provider tells you otherwise, you may return to your normal schedule, including diet, activities, and medicines.  Unless your health care provider tells you otherwise, you may increase your fluid intake. This will help to flush the contrast dye from your body. Drink enough fluid to keep your urine pale yellow.  Ask your health care provider, or the department that is doing the test: ? When will  my results be ready? ? How will I get my results? Summary  A cardiac nuclear scan measures the blood flow to the heart when a person is resting and when he or she is exercising.  Tell your health care provider if you are pregnant.  Before the procedure, ask your health care provider about changing or stopping your regular medicines. This is especially important if you are taking diabetes medicines or blood thinners.  After the procedure, unless your health care provider tells you otherwise, increase your fluid intake. This will help flush the contrast dye from your body.  After the procedure, unless your health care provider tells you otherwise, you may return to your normal schedule, including diet, activities, and medicines. This information is not intended to replace advice given to you by your health care provider. Make sure you discuss any questions you have with your health care provider. Document Revised: 05/08/2018 Document Reviewed: 05/08/2018 Elsevier Patient Education  2020 [employer].    Echocardiogram An echocardiogram is a procedure that uses painless sound waves (ultrasound) to produce an image of the heart. Images from an echocardiogram can provide important information about:  Signs of coronary artery disease (CAD).  Aneurysm detection. An aneurysm is a weak or damaged part of an artery wall that bulges out from the normal force of blood pumping through the body.  Heart size  and shape. Changes in the size or shape of the heart can be associated with certain conditions, including heart failure, aneurysm, and CAD.  Heart muscle function.  Heart valve function.  Signs of a past heart attack.  Fluid buildup around the heart.  Thickening of the heart muscle.  A tumor or infectious growth around the heart valves. Tell a health care provider about:  Any allergies you have.  All medicines you are taking, including vitamins, herbs, eye drops, creams, and  over-the-counter medicines.  Any blood disorders you have.  Any surgeries you have had.  Any medical conditions you have.  Whether you are pregnant or may be pregnant. What are the risks? Generally, this is a safe procedure. However, problems may occur, including:  Allergic reaction to dye (contrast) that may be used during the procedure. What happens before the procedure? No specific preparation is needed. You may eat and drink normally. What happens during the procedure?   An IV tube may be inserted into one of your veins.  You may receive contrast through this tube. A contrast is an injection that improves the quality of the pictures from your heart.  A gel will be applied to your chest.  A wand-like tool (transducer) will be moved over your chest. The gel will help to transmit the sound waves from the transducer.  The sound waves will harmlessly bounce off of your heart to allow the heart images to be captured in real-time motion. The images will be recorded on a computer. The procedure may vary among health care providers and hospitals. What happens after the procedure?  You may return to your normal, everyday life, including diet, activities, and medicines, unless your health care provider tells you not to do that. Summary  An echocardiogram is a procedure that uses painless sound waves (ultrasound) to produce an image of the heart.  Images from an echocardiogram can provide important information about the size and shape of your heart, heart muscle function, heart valve function, and fluid buildup around your heart.  You do not need to do anything to prepare before this procedure. You may eat and drink normally.  After the echocardiogram is completed, you may return to your normal, everyday life, unless your health care provider tells you not to do that. This information is not intended to replace advice given to you by your health care provider. Make sure you discuss  any questions you have with your health care provider. Document Revised: 03/15/2019 Document Reviewed: 12/25/2016 Elsevier Patient Education  2020 Antimony, Kate Sable, MD  02/01/2020 5:26 PM    [place]

## 2020-02-13 ENCOUNTER — Other Ambulatory Visit: Payer: Self-pay | Admitting: Family Medicine

## 2020-02-13 ENCOUNTER — Ambulatory Visit (INDEPENDENT_AMBULATORY_CARE_PROVIDER_SITE_OTHER): Payer: Medicaid Other | Admitting: Surgery

## 2020-02-13 ENCOUNTER — Encounter: Payer: Self-pay | Admitting: Surgery

## 2020-02-13 ENCOUNTER — Other Ambulatory Visit: Payer: Self-pay

## 2020-02-13 VITALS — BP 136/88 | HR 91 | Temp 96.6°F | Resp 14 | Ht 68.0 in | Wt 217.8 lb

## 2020-02-13 DIAGNOSIS — I4819 Other persistent atrial fibrillation: Secondary | ICD-10-CM

## 2020-02-13 DIAGNOSIS — K449 Diaphragmatic hernia without obstruction or gangrene: Secondary | ICD-10-CM | POA: Diagnosis not present

## 2020-02-13 MED ORDER — METOPROLOL TARTRATE 25 MG PO TABS: 75.0000 mg | ORAL_TABLET | Freq: Two times a day (BID) | ORAL | 5 refills | Status: DC

## 2020-02-13 NOTE — Patient Instructions (Signed)
We will see you in 2 months for a follow up. See you appointments below.  Hernia, Adult     A hernia happens when tissue inside your body pushes out through a weak spot in your belly muscles (abdominal wall). This makes a round lump (bulge). The lump may be:  In a scar from surgery that was done in your belly (incisional hernia).  Near your belly button (umbilical hernia).  In your groin (inguinal hernia). Your groin is the area where your leg meets your lower belly (abdomen). This kind of hernia could also be: ? In your scrotum, if you are male. ? In folds of skin around your vagina, if you are male.  In your upper thigh (femoral hernia).  Inside your belly (hiatal hernia). This happens when your stomach slides above the muscle between your belly and your chest (diaphragm). If your hernia is small and it does not cause pain, you may not need treatment. If your hernia is large or it causes pain, you may need surgery. Follow these instructions at home: Activity  Avoid stretching or overusing (straining) the muscles near your hernia. Straining can happen when you: ? Lift something heavy. ? Poop (have a bowel movement).  Do not lift anything that is heavier than 10 lb (4.5 kg), or the limit that you are told, until your doctor says that it is safe.  Use the strength of your legs when you lift something heavy. Do not use only your back muscles to lift. General instructions  Do these things if told by your doctor so you do not have trouble pooping (constipation): ? Drink enough fluid to keep your pee (urine) pale yellow. ? Eat foods that are high in fiber. These include fresh fruits and vegetables, whole grains, and beans. ? Limit foods that are high in fat and processed sugars. These include foods that are fried or sweet. ? Take medicine for trouble pooping.  When you cough, try to cough gently.  You may try to push your hernia in by very gently pressing on it when you are lying  down. Do not try to force the bulge back in if it will not push in easily.  If you are overweight, work with your doctor to lose weight safely.  Do not use any products that have nicotine or tobacco in them. These include cigarettes and e-cigarettes. If you need help quitting, ask your doctor.  If you will be having surgery (hernia repair), watch your hernia for changes in shape, size, or color. Tell your doctor if you see any changes.  Take over-the-counter and prescription medicines only as told by your doctor.  Keep all follow-up visits as told by your doctor. Contact a doctor if:  You get new pain, swelling, or redness near your hernia.  You poop fewer times in a week than normal.  You have trouble pooping.  You have poop (stool) that is more dry than normal.  You have poop that is harder or larger than normal. Get help right away if:  You have a fever.  You have belly pain that gets worse.  You feel sick to your stomach (nauseous).  You throw up (vomit).  Your hernia cannot be pushed in by very gently pressing on it when you are lying down. Do not try to force the bulge back in if it will not push in easily.  Your hernia: ? Changes in shape or size. ? Changes color. ? Feels hard or it hurts when  you touch it. These symptoms may represent a serious problem that is an emergency. Do not wait to see if the symptoms will go away. Get medical help right away. Call your local emergency services (911 in the [place] Summary  A hernia happens when tissue inside your body pushes out through a weak spot in the belly muscles. This creates a bulge.  If your hernia is small and it does not hurt, you may not need treatment. If your hernia is large or it hurts, you may need surgery.  If you will be having surgery, watch your hernia for changes in shape, size, or color. Tell your doctor about any changes. This information is not intended to replace advice given to you by your health  care provider. Make sure you discuss any questions you have with your health care provider. Document Revised: 03/15/2019 Document Reviewed: 08/24/2017 Elsevier Patient Education  2020 [employer].

## 2020-02-13 NOTE — Telephone Encounter (Signed)
Requested medication (s) are due for refill today: yes  Requested medication (s) are on the active medication list: yes  Last refill: 01/07/20  Future visit scheduled: yes  Notes to clinic:  is Cardiology managing this med? See pharmacy note     Requested Prescriptions  Pending Prescriptions Disp Refills   metoprolol tartrate (LOPRESSOR) 25 MG tablet 180 tablet 0    Sig: TAKE 3 TABLETS BY MOUTH TWICE DAILY FOR HIGH BLOOD PRESSURE      Cardiovascular:  Beta Blockers Failed - 02/13/2020 11:44 AM      Failed - Last BP in normal range    BP Readings from Last 1 Encounters:  02/01/20 (!) 142/102          Passed - Last Heart Rate in normal range    Pulse Readings from Last 1 Encounters:  02/01/20 85          Passed - Valid encounter within last 6 months    Recent Outpatient Visits           2 weeks ago Pleural effusion on right   [facility] Danelle Berry, PA-C   1 month ago Orthopnea   [facility] Danelle Berry, PA-C   1 month ago Shortness of breath   [facility] Danelle Berry, [place]       Future Appointments             Today Pabon, Merri Ray, MD [facility]   In 1 month Agbor-Etang, Arlys John, MD [facility]   In 2 months Danelle Berry, PA-C [facility], [facility]

## 2020-02-13 NOTE — Telephone Encounter (Signed)
Medication Refill - Medication: metoprolol tartrate (LOPRESSOR) 25 MG tablet   Pt is out of medication  Has the patient contacted their pharmacy? Yes.   (Agent: If no, request that the patient contact the pharmacy for the refill.) (Agent: If yes, when and what did the pharmacy advise?)  Preferred Pharmacy (with phone number or street name):  [facility] [address] [place]), [place] - [address] Phone:  [phone]  Fax:  [phone]       Agent: Please be advised that RX refills may take up to 3 business days. We ask that you follow-up with your pharmacy.

## 2020-02-13 NOTE — Telephone Encounter (Signed)
Hypertension medication request:  Last office visit pertaining to hypertension:02/01/20  BP Readings from Last 3 Encounters:  02/01/20 (!) 142/102  01/28/20 (!) 142/94  01/07/20 (!) 148/105    Lab Results  Component Value Date   CREATININE 0.96 01/28/2020   BUN 15 01/28/2020   NA 133 (L) 01/28/2020   K 5.2 01/28/2020   CL 98 01/28/2020   CO2 26 01/28/2020     No follow-ups on file.

## 2020-02-14 ENCOUNTER — Other Ambulatory Visit: Payer: Self-pay

## 2020-02-14 ENCOUNTER — Encounter
Admission: RE | Admit: 2020-02-14 | Discharge: 2020-02-14 | Disposition: A | Discharge status: Home / Self Care | Payer: Medicaid Other | Source: Ambulatory Visit | Attending: Cardiology | Admitting: Cardiology

## 2020-02-14 DIAGNOSIS — R0600 Dyspnea, unspecified: Secondary | ICD-10-CM | POA: Insufficient documentation

## 2020-02-14 DIAGNOSIS — I4819 Other persistent atrial fibrillation: Secondary | ICD-10-CM | POA: Diagnosis not present

## 2020-02-14 DIAGNOSIS — R0609 Other forms of dyspnea: Secondary | ICD-10-CM

## 2020-02-14 MED ORDER — TECHNETIUM TC 99M TETROFOSMIN IV KIT
31.6000 | PACK | Freq: Once | INTRAVENOUS | Status: AC | PRN
  Administered 2020-02-14: 31.6 via INTRAVENOUS

## 2020-02-14 MED ORDER — TECHNETIUM TC 99M TETROFOSMIN IV KIT
11.6000 | PACK | Freq: Once | INTRAVENOUS | Status: AC | PRN
  Administered 2020-02-14: 11.6 via INTRAVENOUS

## 2020-02-14 MED ORDER — REGADENOSON 0.4 MG/5ML IV SOLN
0.4000 mg | Freq: Once | INTRAVENOUS | Status: AC
  Administered 2020-02-14: 0.4 mg via INTRAVENOUS

## 2020-02-15 LAB — NM MYOCAR MULTI W/SPECT W/WALL MOTION / EF
Estimated workload: 1 METS
Exercise duration (min): 0 min
Exercise duration (sec): 0 s
LV dias vol: 46 mL (ref 62–150)
LV sys vol: 14 mL
MPHR: 169 {beats}/min
Peak HR: 120 {beats}/min
Percent HR: 71 %
Rest HR: 82 {beats}/min
SDS: 4
SRS: 1
SSS: 5
TID: 0.88

## 2020-02-15 NOTE — Progress Notes (Signed)
Outpatient Surgical Follow Up  02/15/2020  Dean Anderson is an 54 y.o. male.   Chief Complaint  Patient presents with  . 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  He did recently develop COVID-19 and developed worsening of dyspnea on exertion.  He does have a history of A. fib and is being seen by cardiology.  He now has a pending evaluation and work-up by pulmonary medicine.  He did went to the emergency room and I have personally reviewed his CT scan showing evidence of significant disease on the right lung with a significant pleural effusion that has not improved.  The patient has been placed on Lasix with only minimal response.  He does complain of significant dyspnea on exertion. Does have a pending stress test.  Past Medical History:  Diagnosis Date  . Anemia   . Atrial fibrillation (HCC)   . GERD (gastroesophageal reflux disease)   . Hypertension   . Thyroid disease     Past Surgical History:  Procedure Laterality Date  . COLONOSCOPY WITH PROPOFOL N/A 04/17/2019   Procedure: COLONOSCOPY WITH PROPOFOL;  Surgeon: Midge Minium, MD;  Location: [facility] ENDOSCOPY;  Service: Endoscopy;  Laterality: N/A;  . ESOPHAGOGASTRODUODENOSCOPY (EGD) WITH PROPOFOL N/A 04/17/2019   Procedure: ESOPHAGOGASTRODUODENOSCOPY (EGD) WITH PROPOFOL;  Surgeon: Midge Minium, MD;  Location: [facility] ENDOSCOPY;  Service: Endoscopy;  Laterality: N/A;  . GIVENS CAPSULE STUDY  05/2019   negative    Family History  Problem Relation Age of Onset  . Hypertension Mother   . Thyroid disease Mother   . Stroke Father 58  . Liver cancer Maternal Uncle   . Heart disease Brother   . Hypertension Brother     Social History:  reports that he has never smoked. He has never used smokeless tobacco. He reports current alcohol use of about 70.0 standard drinks of alcohol per week. He reports previous drug  use.  Allergies: No Known Allergies  Medications reviewed.    ROS Full ROS performed and is otherwise negative other than what is stated in HPI   BP 136/88   Pulse 91   Temp (!) 96.6 F (35.9 C) (Temporal)   Resp 14   Ht 5\' 8"  (1.727 m)   Wt 217 lb 12.8 oz (98.8 kg)   SpO2 96%   BMI 33.12 kg/m   Physical Exam Vitals and nursing note reviewed. Exam conducted with a chaperone present.  Constitutional:      General: He is not in acute distress.    Appearance: Normal appearance. He is normal weight.  Eyes:     General: No scleral icterus.       Right eye: No discharge.        Left eye: No discharge.  Cardiovascular:     Rate and Rhythm: Normal rate and regular rhythm.  Pulmonary:     Effort: Pulmonary effort is normal. No respiratory distress.     Breath sounds: No stridor. No rhonchi.     Comments: Decrease BS on the right sound c/w large effusion Abdominal:     General: Abdomen is flat. There is no distension.     Palpations: There is no mass.     Tenderness: There is no abdominal tenderness. There is no rebound.     Hernia: No hernia is present.  Musculoskeletal:        General: Swelling present. No tenderness.  Normal range of motion.     Cervical back: Normal range of motion and neck supple. No rigidity.     Right lower leg: Edema present.     Left lower leg: Edema present.  Skin:    General: Skin is warm.     Capillary Refill: Capillary refill takes less than 2 seconds.  Neurological:     General: No focal deficit present.     Mental Status: He is alert.  Psychiatric:        Mood and Affect: Mood normal.        Behavior: Behavior normal.        Thought Content: Thought content normal.        Judgment: Judgment normal.        No results found for this or any previous visit (from the past 48 hour(s)). NM Myocar Multi W/Spect W/Wall Motion / EF  Result Date: 02/15/2020 Pharmacological myocardial perfusion imaging study with no significant  ischemia  Normal wall motion, EF estimated at 73% No EKG changes concerning for ischemia at peak stress or in recovery. CT attenuation correction images with no coronary calcification, no aortic atherosclerosis Large right pleural effusion again noted Low risk scan Signed, Esmond Plants, MD, Ph.D [facility]    Assessment/Plan:  54 year old male with symptomatic paraesophageal hernia but now with persistent pulmonary issues dyspnea on exertion and right pleural effusion.  This obviously will need to be improved before elective repair is attempted.  Please note that there is no need for urgent surgical intervention at this time.  He needs to be optimized from both cardiovascular and pulmonary perspective before undergoing elective procedure.  exTensive counseling provided and he is in agreement with me   Greater than 50% of the 40 minutes  visit was spent in counseling/coordination of care   Caroleen Hamman, MD [place] [facility]

## 2020-02-18 ENCOUNTER — Encounter: Payer: Self-pay | Admitting: Family Medicine

## 2020-02-18 ENCOUNTER — Telehealth (INDEPENDENT_AMBULATORY_CARE_PROVIDER_SITE_OTHER): Payer: Medicaid Other | Admitting: Family Medicine

## 2020-02-18 VITALS — BP 166/117 | HR 77

## 2020-02-18 DIAGNOSIS — R0609 Other forms of dyspnea: Secondary | ICD-10-CM

## 2020-02-18 DIAGNOSIS — J90 Pleural effusion, not elsewhere classified: Secondary | ICD-10-CM

## 2020-02-18 DIAGNOSIS — I10 Essential (primary) hypertension: Secondary | ICD-10-CM | POA: Diagnosis not present

## 2020-02-18 DIAGNOSIS — I4819 Other persistent atrial fibrillation: Secondary | ICD-10-CM

## 2020-02-18 DIAGNOSIS — R0600 Dyspnea, unspecified: Secondary | ICD-10-CM | POA: Diagnosis not present

## 2020-02-18 MED ORDER — FLUTICASONE PROPIONATE 50 MCG/ACT NA SUSP: 2.0000 | NASAL | 5 refills | Status: DC | PRN

## 2020-02-18 MED ORDER — HYDRALAZINE HCL 25 MG PO TABS: 25.0000 mg | ORAL_TABLET | Freq: Three times a day (TID) | ORAL | 2 refills | Status: DC | PRN

## 2020-02-18 MED ORDER — LISINOPRIL 40 MG PO TABS: 40.0000 mg | ORAL_TABLET | Freq: Every day | ORAL | 3 refills | Status: DC

## 2020-02-18 MED ORDER — FUROSEMIDE 20 MG PO TABS: 20.0000 mg | ORAL_TABLET | Freq: Every day | ORAL | 3 refills | Status: DC

## 2020-02-18 NOTE — Progress Notes (Signed)
Name: Dean Anderson   MRN: [id number]    DOB: Mar 15, 1966   Date:02/18/2020       Progress Note  Subjective:    Chief Complaint  Chief Complaint  Patient presents with  . Hypertension    Pt states running high all weekend, had stress test on friday and was running high but they did not do anything about it.  Pt states been running160's/120's    I connected with  [name]  on 02/18/20 at  1:20 PM EDT by a video enabled telemedicine application and verified that I am speaking with the correct person using two identifiers.  I discussed the limitations of evaluation and management by telemedicine and the availability of in person appointments. The patient expressed understanding and agreed to proceed. Staff also discussed with the patient that there may be a patient responsible charge related to this service. Patient Location: home Provider Location: [facility] Additional Individuals present: none  HPI  Yesterday BP very high 160/120, today 166/117.  He did a stress test with cardiology at the hospital and his BP was very high and lexiscan was done - "it took it out of him"  His shortness of breath is gradually worsening again.  He has continued to take lisinopril, metoprolol and Lasix daily, he did see cardiology who did not change any medications, he is scheduled for a echocardiogram.  He states that he did the dobutamine stress test his blood pressure was very high at that time and they were concerned about him and they are thinking about sending him to the ER because of his diastolic blood pressure number, I cannot see any records about this and I cannot see the results of the stress test. Patient also notes that his orthopnea has worsened again he states that it does not feel exactly painful when he lays down and had to go to bed but it feels restricted again in his chest is hard to breathe.  Of note he is also gained weight, now is up about 6-7 pounds from his baseline.  Wt Readings  from Last 5 Encounters:  02/13/20 217 lb 12.8 oz (98.8 kg)  02/01/20 215 lb (97.5 kg)  01/28/20 211 lb 1.6 oz (95.8 kg)  01/07/20 198 lb (89.8 kg)  12/28/19 210 lb (95.3 kg)    He does have an appointment tomorrow with pulmonology.  The order for thoracentesis that we previously discussed is still standing and and I discussed with him that hopefully pulmonology will be able to treat his effusion I suspect that his pleural effusion is worsened.  Concerned that increased pressure in his lungs and heart are also driving the worsening effusion which are in turn worsening his blood pressures and will likely not improve until he get the fluid off with the procedure.     Patient Active Problem List   Diagnosis Date Noted  . Pleural effusion on right 01/11/2020  . Iron deficiency anemia due to chronic blood loss   . Acute gastritis without hemorrhage   . Hematochezia   . Hypertension 10/02/2018  . Shortness of breath 10/02/2018  . Persistent atrial fibrillation (HCC) 09/21/2018    Social History   Tobacco Use  . Smoking status: Never Smoker  . Smokeless tobacco: Never Used  Substance Use Topics  . Alcohol use: Yes    Alcohol/week: 70.0 standard drinks    Types: 70 Cans of beer per week    Comment: 6 beers a day     Current Outpatient  Medications:  .  albuterol (VENTOLIN HFA) 108 (90 Base) MCG/ACT inhaler, Inhale 2 puffs into the lungs every 6 (six) hours as needed for wheezing or shortness of breath., Disp: , Rfl:  .  fluticasone (FLONASE) 50 MCG/ACT nasal spray, Place 2 sprays into both nostrils as needed. , Disp: , Rfl:  .  furosemide (LASIX) 20 MG tablet, Take 0.5 tablets (10 mg total) by mouth daily. Take with lasix/furosemide, Disp: 30 tablet, Rfl: 3 .  levothyroxine (SYNTHROID) 88 MCG tablet, Take 1 tablet (88 mcg total) by mouth daily before breakfast., Disp: 90 tablet, Rfl: 1 .  lisinopril (ZESTRIL) 20 MG tablet, Take 1 tablet (20 mg total) by mouth daily., Disp: 90 tablet,  Rfl: 3 .  metoprolol tartrate (LOPRESSOR) 25 MG tablet, Take 3 tablets (75 mg total) by mouth 2 (two) times daily., Disp: 180 tablet, Rfl: 5 .  omeprazole (PRILOSEC) 20 MG capsule, Take 1 capsule (20 mg total) by mouth at bedtime., Disp: 90 capsule, Rfl: 1 .  potassium chloride SA (KLOR-CON) 20 MEQ tablet, Take 1 tablet (20 mEq total) by mouth daily., Disp: 30 tablet, Rfl: 3  No Known Allergies  I personally reviewed active problem list, medication list, allergies, family history, social history, health maintenance, notes from last encounter, lab results, imaging with the patient/caregiver today.   Review of Systems  10 Systems reviewed and are negative for acute change except as noted in the HPI.   Objective:   Virtual encounter, vitals limited, only able to obtain the following Today's Vitals   02/18/20 1317  BP: (!) 166/117  Pulse: 77   There is no height or weight on file to calculate BMI. Nursing Note and Vital Signs reviewed.  Physical Exam Normal phonation, no audible respiratory distress no audible wheeze or stridor, answering questions appropriately, patient alert PE limited by telephone encounter  No results found for this or any previous visit (from the past 72 hour(s)).  Assessment and Plan:     ICD-10-CM   1. Essential hypertension  I10   2. Pleural effusion on right  J90 furosemide (LASIX) 20 MG tablet  3. Persistent atrial fibrillation (HCC)  I48.19 furosemide (LASIX) 20 MG tablet  4. DOE (dyspnea on exertion)  R06.00   Patient was due today to follow-up on his blood pressure, has established with cardiology. Unfortunately today's blood pressure is significantly worse than in the past, despite adding on medications and increasing dose.  Based off of the symptoms I suspect that his pleural effusion is much worse with increasing weight, exertional dyspnea and orthopnea.  He has continued his metoprolol dosing that his A. fib continues to be rate controlled heart  rate 70 to 80s.  He did have a Lexiscan/15-minute stress test about 4 days ago, he said at that time his blood pressure was very elevated and they were going to send him to the ER but then he was able to go home.  I am unable to see any results or notes from this testing that took place in the hospital.   The patient has not had any other changes in his medication or diet.  He has been taking Lasix daily, previously when he started Lasix his weight dropped a significant amount his lower extremity edema improved his orthopnea and dyspnea improved significantly  We have been monitoring his renal function and doing serial x-rays to monitor his pleural effusion, he had some improvement initially but then had stable effusion and patient continued to refuse doing the thoracentesis  outpatient procedure but I did encourage him to get it done.  He wanted to wait and see if he continues to improve with taking the Lasix.  Have been suspicious of heart failure due to increasing blood pressures lack of atrial kick with persistent A. fib, suspect that his pleural effusion is secondary to that his CT scan in the past did not show any lymphadenopathy, masses malignancy, and he has no history of lung disease asthma or COPD is also never smoked.  He does have a echo scheduled about 3 to 4 weeks from now.  He does see Pulmonology tomorrow Dr. Melvyn Novas -hopefully they will be able to set up thoracentesis.  I explained the procedure to him again explained how it will treat his symptoms he should be able to breathe better would also diagnose the cause of his fluid which I believe is transudate.  Did reach out to cardiology to see if they could possibly suggest which blood pressure medications to add I did start hydralazine 25 to 50 mg up to 3 times a day to try and keep his blood pressure under 150/90.     Heard Back from Dr. Loni Muse - he advised increasing lisinopril to 40 mg daily and metoprolol 100 MG BID first  - then could add  CCB/norvasc if BP not improved  Did discuss with Dean Anderson today at length the concerning signs and symptoms which had warm to go straight to the ER or call 911 for me evaluation he verbalized understanding.  -Red flags and when to present for emergency care or RTC including fever >101.75F, chest pain, shortness of breath, new/worsening/un-resolving symptoms, reviewed with patient at time of visit. Follow up and care instructions discussed and provided in AVS. - I discussed the assessment and treatment plan with the patient. The patient was provided an opportunity to ask questions and all were answered. The patient agreed with the plan and demonstrated an understanding of the instructions.  I provided 45+ minutes of non-face-to-face time during this encounter.  Delsa Grana, PA-C 02/18/20 1:40 PM

## 2020-02-19 ENCOUNTER — Encounter: Payer: Self-pay | Admitting: Internal Medicine

## 2020-02-19 ENCOUNTER — Inpatient Hospital Stay: Admission: RE | Admit: 2020-02-19 | Payer: Medicaid Other | Source: Ambulatory Visit

## 2020-02-19 ENCOUNTER — Ambulatory Visit: Payer: Medicaid Other | Admitting: Internal Medicine

## 2020-02-19 ENCOUNTER — Encounter: Payer: Self-pay | Admitting: *Deleted

## 2020-02-19 ENCOUNTER — Other Ambulatory Visit: Payer: Self-pay

## 2020-02-19 DIAGNOSIS — E871 Hypo-osmolality and hyponatremia: Secondary | ICD-10-CM

## 2020-02-19 DIAGNOSIS — R0600 Dyspnea, unspecified: Secondary | ICD-10-CM

## 2020-02-19 DIAGNOSIS — R0609 Other forms of dyspnea: Secondary | ICD-10-CM

## 2020-02-19 DIAGNOSIS — J90 Pleural effusion, not elsewhere classified: Secondary | ICD-10-CM | POA: Diagnosis not present

## 2020-02-19 LAB — CBC WITH DIFFERENTIAL/PLATELET
Basophils Absolute: 0.1 10*3/uL (ref 0.0–0.1)
Basophils Relative: 0.7 % (ref 0.0–3.0)
Eosinophils Absolute: 0.2 10*3/uL (ref 0.0–0.7)
Eosinophils Relative: 2.4 % (ref 0.0–5.0)
HCT: 45.7 % (ref 39.0–52.0)
Hemoglobin: 15.2 g/dL (ref 13.0–17.0)
Lymphocytes Relative: 10.1 % — ABNORMAL LOW (ref 12.0–46.0)
Lymphs Abs: 0.8 10*3/uL (ref 0.7–4.0)
MCHC: 33.3 g/dL (ref 30.0–36.0)
MCV: 89.2 fl (ref 78.0–100.0)
Monocytes Absolute: 0.9 10*3/uL (ref 0.1–1.0)
Monocytes Relative: 11.4 % (ref 3.0–12.0)
Neutro Abs: 5.6 10*3/uL (ref 1.4–7.7)
Neutrophils Relative %: 75.4 % (ref 43.0–77.0)
Platelets: 240 10*3/uL (ref 150.0–400.0)
RBC: 5.12 Mil/uL (ref 4.22–5.81)
RDW: 13.9 % (ref 11.5–15.5)
WBC: 7.5 10*3/uL (ref 4.0–10.5)

## 2020-02-19 LAB — BASIC METABOLIC PANEL
BUN: 10 mg/dL (ref 6–23)
CO2: 26 mEq/L (ref 19–32)
Calcium: 9.2 mg/dL (ref 8.4–10.5)
Chloride: 97 mEq/L (ref 96–112)
Creatinine, Ser: 1.01 mg/dL (ref 0.40–1.50)
GFR: 77.66 mL/min (ref >60.00)
Glucose, Bld: 109 mg/dL — ABNORMAL HIGH (ref 70–99)
Potassium: 4.5 mEq/L (ref 3.5–5.1)
Sodium: 130 mEq/L — ABNORMAL LOW (ref 135–145)

## 2020-02-19 LAB — TSH: TSH: 3.11 u[IU]/mL (ref 0.35–4.50)

## 2020-02-19 LAB — SEDIMENTATION RATE: Sed Rate: 11 mm/hr (ref 0–20)

## 2020-02-19 NOTE — Patient Instructions (Addendum)
Double your lasix dose and your potassium dose for now   Please remember to go to the lab department   for your tests - we will call you with the results when they are available.     We will call you for your Thoracentesis study asap.  No work for two weeks - can extend this if needed until we complete the work up.

## 2020-02-19 NOTE — Progress Notes (Signed)
[name], male    DOB: [birth year],   MRN: [id number]   Brief patient profile:  68  yowm never smoker works Editor, commissioning acute onset sob [place] headed > admitted [place] with afib  Assoc with severe hbp 09/2018 then worse sob early 2021  Dx and R  effusion with plan to tap on 12/28/19 but screened Pos covid 19 so delayed and breathing gradually worse and referred to pulmonary clinic 02/19/2020 by Dr  Marinus Maw cardiology p nl stress  lexiscan 02/14/20.  Note hx AF but unable to anticoagulate due to GIB  Anemia with source not identified.         History of Present Illness  02/19/2020  Pulmonary/ 1st office eval/Pricilla Moehle  Chief Complaint  Patient presents with  . Pulmonary Consult    Referred by Dr Debbe Odea for eval of pleural effusion.  Pt c/o SOB for the past year, worse for the past month. He is using his albuterol inhaler 2-3 x per day.   Dyspnea: indolent onset with acute event/ progressive to point of Room to room/ stopped working 02/15/20 - still comfortable at rest  Cough: dry  Sleep: bed is flat, one pillow better with left side down  SABA use: helps some   No obvious day to day or daytime variability or assoc excess/ purulent sputum or mucus plugs or hemoptysis or cp or chest tightness, subjective wheeze or overt sinus or hb symptoms.   Sleeping as above without nocturnal  or early am exacerbation  of respiratory  c/o's or need for noct saba. Also denies any obvious fluctuation of symptoms with weather or environmental changes or other aggravating or alleviating factors except as outlined above   No unusual exposure hx or h/o childhood pna/ asthma or knowledge of premature birth.  Current Allergies, Complete Past Medical History, Past Surgical History, Family History, and Social History were reviewed in [employer] record.  ROS  The following are not active complaints unless bolded Hoarseness, sore throat, dysphagia, dental problems, itching,  sneezing,  nasal congestion or discharge of excess mucus or purulent secretions, ear ache,   fever, chills, sweats, unintended wt loss or wt gain, classically pleuritic or exertional cp,  orthopnea pnd or arm/hand swelling  or leg swelling, presyncope, palpitations, abdominal pain, anorexia, nausea, vomiting, diarrhea  or change in bowel habits or change in bladder habits, change in stools or change in urine, dysuria, hematuria,  rash, arthralgias, visual complaints, headache, numbness, weakness or ataxia or problems with walking or coordination,  change in mood or  memory.           Past Medical History:  Diagnosis Date  . Anemia   . Atrial fibrillation (HCC)   . GERD (gastroesophageal reflux disease)   . Hypertension   . Thyroid disease     Outpatient Medications Prior to Visit  Medication Sig Dispense Refill  . albuterol (VENTOLIN HFA) 108 (90 Base) MCG/ACT inhaler Inhale 2 puffs into the lungs every 6 (six) hours as needed for wheezing or shortness of breath.    . furosemide (LASIX) 20 MG tablet Take 1 tablet (20 mg total) by mouth daily. Take with potassium 30 tablet 3  . hydrALAZINE (APRESOLINE) 25 MG tablet Take 1-2 tablets (25-50 mg total) by mouth 3 (three) times daily as needed. Take for BP >150/90 90 tablet 2  . levothyroxine (SYNTHROID) 88 MCG tablet Take 1 tablet (88 mcg total) by mouth daily before breakfast. 90 tablet 1  . lisinopril (ZESTRIL)  40 MG tablet Take 1 tablet (40 mg total) by mouth daily. 90 tablet 3  . omeprazole (PRILOSEC) 20 MG capsule Take 1 capsule (20 mg total) by mouth at bedtime. 90 capsule 1  . potassium chloride SA (KLOR-CON) 20 MEQ tablet Take 1 tablet (20 mEq total) by mouth daily. 30 tablet 3  . fluticasone (FLONASE) 50 MCG/ACT nasal spray Place 2 sprays into both nostrils as needed for allergies or rhinitis. (Patient not taking: Reported on 02/19/2020) 16 g 5         Objective:     BP (!) 150/90 (BP Location: Left Arm, Cuff Size: Normal)   Pulse  82   Temp 97.7 F (36.5 C) (Temporal)   Ht 5\' 8"  (1.727 m)   Wt 217 lb (98.4 kg)   SpO2 96% Comment: on RA  BMI 32.99 kg/m   SpO2: 96 %(on RA)   amb pleasant wm nad    HEENT : pt wearing mask not removed for exam due to covid -19 concerns.    NECK :  without JVD/Nodes/TM/ nl carotid upstrokes bilaterally   LUNGS: no acc muscle use,  Nl contour chest with decreased bs/ dullness 2/3 on R  without cough on insp or exp maneuvers   CV:  IRIR   no s3 or murmur or increase in P2, and no edema   ABD:  soft and nontender with nl inspiratory excursion in the supine position. No bruits or organomegaly appreciated, bowel sounds nl  MS:  Nl gait/ ext warm without deformities, calf tenderness, cyanosis or clubbing No obvious joint restrictions   SKIN: warm and dry without lesions    NEURO:  alert, approp, nl sensorium with  no motor or cerebellar deficits apparent.     I personally reviewed images and agree with radiology impression as follows:  CXR:  PA and Lat 01/28/20  Moderate right pleural effusion, unchanged. No frank interstitial edema. Left lung is clear. No pneumothorax. The heart is normal in size. Moderate hiatal hernia.   I personally reviewed images and agree with radiology impression as follows:   Chest CTa  01/02/20 1. No evidence of pulmonary embolism. 2. Large right pleural effusion with loculated component along the anterolateral aspect of the lower right hemithorax Effusion measures simple fluid density. No pleural enhancement. Complete atelectasis of the right middle and lower lobes. 3. Trace left pleural effusion. 4. Moderate to large hiatal hernia.   Labs ordered/ reviewed:      Chemistry      Component Value Date/Time   NA 130 (L) 02/19/2020 1030   NA 138 04/13/2019 1156   K 4.5 02/19/2020 1030   CL 97 02/19/2020 1030   CO2 26 02/19/2020 1030   BUN 10 02/19/2020 1030   BUN 8 04/13/2019 1156   CREATININE 1.01 02/19/2020 1030   CREATININE 0.96  01/28/2020 1545      Component Value Date/Time   CALCIUM 9.2 02/19/2020 1030   ALKPHOS 44 01/07/2020 1722   AST 16 01/28/2020 1545   ALT 17 01/28/2020 1545   BILITOT 1.3 (H) 01/28/2020 1545   BILITOT 1.4 (H) 06/13/2019 1337        Lab Results  Component Value Date   WBC 7.5 02/19/2020   HGB 15.2 02/19/2020   HCT 45.7 02/19/2020   MCV 89.2 02/19/2020   PLT 240.0 02/19/2020       EOS  0.2                                     02/19/2020       Lab Results  Component Value Date   TSH 3.11 02/19/2020         Lab Results  Component Value Date   ESRSEDRATE 11 02/19/2020           Assessment   Pleural effusion on right  Onset of symptoms early 2021 assoc with hbp/afib with neg cards w/u 02/14/20  - Thoracentesis planned for 02/20/2020    ddx includes malignancy for sure in pt with unexplained gib but loculations on ct are more typical of a late parapneumonic process and perhaps he just doesn't recall the illness that initiated the events leading to the loculations and 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  First step is proceed now with T centesis with broad ddx and then recheck cxr for possible underlying masses and go from there.   Discussed in detail all the  indications, usual  risks and alternatives  relative to the benefits with patient who agrees to proceed with w/u as outlined.        DOE (dyspnea on exertion) No evidence PE, chf, thyroid dz or anemia or renal dz at this point.   Hyponatremia Noted 02/19/2020   Mild but assoc with 3rd spaced fluid on lasix with likely excess water intake - for now will just follow this problem and advise pt to reduce water/fluid intake and double the lasix / k dose.    Each maintenance medication was reviewed in detail including emphasizing most importantly the difference between maintenance and prns and under what circumstances the prns are to be triggered using an action  plan format where appropriate.  Total time for H and P, chart review, counseling, teaching device and generating customized AVS unique to this initial office visit / charting = 60 min         Sandrea Hughs, MD 02/19/2020

## 2020-02-20 ENCOUNTER — Other Ambulatory Visit: Payer: Self-pay | Admitting: Interventional Radiology

## 2020-02-20 ENCOUNTER — Ambulatory Visit
Admission: RE | Admit: 2020-02-20 | Discharge: 2020-02-20 | Disposition: A | Discharge status: Home / Self Care | Payer: Medicaid Other | Source: Ambulatory Visit | Attending: Internal Medicine | Admitting: Internal Medicine

## 2020-02-20 ENCOUNTER — Other Ambulatory Visit: Payer: Self-pay

## 2020-02-20 ENCOUNTER — Encounter: Payer: Self-pay | Admitting: Internal Medicine

## 2020-02-20 ENCOUNTER — Ambulatory Visit
Admission: RE | Admit: 2020-02-20 | Discharge: 2020-02-20 | Disposition: A | Discharge status: Home / Self Care | Payer: Medicaid Other | Source: Ambulatory Visit | Attending: Interventional Radiology | Admitting: Interventional Radiology

## 2020-02-20 DIAGNOSIS — J90 Pleural effusion, not elsewhere classified: Secondary | ICD-10-CM | POA: Insufficient documentation

## 2020-02-20 DIAGNOSIS — J918 Pleural effusion in other conditions classified elsewhere: Secondary | ICD-10-CM | POA: Diagnosis not present

## 2020-02-20 DIAGNOSIS — E871 Hypo-osmolality and hyponatremia: Secondary | ICD-10-CM | POA: Insufficient documentation

## 2020-02-20 DIAGNOSIS — Z9889 Other specified postprocedural states: Secondary | ICD-10-CM

## 2020-02-20 LAB — BODY FLUID CELL COUNT WITH DIFFERENTIAL
Eos, Fluid: 0 %
Lymphs, Fluid: 55 %
Monocyte-Macrophage-Serous Fluid: 21 %
Neutrophil Count, Fluid: 24 %
Total Nucleated Cell Count, Fluid: 267 cu mm

## 2020-02-20 LAB — ALBUMIN, PLEURAL OR PERITONEAL FLUID: Albumin, Fluid: 2.5 g/dL

## 2020-02-20 LAB — LACTATE DEHYDROGENASE, PLEURAL OR PERITONEAL FLUID: LD, Fluid: 59 U/L — ABNORMAL HIGH (ref 3–23)

## 2020-02-20 LAB — GLUCOSE, PLEURAL OR PERITONEAL FLUID: Glucose, Fluid: 106 mg/dL

## 2020-02-20 LAB — PROTEIN, PLEURAL OR PERITONEAL FLUID: Total protein, fluid: 3.9 g/dL

## 2020-02-20 NOTE — Assessment & Plan Note (Signed)
Noted 02/19/2020   Mild but assoc with 3rd spaced fluid on lasix with likely excess water intake - for now will just follow this problem and advise pt to reduce water/fluid intake and double the lasix / k dose.

## 2020-02-20 NOTE — Assessment & Plan Note (Signed)
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  First step is proceed now with T centesis with broad ddx and then recheck cxr for possible underlying masses and go from there.   Discussed in detail all the  indications, usual  risks and alternatives  relative to the benefits with patient who agrees to proceed with w/u as outlined.            Each maintenance medication was reviewed in detail including emphasizing most importantly the difference between maintenance and prns and under what circumstances the prns are to be triggered using an action plan format where appropriate.  Total time for H and P, chart review, counseling, teaching device and generating customized AVS unique to this initial office visit / charting = 60 min

## 2020-02-20 NOTE — Progress Notes (Signed)
COVID test does not appear to be resulted in the system. Voicemail was left for patient on cell phone (preferred phone) for patient to call and discuss prior to procedure this afternoon.   Craige Cotta, RN  02/20/2020

## 2020-02-20 NOTE — Assessment & Plan Note (Signed)
No evidence PE, chf, thyroid dz or anemia or renal dz at this point.

## 2020-02-21 LAB — TRIGLYCERIDES, BODY FLUIDS: Triglycerides, Fluid: 45 mg/dL

## 2020-02-21 NOTE — Progress Notes (Signed)
Spoke with pt and notified of results per Dr. Wert. Pt verbalized understanding and denied any questions. 

## 2020-02-22 LAB — CYTOLOGY - NON PAP

## 2020-02-26 ENCOUNTER — Other Ambulatory Visit: Payer: Self-pay | Admitting: Internal Medicine

## 2020-02-26 DIAGNOSIS — J90 Pleural effusion, not elsewhere classified: Secondary | ICD-10-CM

## 2020-02-26 NOTE — Progress Notes (Signed)
Referral made 

## 2020-02-28 ENCOUNTER — Other Ambulatory Visit: Payer: Self-pay | Admitting: Thoracic Surgery (Cardiothoracic Vascular Surgery)

## 2020-02-28 DIAGNOSIS — J90 Pleural effusion, not elsewhere classified: Secondary | ICD-10-CM

## 2020-02-28 NOTE — Progress Notes (Signed)
[address]       [place] [address]             [phone]                    [facility] Medical Record #[id number] Date of Birth: 09-Sep-1966  Referring: Nyoka Cowden, MD Primary Care: Danelle Berry, PA-C Primary Cardiologist: Debbe Odea, MD  Chief Complaint:    Chief Complaint  Patient presents with  . Pleural Effusion    Surgical eval with CXR, HX of RIGHT THORACENTESIS     History of Present Illness:    Dean Anderson 54 y.o. male presents for surgical recurrent right-sided pleural effusion.  The patient states that over the past year he has noticed some increased work of breathing and fatigue.  He has had 2 L of fluid drained the most recent thoracentesis, but continues to have some shortness of breath.  He was diagnosed with Covid infection in January of this year and states that he never had any symptoms.  He does have a CT scan dating back to July of last year when it was noted that he had a large hernia.  This is during the work-up of his GI bleed.  He was placed on blood thinners but became anemic and this subsequently was stopped.  Regards to his hiatal hernia he does have a remote history of reflux but denies any significant dysphagia or odynophagia.  He does state that his throat is sore in the morning he has some hoarseness.  In regards to chronic cough this is mostly been over the last 6 months.  On occasion he will wake in the middle the night and feel like he needs to throw up.    Smoking Hx: Non-smoker   Zubrod Score: At the time of surgery this patient's most appropriate activity status/level should be described as: []     0    Normal activity, no symptoms []     1    Restricted in physical strenuous activity but ambulatory, able to do out light work []     2    Ambulatory and capable of self care, unable to do work activities, up and about               >50 % of waking hours                              []     3     Only limited self care, in bed greater than 50% of waking hours []     4    Completely disabled, no self care, confined to bed or chair []     5    Moribund   Past Medical History:  Diagnosis Date  . Anemia   . Atrial fibrillation (HCC)   . GERD (gastroesophageal reflux disease)   . Hypertension   . Thyroid disease     Past Surgical History:  Procedure Laterality Date  . COLONOSCOPY WITH PROPOFOL N/A 04/17/2019   Procedure: COLONOSCOPY WITH PROPOFOL;  Surgeon: , MD;  Location: [facility] ENDOSCOPY;  Service: Endoscopy;  Laterality: N/A;  . ESOPHAGOGASTRODUODENOSCOPY (EGD) WITH PROPOFOL N/A 04/17/2019   Procedure: ESOPHAGOGASTRODUODENOSCOPY (EGD) WITH PROPOFOL;  Surgeon: , MD;  Location: [facility] ENDOSCOPY;  Service: Endoscopy;  Laterality: N/A;  . GIVENS CAPSULE STUDY  05/2019   negative    Family History  Problem Relation Age of Onset  . Hypertension Mother   . Thyroid disease Mother   . Stroke Father 45  . Liver cancer Maternal Uncle   . Heart disease Brother   . Hypertension Brother      Social History   Tobacco Use  Smoking Status Never Smoker  Smokeless Tobacco Never Used    Social History   Substance and Sexual Activity  Alcohol Use Yes  . Alcohol/week: 70.0 standard drinks  . Types: 70 Cans of beer per week   Comment: 6 beers a day     No Known Allergies  Current Outpatient Medications  Medication Sig Dispense Refill  . albuterol (VENTOLIN HFA) 108 (90 Base) MCG/ACT inhaler Inhale 2 puffs into the lungs every 6 (six) hours as needed for wheezing or shortness of breath.    . fluticasone (FLONASE) 50 MCG/ACT nasal spray Place 2 sprays into both nostrils as needed for allergies or rhinitis. (Patient not taking: Reported on 02/19/2020) 16 g 5  . furosemide (LASIX) 20 MG tablet Take 1 tablet (20 mg total) by mouth daily. Take with potassium 30 tablet 3  . hydrALAZINE (APRESOLINE) 25 MG tablet Take 1-2 tablets (25-50 mg total) by mouth 3 (three)  times daily as needed. Take for BP >150/90 90 tablet 2  . levothyroxine (SYNTHROID) 88 MCG tablet Take 1 tablet (88 mcg total) by mouth daily before breakfast. 90 tablet 1  . lisinopril (ZESTRIL) 40 MG tablet Take 1 tablet (40 mg total) by mouth daily. 90 tablet 3  . omeprazole (PRILOSEC) 20 MG capsule Take 1 capsule (20 mg total) by mouth at bedtime. 90 capsule 1  . potassium chloride SA (KLOR-CON) 20 MEQ tablet Take 1 tablet (20 mEq total) by mouth daily. 30 tablet 3   No current facility-administered medications for this visit.    Review of Systems  Constitutional: Positive for malaise/fatigue. Negative for chills and fever.  Respiratory: Positive for cough and shortness of breath.   Cardiovascular: Negative.   Gastrointestinal: Positive for heartburn and vomiting. Negative for abdominal pain and nausea.  Musculoskeletal: Negative.   Neurological: Negative.      PHYSICAL EXAMINATION: BP 123/74   Pulse 88   Temp 98.2 F (36.8 C) (Skin)   Resp 20   Ht 5\' 8"  (1.727 m)   Wt 211 lb (95.7 kg)   SpO2 97% Comment: RA  BMI 32.08 kg/m  Physical Exam  Constitutional: He is oriented to person, place, and time. He appears well-developed and well-nourished. No distress.  HENT:  Head: Normocephalic and atraumatic.  Eyes: Conjunctivae and EOM are normal.  Neck: No tracheal deviation present.  Cardiovascular: Normal rate and normal heart sounds.  No murmur heard. Irregular rhythm  Respiratory: Effort normal. No respiratory distress.  Decreased along the right base  GI: He exhibits no distension.  Musculoskeletal:        General: Normal range of motion.     Cervical back: Normal range of motion.  Neurological: He is oriented to person, place, and time.  Skin: Skin is warm and dry. He is not diaphoretic.    Diagnostic Studies & Laboratory data:     Recent Radiology Findings:   DG Chest 2 View  Result Date: 02/29/2020 CLINICAL DATA:  Follow-up pleural effusion EXAM: CHEST - 2  VIEW COMPARISON:  02/20/2019 FINDINGS: Cardiac shadow is mildly enlarged but stable. Hiatal hernia is again noted. Small right-sided pleural effusion is again seen with underlying atelectasis. The overall appearance is similar to that seen  on the prior exam. IMPRESSION: Right pleural effusion stable from the prior study. No new focal abnormality is seen. Electronically Signed   By: Alcide Clever M.D.   On: 02/29/2020 13:44   NM Myocar Multi W/Spect W/Wall Motion / EF  Result Date: 02/15/2020 Pharmacological myocardial perfusion imaging study with no significant  ischemia Normal wall motion, EF estimated at 73% No EKG changes concerning for ischemia at peak stress or in recovery. CT attenuation correction images with no coronary calcification, no aortic atherosclerosis Large right pleural effusion again noted Low risk scan Signed, Dossie Arbour, MD, Ph.D [facility]   DG Chest Port 1 View  Result Date: 02/20/2020 CLINICAL DATA:  Status post thoracentesis EXAM: PORTABLE CHEST 1 VIEW COMPARISON:  Chest radiograph 01/28/2020 FINDINGS: Stable large cardiac silhouette. Large hiatal hernia noted posterior to the LEFT heart. Moderate-sized RIGHT pleural effusion.  No pneumothorax evident IMPRESSION: 1. No pneumothorax following thoracentesis. 2. Moderate volume RIGHT pleural effusion. Electronically Signed   By: Genevive Bi M.D.   On: 02/20/2020 14:27   US THORACENTESIS ASP PLEURAL SPACE W/IMG GUIDE  Result Date: 02/20/2020 INDICATION: 54 year old male with large right-sided pleural effusion. EXAM: ULTRASOUND GUIDED RIGHT THORACENTESIS MEDICATIONS: None. COMPLICATIONS: Chest x-ray 01/28/2020 PROCEDURE: An ultrasound guided thoracentesis was thoroughly discussed with the patient and questions answered. The benefits, risks, alternatives and complications were also discussed. The patient understands and wishes to proceed with the procedure. Written consent was obtained. Ultrasound was performed to localize and  mark an adequate pocket of fluid in the right chest. The area was then prepped and draped in the normal sterile fashion. 1% Lidocaine was used for local anesthesia. Under ultrasound guidance a 6 Fr Safe-T-Centesis catheter was introduced. Thoracentesis was performed. The catheter was removed and a dressing applied. FINDINGS: A total of approximately 2000 mL of amber colored pleural fluid was removed. Samples were sent to the laboratory as requested by the clinical team. IMPRESSION: Successful ultrasound guided right thoracentesis yielding 2 L of pleural fluid. Electronically Signed   By: Malachy Moan M.D.   On: 02/20/2020 16:08       I have independently reviewed the above radiology studies  and reviewed the findings with the patient.   Recent Lab Findings: Lab Results  Component Value Date   WBC 7.5 02/19/2020   HGB 15.2 02/19/2020   HCT 45.7 02/19/2020   PLT 240.0 02/19/2020   GLUCOSE 109 (H) 02/19/2020   CHOL 181 12/27/2019   TRIG 79 12/27/2019   HDL 52 12/27/2019   LDLCALC 112 (H) 12/27/2019   ALT 17 01/28/2020   AST 16 01/28/2020   NA 130 (L) 02/19/2020   K 4.5 02/19/2020   CL 97 02/19/2020   CREATININE 1.01 02/19/2020   BUN 10 02/19/2020   CO2 26 02/19/2020   TSH 3.11 02/19/2020   INR 1.1 08/31/2019      Problem List: Recurrent right effusion Large hiatal hernia Hx of corona virus infection in Jan of this year. Atrial fibrillation History of GI bleed   Assessment / Plan:   54 yo male with large recurrent right effusion with likley intrapment of the right lower, and middle lobes.  He also has a large hiatal hernia.  It is possible that he may be aspirating, from the hiatal hernia, and developed a pneumonia secondary to this.  It is also possible, that this may be related to his previous Covid infection.  Endoscopy done last year which showed long segment Barrett's but no evidence of Cameron's ulcers.  His colonoscopy and capsule endoscopy negative for sources of  bleeding.  Given the possibility that this pneumonia could be secondary to aspiration from his hiatal hernia I think that it needs Perative that he addressed this time.  He recently saw Dr. Everlene Farrier for treatment of his hiatal hernia and I will discuss this further with him.  He is agreeable to proceed with a robotic assisted paraesophageal hernia repair with fundoplication, right video-assisted thoracoscopy and decortication.   He is currently not on any blood thinners that need to be bridged.  In regards to his atrial fibrillation, he is already had a nuclear medicine study last month which was negative for ischemic disease.     I  spent 40 minutes with  the patient face to face and greater then 50% of the time was spent in counseling and coordination of care.    Corliss Skains 02/29/2020 3:42 PM

## 2020-02-29 ENCOUNTER — Ambulatory Visit
Admission: RE | Admit: 2020-02-29 | Discharge: 2020-02-29 | Disposition: A | Discharge status: Home / Self Care | Payer: Medicaid Other | Source: Ambulatory Visit | Attending: Thoracic Surgery (Cardiothoracic Vascular Surgery) | Admitting: Thoracic Surgery (Cardiothoracic Vascular Surgery)

## 2020-02-29 ENCOUNTER — Encounter: Payer: Self-pay | Admitting: *Deleted

## 2020-02-29 ENCOUNTER — Institutional Professional Consult (permissible substitution): Payer: Medicaid Other | Admitting: Thoracic Surgery (Cardiothoracic Vascular Surgery)

## 2020-02-29 ENCOUNTER — Other Ambulatory Visit: Payer: Self-pay | Admitting: *Deleted

## 2020-02-29 ENCOUNTER — Other Ambulatory Visit: Payer: Self-pay

## 2020-02-29 VITALS — BP 123/74 | HR 88 | Temp 98.2°F | Resp 20 | Ht 68.0 in | Wt 211.0 lb

## 2020-02-29 DIAGNOSIS — J90 Pleural effusion, not elsewhere classified: Secondary | ICD-10-CM

## 2020-02-29 DIAGNOSIS — K449 Diaphragmatic hernia without obstruction or gangrene: Secondary | ICD-10-CM

## 2020-02-29 DIAGNOSIS — I4819 Other persistent atrial fibrillation: Secondary | ICD-10-CM

## 2020-03-06 ENCOUNTER — Other Ambulatory Visit: Payer: Self-pay | Admitting: Family Medicine

## 2020-03-06 NOTE — Telephone Encounter (Signed)
Copied from CRM [id number]. Topic: Quick Communication - Rx Refill/Question >> Mar 06, 2020  5:50 PM Mcneil, Ja-Kwan wrote: Medication: metoprolol tartrate (LOPRESSOR) 25 MG tablet  Has the patient contacted their pharmacy? no  Preferred Pharmacy (with phone number or street name): [facility] [address] [place]), [place] - [address] Phone: [phone]    Fax: [phone]  Agent: Please be advised that RX refills may take up to 3 business days. We ask that you follow-up with your pharmacy.

## 2020-03-06 NOTE — Telephone Encounter (Signed)
Requested medication (s) are due for refill today: yes  Requested medication (s) are on the active medication list: yes  Last refill:  Last filled by another provider  Future visit scheduled: no  Notes to clinic:  Last refilled by historical provider    Requested Prescriptions  Pending Prescriptions Disp Refills   metoprolol tartrate (LOPRESSOR) 25 MG tablet 240 tablet 0    Sig: Take 4 tablets (100 mg total) by mouth 2 (two) times daily.      Cardiovascular:  Beta Blockers Passed - 03/06/2020  5:57 PM      Passed - Last BP in normal range    BP Readings from Last 1 Encounters:  02/29/20 123/74          Passed - Last Heart Rate in normal range    Pulse Readings from Last 1 Encounters:  02/29/20 88          Passed - Valid encounter within last 6 months    Recent Outpatient Visits           2 weeks ago Essential hypertension   [facility] Danelle Berry, PA-C   1 month ago Pleural effusion on right   [facility] Danelle Berry, PA-C   1 month ago Orthopnea   [facility] Danelle Berry, PA-C   2 months ago Shortness of breath   [facility] Danelle Berry, [place]       Future Appointments             In 2 weeks Agbor-Etang, Arlys John, MD [facility]   In 2 months Danelle Berry, PA-C [facility], [facility]

## 2020-03-07 NOTE — Progress Notes (Signed)
[place] ([place]), [place] - [address] [place] ([place]) [place] [address] Phone: [phone] Fax: [phone]      Your procedure is scheduled on Wednesday, March 12, 2020.  Report to [facility] Main Entrance "A" at 6:30 A.M., and check in at the Admitting office.  Call this number if you have problems the morning of surgery:  [phone]  Call [phone] if you have any questions prior to your surgery date Monday-Friday 8am-4pm    Remember:  Do not eat or drink after midnight the night before your surgery    Take these medicines the morning of surgery with A SIP OF WATER:  levothyroxine (SYNTHROID)  metoprolol tartrate (LOPRESSOR) albuterol (VENTOLIN HFA) - if needed fluticasone (FLONASE) - if needed hydrALAZINE (APRESOLINE) - if needed  Please bring all inhalers with you the day of surgery.    As of today, STOP taking any Aspirin (unless otherwise instructed by your surgeon) and Aspirin containing products, Aleve, Naproxen, Ibuprofen, Motrin, Advil, Goody's, BC's, all herbal medications, fish oil, and all vitamins.                      Do not wear jewelry.            Do not wear lotions, powders, colognes, or deodorant.            Do not shave 48 hours prior to surgery.            Do not bring valuables to the hospital.            [facility] is not responsible for any belongings or valuables.  Do NOT Smoke (Tobacco/Vapping) or drink Alcohol 24 hours prior to your procedure If you use a CPAP at night, you may bring all equipment for your overnight stay.   Contacts, glasses, dentures or bridgework may not be worn into surgery.      For patients admitted to the hospital, discharge time will be determined by your treatment team.   Patients discharged the day of surgery will not be allowed to drive home, and someone needs to stay with them for 24 hours.    Special instructions:   [place]- Preparing For  Surgery  Before surgery, you can play an important role. Because skin is not sterile, your skin needs to be as free of germs as possible. You can reduce the number of germs on your skin by washing with CHG (chlorahexidine gluconate) Soap before surgery.  CHG is an antiseptic cleaner which kills germs and bonds with the skin to continue killing germs even after washing.    Oral Hygiene is also important to reduce your risk of infection.  Remember - BRUSH YOUR TEETH THE MORNING OF SURGERY WITH YOUR REGULAR TOOTHPASTE  Please do not use if you have an allergy to CHG or antibacterial soaps. If your skin becomes reddened/irritated stop using the CHG.  Do not shave (including legs and underarms) for at least 48 hours prior to first CHG shower. It is OK to shave your face.  Please follow these instructions carefully.   1. Shower the NIGHT BEFORE SURGERY and the MORNING OF SURGERY with CHG Soap.   2. If you chose to wash your hair, wash your hair first as usual with your normal shampoo.  3. After you shampoo, rinse your hair and body thoroughly to remove the shampoo.  4. Use CHG as you would any other liquid soap. You  can apply CHG directly to the skin and wash gently with a scrungie or a clean washcloth.   5. Apply the CHG Soap to your body ONLY FROM THE NECK DOWN.  Do not use on open wounds or open sores. Avoid contact with your eyes, ears, mouth and genitals (private parts). Wash Face and genitals (private parts)  with your normal soap.   6. Wash thoroughly, paying special attention to the area where your surgery will be performed.  7. Thoroughly rinse your body with warm water from the neck down.  8. DO NOT shower/wash with your normal soap after using and rinsing off the CHG Soap.  9. Pat yourself dry with a CLEAN TOWEL.  10. Wear CLEAN PAJAMAS to bed the night before surgery, wear comfortable clothes the morning of surgery  11. Place CLEAN SHEETS on your bed the night of your first  shower and DO NOT SLEEP WITH PETS.   Day of Surgery:   Do not apply any deodorants/lotions.  Please wear clean clothes to the hospital/surgery center.   Remember to brush your teeth WITH YOUR REGULAR TOOTHPASTE.   Please read over the following fact sheets that you were given.

## 2020-03-07 NOTE — Telephone Encounter (Signed)
Hypertension medication request:02/18/20  Last office visit pertaining to hypertension:  BP Readings from Last 3 Encounters:  02/29/20 123/74  02/20/20 (!) 146/120  02/19/20 (!) 150/90    Lab Results  Component Value Date   CREATININE 1.01 02/19/2020   BUN 10 02/19/2020   NA 130 (L) 02/19/2020   K 4.5 02/19/2020   CL 97 02/19/2020   CO2 26 02/19/2020     No follow-ups on file.

## 2020-03-08 NOTE — Progress Notes (Signed)
Covid swab scheduled for 4/5.  Patient was Covid + 1/22-21.  Patient patient spoke to his wife informed her he doesn't needed to get covid swabbed on Monday since he was + in January per [facility] protocol.

## 2020-03-10 ENCOUNTER — Inpatient Hospital Stay (HOSPITAL_COMMUNITY)
Admission: RE | Admit: 2020-03-10 | Discharge: 2020-03-10 | Disposition: A | Discharge status: Home / Self Care | Payer: Medicaid Other | Source: Ambulatory Visit

## 2020-03-10 ENCOUNTER — Other Ambulatory Visit: Payer: Self-pay

## 2020-03-10 ENCOUNTER — Encounter (HOSPITAL_COMMUNITY): Payer: Self-pay

## 2020-03-10 ENCOUNTER — Encounter (HOSPITAL_COMMUNITY)
Admission: RE | Admit: 2020-03-10 | Discharge: 2020-03-10 | Disposition: A | Discharge status: Home / Self Care | Payer: Medicaid Other | Source: Ambulatory Visit | Attending: Thoracic Surgery (Cardiothoracic Vascular Surgery) | Admitting: Thoracic Surgery (Cardiothoracic Vascular Surgery)

## 2020-03-10 ENCOUNTER — Ambulatory Visit (HOSPITAL_COMMUNITY)
Admission: RE | Admit: 2020-03-10 | Discharge: 2020-03-10 | Disposition: A | Discharge status: Home / Self Care | Payer: Medicaid Other | Source: Ambulatory Visit | Attending: Thoracic Surgery (Cardiothoracic Vascular Surgery) | Admitting: Thoracic Surgery (Cardiothoracic Vascular Surgery)

## 2020-03-10 DIAGNOSIS — K449 Diaphragmatic hernia without obstruction or gangrene: Secondary | ICD-10-CM | POA: Diagnosis not present

## 2020-03-10 DIAGNOSIS — Z01818 Encounter for other preprocedural examination: Secondary | ICD-10-CM | POA: Diagnosis not present

## 2020-03-10 DIAGNOSIS — J90 Pleural effusion, not elsewhere classified: Secondary | ICD-10-CM

## 2020-03-10 HISTORY — DX: Dyspnea, unspecified: R06.00

## 2020-03-10 HISTORY — DX: Hypothyroidism, unspecified: E03.9

## 2020-03-10 HISTORY — DX: Personal history of other diseases of the digestive system: Z87.19

## 2020-03-10 HISTORY — DX: Family history of other specified conditions: Z84.89

## 2020-03-10 LAB — APTT: aPTT: 30 seconds (ref 24–36)

## 2020-03-10 LAB — URINALYSIS, ROUTINE W REFLEX MICROSCOPIC
Bilirubin Urine: NEGATIVE
Glucose, UA: NEGATIVE mg/dL
Hgb urine dipstick: NEGATIVE
Ketones, ur: NEGATIVE mg/dL
Leukocytes,Ua: NEGATIVE
Nitrite: NEGATIVE
Protein, ur: NEGATIVE mg/dL
Specific Gravity, Urine: 1.002 — ABNORMAL LOW (ref 1.005–1.030)
pH: 6 (ref 5.0–8.0)

## 2020-03-10 LAB — SURGICAL PCR SCREEN
MRSA, PCR: NEGATIVE
Staphylococcus aureus: NEGATIVE

## 2020-03-10 LAB — COMPREHENSIVE METABOLIC PANEL
ALT: 20 U/L (ref 0–44)
AST: 21 U/L (ref 15–41)
Albumin: 3.6 g/dL (ref 3.5–5.0)
Alkaline Phosphatase: 36 U/L — ABNORMAL LOW (ref 38–126)
Anion gap: 13 (ref 5–15)
BUN: 9 mg/dL (ref 6–20)
CO2: 17 mmol/L — ABNORMAL LOW (ref 22–32)
Calcium: 9 mg/dL (ref 8.9–10.3)
Chloride: 101 mmol/L (ref 98–111)
Creatinine, Ser: 0.77 mg/dL (ref 0.61–1.24)
GFR calc Af Amer: >60 mL/min (ref >60)
GFR calc non Af Amer: >60 mL/min (ref >60)
Glucose, Bld: 99 mg/dL (ref 70–99)
Potassium: 4.4 mmol/L (ref 3.5–5.1)
Sodium: 131 mmol/L — ABNORMAL LOW (ref 135–145)
Total Bilirubin: 1.9 mg/dL — ABNORMAL HIGH (ref 0.3–1.2)
Total Protein: 7 g/dL (ref 6.5–8.1)

## 2020-03-10 LAB — BLOOD GAS, ARTERIAL
Acid-base deficit: 4.2 mmol/L — ABNORMAL HIGH (ref 0.0–2.0)
Bicarbonate: 19.7 mmol/L — ABNORMAL LOW (ref 20.0–28.0)
Drawn by: 421801
FIO2: 21
O2 Saturation: 98.3 %
Patient temperature: 37
pCO2 arterial: 32.5 mmHg (ref 32.0–48.0)
pH, Arterial: 7.401 (ref 7.350–7.450)
pO2, Arterial: 128 mmHg — ABNORMAL HIGH (ref 83.0–108.0)

## 2020-03-10 LAB — TYPE AND SCREEN
ABO/RH(D): B POS
Antibody Screen: NEGATIVE

## 2020-03-10 LAB — PROTIME-INR
INR: 1.1 (ref 0.8–1.2)
Prothrombin Time: 14.1 seconds (ref 11.4–15.2)

## 2020-03-10 LAB — CBC
HCT: 48.3 % (ref 39.0–52.0)
Hemoglobin: 15.7 g/dL (ref 13.0–17.0)
MCH: 28.6 pg (ref 26.0–34.0)
MCHC: 32.5 g/dL (ref 30.0–36.0)
MCV: 88.1 fL (ref 80.0–100.0)
Platelets: 238 10*3/uL (ref 150–400)
RBC: 5.48 MIL/uL (ref 4.22–5.81)
RDW: 12.8 % (ref 11.5–15.5)
WBC: 6.8 10*3/uL (ref 4.0–10.5)
nRBC: 0 % (ref 0.0–0.2)

## 2020-03-10 LAB — ABO/RH: ABO/RH(D): B POS

## 2020-03-10 NOTE — Progress Notes (Addendum)
PCP - Danelle Berry ([facility] in [place]) Cardiologist - Alex Gardener Pulmonary: Sandrea Hughs   Chest x-ray - 03/10/20 EKG - 03/10/20 Stress Test - 02/14/20 ECHO -  Cardiac Cath - na  Sleep Study - na   Fasting Blood Sugar - na Checks Blood Sugar _____ times a day  Blood Thinner Instructions: na Aspirin Instructions:    COVID TEST-  Pt. Was positive 12/28/19, doesn't need to be retested   Anesthesia review: cardiac/pul. hx. Re checked blood pressure 155/102. Pt. Stated his pressure is his normal even after taking medications. Rechecked blood pressure again prior to pt. Leaving 140/110. Notified Rica Mast, NP. Stated for pt. To call his primary and follow up on his blood pressure. Pt. Verbalized understanding  Of these instructions.   Notified Darl Pikes, Dr. Lucilla Lame nurse, of abnormal cxr. And sodium 131   Patient denies shortness of breath, fever, cough and chest pain at PAT appointment   All instructions explained to the patient, with a verbal understanding of the material. Patient agrees to go over the instructions while at home for a better understanding. Patient also instructed to self quarantine after being tested for COVID-19. The opportunity to ask questions was provided.

## 2020-03-10 NOTE — Progress Notes (Signed)
Anesthesia Chart Review:   Case: [id number] Date/Time: 03/12/20 0815   Procedures:      ESOPHAGOGASTRODUODENOSCOPY (EGD) (N/A )     XI ROBOTIC ASSISTED HIATAL HERNIA REPAIR (N/A Chest)     VIDEO ASSISTED THORACOSCOPY (VATS)/DECORTICATION (Right )   Anesthesia type: General   Pre-op diagnosis:      PLEURAL EFFUSION     HIATAL HERNIA   [facility]: [facility] ROOM 10 / [facility] OR   Surgeons: Lajuana Matte, MD      DISCUSSION:  Pt is 54 years old with hx atrial fibrillation (not on anticoagulation due to hx GI bleed), HTN, anemia, hypothyroidism  Saw cardiologist Kate Sable, MD for c/o SOB 02/01/20- echo and stress test ordered.  Stress test low risk; echo has not yet been done. Dr. Garen Lah referred pt to pulmonology.   Pt tested positive for COVID 12/28/19  BP at pre-admission testing initially 141/109, recheck 155/102.  And end of pre-admission testing visit with RN, recheck BP was 140/110. Pt reports he is taking his metoprolol as prescribed.  Pt advised to reach out to prescriber (PCP?) about BP management. I will also route this note to PCP for f/u purposes.   I left voicemail message for Manuela Schwartz in Dr. Abran Duke office regarding echo status and elevated BP.     VS: BP (!) 140/110   Pulse 74   Temp 37 C (Oral)   Resp 20   Ht 5\' 8"  (1.727 m)   Wt 97 kg   SpO2 100%   BMI 32.52 kg/m    PROVIDERS: - PCP is Delsa Grana, PA-C - Cardiologist is Kate Sable, MD.  Initial visit to establish care for afib on 02/01/20.  (previously saw cardiologist at [facility] clinic - see notes in care everywhere).  Pt also c/o SOB- echo and stress test ordered, and pt referred to pulmonology. Stress test low risk; echo has not yet been done.  - Saw pulmonologist Christinia Gully, MD on 02/19/20. Thoracentesis recommended which took place 02/20/20 (2L pleural fluid removed); Dr. Melvyn Novas also referred pt to Dr. Kipp Brood   LABS: Labs reviewed: Acceptable for surgery. (all labs ordered are listed,  but only abnormal results are displayed)  Labs Reviewed  BLOOD GAS, ARTERIAL - Abnormal; Notable for the following components:      Result Value   pO2, Arterial 128 (*)    Bicarbonate 19.7 (*)    Acid-base deficit 4.2 (*)    All other components within normal limits  COMPREHENSIVE METABOLIC PANEL - Abnormal; Notable for the following components:   Sodium 131 (*)    CO2 17 (*)    Alkaline Phosphatase 36 (*)    Total Bilirubin 1.9 (*)    All other components within normal limits  URINALYSIS, ROUTINE W REFLEX MICROSCOPIC - Abnormal; Notable for the following components:   Color, Urine STRAW (*)    Specific Gravity, Urine 1.002 (*)    All other components within normal limits  SURGICAL PCR SCREEN  APTT  CBC  PROTIME-INR  TYPE AND SCREEN  ABO/RH     IMAGES:  CXR 03/10/20:  - Chronic right pleural effusion is larger than was seen 2 weeks ago. - Associated right base atelectasis.  CT angio chest 01/02/20:  1. No evidence of pulmonary embolism. 2. Large right pleural effusion with loculated component along the anterolateral aspect of the lower right hemithorax. Complete atelectasis of the right middle and lower lobes. 3. Trace left pleural effusion. 4. Moderate to large hiatal hernia.  EKG 03/07/20: atrial fibrillation   CV:  Nuclear stress test 02/15/20:  - Pharmacological myocardial perfusion imaging study with no significant  ischemia - Normal wall motion, EF estimated at 73% - No EKG changes concerning for ischemia at peak stress or in recovery. - CT attenuation correction images with no coronary calcification, no aortic atherosclerosis - Large right pleural effusion again noted - Low risk scan    Past Medical History:  Diagnosis Date  . Anemia   . Atrial fibrillation (HCC)   . Dyspnea   . Dysrhythmia    A-fib  . Family history of adverse reaction to anesthesia    brother woke up in a rage after geting anesthesia for teeth surgery  . GERD (gastroesophageal  reflux disease)   . History of hiatal hernia   . Hypertension   . Hypothyroidism   . Thyroid disease     Past Surgical History:  Procedure Laterality Date  . COLONOSCOPY WITH PROPOFOL N/A 04/17/2019   Procedure: COLONOSCOPY WITH PROPOFOL;  Surgeon: Midge Minium, MD;  Location: [facility] ENDOSCOPY;  Service: Endoscopy;  Laterality: N/A;  . ESOPHAGOGASTRODUODENOSCOPY (EGD) WITH PROPOFOL N/A 04/17/2019   Procedure: ESOPHAGOGASTRODUODENOSCOPY (EGD) WITH PROPOFOL;  Surgeon: Midge Minium, MD;  Location: [facility] ENDOSCOPY;  Service: Endoscopy;  Laterality: N/A;  . GIVENS CAPSULE STUDY  05/2019   negative    MEDICATIONS: . albuterol (VENTOLIN HFA) 108 (90 Base) MCG/ACT inhaler  . fluticasone (FLONASE) 50 MCG/ACT nasal spray  . furosemide (LASIX) 20 MG tablet  . hydrALAZINE (APRESOLINE) 25 MG tablet  . ibuprofen (ADVIL) 200 MG tablet  . levothyroxine (SYNTHROID) 88 MCG tablet  . lisinopril (ZESTRIL) 40 MG tablet  . metoprolol tartrate (LOPRESSOR) 25 MG tablet  . omeprazole (PRILOSEC) 20 MG capsule  . potassium chloride SA (KLOR-CON) 20 MEQ tablet   No current facility-administered medications for this encounter.    If BP acceptable day of surgery, I anticipate pt can proceed with surgery as scheduled.  Rica Mast, FNP-BC [facility]/Anesthesiology Phone: [phone] 03/10/2020 1:16 PM

## 2020-03-11 NOTE — Anesthesia Preprocedure Evaluation (Addendum)
Anesthesia Evaluation  Patient identified by MRN, date of birth, ID band Patient awake    Reviewed: Allergy & Precautions, NPO status , Patient's Chart, lab work & pertinent test results  Airway Mallampati: II  TM Distance: >3 FB Neck ROM: Full    Dental  (+) Chipped, Poor Dentition   Pulmonary    Pulmonary exam normal breath sounds clear to auscultation       Cardiovascular hypertension, Pt. on medications and Pt. on home beta blockers Normal cardiovascular exam+ dysrhythmias Atrial Fibrillation  Rhythm:Regular Rate:Normal  ECG: a-fib, rate 74   Neuro/Psych negative neurological ROS  negative psych ROS   GI/Hepatic hiatal hernia, GERD  Medicated and Controlled,(+)     substance abuse  ,   Endo/Other  Hypothyroidism   Renal/GU negative Renal ROS     Musculoskeletal negative musculoskeletal ROS (+)   Abdominal (+) + obese,   Peds  Hematology negative hematology ROS (+)   Anesthesia Other Findings PLEURAL EFFUSION  HIATAL HERNIA  Reproductive/Obstetrics                            Anesthesia Physical Anesthesia Plan  ASA: III  Anesthesia Plan: General   Post-op Pain Management:    Induction: Intravenous  PONV Risk Score and Plan: 2 and Ondansetron, Dexamethasone, Midazolam and Treatment may vary due to age or medical condition  Airway Management Planned: Oral ETT and Double Lumen EBT  Additional Equipment: Arterial line, CVP and Ultrasound Guidance Line Placement  Intra-op Plan:   Post-operative Plan: Extubation in OR and Possible Post-op intubation/ventilation  Informed Consent: I have reviewed the patients History and Physical, chart, labs and discussed the procedure including the risks, benefits and alternatives for the proposed anesthesia with the patient or authorized representative who has indicated his/her understanding and acceptance.     Dental advisory  given  Plan Discussed with: CRNA  Anesthesia Plan Comments:        Anesthesia Quick Evaluation

## 2020-03-12 ENCOUNTER — Inpatient Hospital Stay (HOSPITAL_COMMUNITY): Payer: Medicaid Other | Admitting: Vascular Surgery

## 2020-03-12 ENCOUNTER — Other Ambulatory Visit: Payer: Self-pay

## 2020-03-12 ENCOUNTER — Encounter (HOSPITAL_COMMUNITY)
Admission: RE | Disposition: A | Discharge status: Home / Self Care | Payer: Self-pay | Source: Home / Self Care | Attending: Thoracic Surgery (Cardiothoracic Vascular Surgery)

## 2020-03-12 ENCOUNTER — Inpatient Hospital Stay (HOSPITAL_COMMUNITY): Payer: Medicaid Other | Admitting: Certified Registered Nurse Anesthetist

## 2020-03-12 ENCOUNTER — Encounter (HOSPITAL_COMMUNITY): Payer: Self-pay | Admitting: Thoracic Surgery (Cardiothoracic Vascular Surgery)

## 2020-03-12 ENCOUNTER — Inpatient Hospital Stay (HOSPITAL_COMMUNITY)
Admission: RE | Admit: 2020-03-12 | Discharge: 2020-03-16 | DRG: 326 | Disposition: A | Discharge status: Home / Self Care | Payer: Medicaid Other | Attending: Thoracic Surgery (Cardiothoracic Vascular Surgery) | Admitting: Thoracic Surgery (Cardiothoracic Vascular Surgery)

## 2020-03-12 ENCOUNTER — Inpatient Hospital Stay (HOSPITAL_COMMUNITY): Payer: Medicaid Other

## 2020-03-12 DIAGNOSIS — Z8249 Family history of ischemic heart disease and other diseases of the circulatory system: Secondary | ICD-10-CM

## 2020-03-12 DIAGNOSIS — Z4682 Encounter for fitting and adjustment of non-vascular catheter: Secondary | ICD-10-CM | POA: Diagnosis not present

## 2020-03-12 DIAGNOSIS — K2900 Acute gastritis without bleeding: Secondary | ICD-10-CM | POA: Diagnosis present

## 2020-03-12 DIAGNOSIS — Z8349 Family history of other endocrine, nutritional and metabolic diseases: Secondary | ICD-10-CM

## 2020-03-12 DIAGNOSIS — E039 Hypothyroidism, unspecified: Secondary | ICD-10-CM | POA: Diagnosis present

## 2020-03-12 DIAGNOSIS — F1010 Alcohol abuse, uncomplicated: Secondary | ICD-10-CM | POA: Diagnosis present

## 2020-03-12 DIAGNOSIS — K449 Diaphragmatic hernia without obstruction or gangrene: Principal | ICD-10-CM

## 2020-03-12 DIAGNOSIS — K7689 Other specified diseases of liver: Secondary | ICD-10-CM | POA: Diagnosis present

## 2020-03-12 DIAGNOSIS — Z79899 Other long term (current) drug therapy: Secondary | ICD-10-CM

## 2020-03-12 DIAGNOSIS — J90 Pleural effusion, not elsewhere classified: Secondary | ICD-10-CM | POA: Diagnosis not present

## 2020-03-12 DIAGNOSIS — J9811 Atelectasis: Secondary | ICD-10-CM | POA: Diagnosis not present

## 2020-03-12 DIAGNOSIS — Z09 Encounter for follow-up examination after completed treatment for conditions other than malignant neoplasm: Secondary | ICD-10-CM

## 2020-03-12 DIAGNOSIS — K2270 Barrett's esophagus without dysplasia: Secondary | ICD-10-CM | POA: Diagnosis present

## 2020-03-12 DIAGNOSIS — K761 Chronic passive congestion of liver: Secondary | ICD-10-CM | POA: Diagnosis present

## 2020-03-12 DIAGNOSIS — Z8616 Personal history of COVID-19: Secondary | ICD-10-CM | POA: Diagnosis not present

## 2020-03-12 DIAGNOSIS — R188 Other ascites: Secondary | ICD-10-CM | POA: Diagnosis present

## 2020-03-12 DIAGNOSIS — Z23 Encounter for immunization: Secondary | ICD-10-CM

## 2020-03-12 DIAGNOSIS — Z79890 Hormone replacement therapy (postmenopausal): Secondary | ICD-10-CM | POA: Diagnosis not present

## 2020-03-12 DIAGNOSIS — J869 Pyothorax without fistula: Secondary | ICD-10-CM | POA: Diagnosis present

## 2020-03-12 DIAGNOSIS — I10 Essential (primary) hypertension: Secondary | ICD-10-CM | POA: Diagnosis present

## 2020-03-12 DIAGNOSIS — K219 Gastro-esophageal reflux disease without esophagitis: Secondary | ICD-10-CM | POA: Diagnosis present

## 2020-03-12 DIAGNOSIS — E871 Hypo-osmolality and hyponatremia: Secondary | ICD-10-CM | POA: Diagnosis not present

## 2020-03-12 DIAGNOSIS — Z8719 Personal history of other diseases of the digestive system: Secondary | ICD-10-CM

## 2020-03-12 DIAGNOSIS — Z823 Family history of stroke: Secondary | ICD-10-CM | POA: Diagnosis not present

## 2020-03-12 DIAGNOSIS — R918 Other nonspecific abnormal finding of lung field: Secondary | ICD-10-CM | POA: Diagnosis not present

## 2020-03-12 DIAGNOSIS — J939 Pneumothorax, unspecified: Secondary | ICD-10-CM | POA: Diagnosis not present

## 2020-03-12 DIAGNOSIS — I4819 Other persistent atrial fibrillation: Secondary | ICD-10-CM | POA: Diagnosis present

## 2020-03-12 DIAGNOSIS — Z9889 Other specified postprocedural states: Secondary | ICD-10-CM

## 2020-03-12 HISTORY — PX: VIDEO ASSISTED THORACOSCOPY (VATS)/DECORTICATION: SHX6171

## 2020-03-12 HISTORY — PX: XI ROBOTIC ASSISTED HIATAL HERNIA REPAIR: SHX6889

## 2020-03-12 HISTORY — PX: ESOPHAGOGASTRODUODENOSCOPY: SHX5428

## 2020-03-12 LAB — POCT I-STAT 7, (LYTES, BLD GAS, ICA,H+H)
Acid-base deficit: 6 mmol/L — ABNORMAL HIGH (ref 0.0–2.0)
Acid-base deficit: 4 mmol/L — ABNORMAL HIGH (ref 0.0–2.0)
Bicarbonate: 21.5 mmol/L (ref 20.0–28.0)
Bicarbonate: 21.1 mmol/L (ref 20.0–28.0)
Calcium, Ion: 1.1 mmol/L — ABNORMAL LOW (ref 1.15–1.40)
Calcium, Ion: 1.06 mmol/L — ABNORMAL LOW (ref 1.15–1.40)
HCT: 46 % (ref 39.0–52.0)
HCT: 43 % (ref 39.0–52.0)
Hemoglobin: 15.6 g/dL (ref 13.0–17.0)
Hemoglobin: 14.6 g/dL (ref 13.0–17.0)
O2 Saturation: 99 %
O2 Saturation: 100 %
Patient temperature: 37.2
Patient temperature: 37
Potassium: 5.6 mmol/L — ABNORMAL HIGH (ref 3.5–5.1)
Potassium: 5.4 mmol/L — ABNORMAL HIGH (ref 3.5–5.1)
Sodium: 133 mmol/L — ABNORMAL LOW (ref 135–145)
Sodium: 133 mmol/L — ABNORMAL LOW (ref 135–145)
TCO2: 23 mmol/L (ref 22–32)
TCO2: 22 mmol/L (ref 22–32)
pCO2 arterial: 48 mmHg (ref 32.0–48.0)
pCO2 arterial: 38 mmHg (ref 32.0–48.0)
pH, Arterial: 7.261 — ABNORMAL LOW (ref 7.350–7.450)
pH, Arterial: 7.353 (ref 7.350–7.450)
pO2, Arterial: 155 mmHg — ABNORMAL HIGH (ref 83.0–108.0)
pO2, Arterial: 196 mmHg — ABNORMAL HIGH (ref 83.0–108.0)

## 2020-03-12 LAB — GRAM STAIN

## 2020-03-12 SURGERY — EGD (ESOPHAGOGASTRODUODENOSCOPY)
Anesthesia: General | Site: Chest | Laterality: Right

## 2020-03-12 MED ORDER — METOPROLOL TARTRATE 5 MG/5ML IV SOLN
INTRAVENOUS | Status: AC
  Filled 2020-03-12: qty 5

## 2020-03-12 MED ORDER — PROPOFOL 10 MG/ML IV BOLUS
INTRAVENOUS | Status: AC
  Filled 2020-03-12: qty 40

## 2020-03-12 MED ORDER — SUGAMMADEX SODIUM 200 MG/2ML IV SOLN
INTRAVENOUS | Status: DC | PRN
  Administered 2020-03-12: 500 mg via INTRAVENOUS

## 2020-03-12 MED ORDER — METOPROLOL TARTRATE 5 MG/5ML IV SOLN: 2.5000 mg | INTRAVENOUS | Status: DC | PRN

## 2020-03-12 MED ORDER — PROMETHAZINE HCL 25 MG/ML IJ SOLN: 6.2500 mg | INTRAMUSCULAR | Status: DC | PRN

## 2020-03-12 MED ORDER — KETOROLAC TROMETHAMINE 30 MG/ML IJ SOLN
INTRAMUSCULAR | Status: AC
  Filled 2020-03-12: qty 1

## 2020-03-12 MED ORDER — ESMOLOL HCL 100 MG/10ML IV SOLN
INTRAVENOUS | Status: DC | PRN
  Administered 2020-03-12 (×5): 20 mg via INTRAVENOUS

## 2020-03-12 MED ORDER — KETOROLAC TROMETHAMINE 30 MG/ML IJ SOLN
30.0000 mg | Freq: Once | INTRAMUSCULAR | Status: AC
  Administered 2020-03-12: 30 mg via INTRAVENOUS

## 2020-03-12 MED ORDER — KETOROLAC TROMETHAMINE 15 MG/ML IJ SOLN
15.0000 mg | Freq: Four times a day (QID) | INTRAMUSCULAR | Status: DC
  Administered 2020-03-13: 15 mg via INTRAVENOUS
  Filled 2020-03-12: qty 1

## 2020-03-12 MED ORDER — FOLIC ACID 1 MG PO TABS
1.0000 mg | ORAL_TABLET | Freq: Every day | ORAL | Status: DC
  Administered 2020-03-13 – 2020-03-16 (×4): 1 mg via ORAL
  Filled 2020-03-12 (×4): qty 1

## 2020-03-12 MED ORDER — METOPROLOL TARTRATE 50 MG PO TABS
ORAL_TABLET | ORAL | Status: AC
  Filled 2020-03-12: qty 1

## 2020-03-12 MED ORDER — METOPROLOL TARTRATE 12.5 MG HALF TABLET
25.0000 mg | ORAL_TABLET | Freq: Once | ORAL | Status: AC
  Administered 2020-03-12: 25 mg via ORAL

## 2020-03-12 MED ORDER — AMIODARONE HCL IN DEXTROSE 360-4.14 MG/200ML-% IV SOLN
30.0000 mg/h | INTRAVENOUS | Status: DC
  Administered 2020-03-13 – 2020-03-14 (×3): 30 mg/h via INTRAVENOUS
  Filled 2020-03-12 (×3): qty 200

## 2020-03-12 MED ORDER — VASOPRESSIN 20 UNIT/ML IV SOLN
0.0300 [IU]/min | INTRAVENOUS | Status: DC
  Filled 2020-03-12: qty 2

## 2020-03-12 MED ORDER — PNEUMOCOCCAL VAC POLYVALENT 25 MCG/0.5ML IJ INJ
0.5000 mL | INJECTION | INTRAMUSCULAR | Status: AC | PRN
  Administered 2020-03-16: 0.5 mL via INTRAMUSCULAR
  Filled 2020-03-12: qty 0.5

## 2020-03-12 MED ORDER — SODIUM BICARBONATE 8.4 % IV SOLN
INTRAVENOUS | Status: DC | PRN
  Administered 2020-03-12 (×2): 50 meq via INTRAVENOUS

## 2020-03-12 MED ORDER — SUCCINYLCHOLINE CHLORIDE 20 MG/ML IJ SOLN
INTRAMUSCULAR | Status: DC | PRN
  Administered 2020-03-12: 100 mg via INTRAVENOUS

## 2020-03-12 MED ORDER — PHENYLEPHRINE 40 MCG/ML (10ML) SYRINGE FOR IV PUSH (FOR BLOOD PRESSURE SUPPORT)
PREFILLED_SYRINGE | INTRAVENOUS | Status: AC
  Filled 2020-03-12: qty 10

## 2020-03-12 MED ORDER — EPHEDRINE SULFATE-NACL 50-0.9 MG/10ML-% IV SOSY: PREFILLED_SYRINGE | INTRAVENOUS | Status: DC | PRN

## 2020-03-12 MED ORDER — ROCURONIUM BROMIDE 100 MG/10ML IV SOLN
INTRAVENOUS | Status: DC | PRN
  Administered 2020-03-12 (×3): 30 mg via INTRAVENOUS
  Administered 2020-03-12 (×2): 40 mg via INTRAVENOUS
  Administered 2020-03-12: 30 mg via INTRAVENOUS
  Administered 2020-03-12: 20 mg via INTRAVENOUS
  Administered 2020-03-12: 30 mg via INTRAVENOUS
  Administered 2020-03-12 (×2): 20 mg via INTRAVENOUS

## 2020-03-12 MED ORDER — PHENYLEPHRINE 40 MCG/ML (10ML) SYRINGE FOR IV PUSH (FOR BLOOD PRESSURE SUPPORT)
PREFILLED_SYRINGE | INTRAVENOUS | Status: DC | PRN
  Administered 2020-03-12: 80 ug via INTRAVENOUS
  Administered 2020-03-12: 40 ug via INTRAVENOUS
  Administered 2020-03-12 (×6): 80 ug via INTRAVENOUS
  Administered 2020-03-12: 120 ug via INTRAVENOUS
  Administered 2020-03-12 (×4): 80 ug via INTRAVENOUS
  Administered 2020-03-12: 40 ug via INTRAVENOUS
  Administered 2020-03-12: 80 ug via INTRAVENOUS
  Administered 2020-03-12: 40 ug via INTRAVENOUS
  Administered 2020-03-12: 80 ug via INTRAVENOUS
  Administered 2020-03-12: 120 ug via INTRAVENOUS
  Administered 2020-03-12 (×3): 80 ug via INTRAVENOUS
  Administered 2020-03-12: 40 ug via INTRAVENOUS
  Administered 2020-03-12 (×3): 80 ug via INTRAVENOUS

## 2020-03-12 MED ORDER — OXYCODONE HCL 5 MG PO TABS: 5.0000 mg | ORAL_TABLET | Freq: Once | ORAL | Status: DC | PRN

## 2020-03-12 MED ORDER — MIDAZOLAM HCL 2 MG/2ML IJ SOLN
INTRAMUSCULAR | Status: AC
  Filled 2020-03-12: qty 2

## 2020-03-12 MED ORDER — TRAMADOL HCL 50 MG PO TABS
50.0000 mg | ORAL_TABLET | Freq: Four times a day (QID) | ORAL | Status: DC | PRN
  Administered 2020-03-13 – 2020-03-15 (×6): 100 mg via ORAL
  Filled 2020-03-12 (×6): qty 2

## 2020-03-12 MED ORDER — LIDOCAINE 2% (20 MG/ML) 5 ML SYRINGE
INTRAMUSCULAR | Status: DC | PRN
  Administered 2020-03-12: 60 mg via INTRAVENOUS

## 2020-03-12 MED ORDER — AMIODARONE HCL IN DEXTROSE 360-4.14 MG/200ML-% IV SOLN
60.0000 mg/h | INTRAVENOUS | Status: DC
  Administered 2020-03-12 (×2): 60 mg/h via INTRAVENOUS
  Filled 2020-03-12 (×2): qty 200

## 2020-03-12 MED ORDER — LORAZEPAM 2 MG/ML IJ SOLN: 1.0000 mg | INTRAMUSCULAR | Status: DC | PRN

## 2020-03-12 MED ORDER — HYDROMORPHONE HCL 1 MG/ML IJ SOLN: 0.2500 mg | INTRAMUSCULAR | Status: DC | PRN

## 2020-03-12 MED ORDER — MORPHINE SULFATE (PF) 2 MG/ML IV SOLN
2.0000 mg | INTRAVENOUS | Status: DC | PRN
  Administered 2020-03-13 – 2020-03-14 (×2): 2 mg via INTRAVENOUS
  Filled 2020-03-12 (×2): qty 1

## 2020-03-12 MED ORDER — METOPROLOL TARTRATE 5 MG/5ML IV SOLN
INTRAVENOUS | Status: DC | PRN
  Administered 2020-03-12 (×3): 1 mg via INTRAVENOUS
  Administered 2020-03-12: 2 mg via INTRAVENOUS

## 2020-03-12 MED ORDER — DEXAMETHASONE SODIUM PHOSPHATE 10 MG/ML IJ SOLN
INTRAMUSCULAR | Status: DC | PRN
  Administered 2020-03-12: 5 mg via INTRAVENOUS

## 2020-03-12 MED ORDER — CEFAZOLIN SODIUM 1 G IJ SOLR
INTRAMUSCULAR | Status: AC
  Filled 2020-03-12: qty 20

## 2020-03-12 MED ORDER — OXYCODONE HCL 5 MG/5ML PO SOLN: 5.0000 mg | Freq: Once | ORAL | Status: DC | PRN

## 2020-03-12 MED ORDER — FENTANYL CITRATE (PF) 250 MCG/5ML IJ SOLN
INTRAMUSCULAR | Status: DC | PRN
  Administered 2020-03-12: 25 ug via INTRAVENOUS
  Administered 2020-03-12: 100 ug via INTRAVENOUS
  Administered 2020-03-12: 25 ug via INTRAVENOUS
  Administered 2020-03-12 (×3): 50 ug via INTRAVENOUS
  Administered 2020-03-12: 100 ug via INTRAVENOUS
  Administered 2020-03-12 (×2): 50 ug via INTRAVENOUS

## 2020-03-12 MED ORDER — PHENYLEPHRINE HCL-NACL 20-0.9 MG/250ML-% IV SOLN
INTRAVENOUS | Status: DC | PRN
  Administered 2020-03-12: 80 ug/min via INTRAVENOUS

## 2020-03-12 MED ORDER — ACETAMINOPHEN 160 MG/5ML PO SOLN
1000.0000 mg | Freq: Four times a day (QID) | ORAL | Status: DC
  Filled 2020-03-12: qty 40.6

## 2020-03-12 MED ORDER — METOPROLOL TARTRATE 12.5 MG HALF TABLET
ORAL_TABLET | ORAL | Status: AC
  Filled 2020-03-12: qty 2

## 2020-03-12 MED ORDER — ACETAMINOPHEN 10 MG/ML IV SOLN
1000.0000 mg | Freq: Once | INTRAVENOUS | Status: DC | PRN
  Administered 2020-03-12: 1000 mg via INTRAVENOUS

## 2020-03-12 MED ORDER — SODIUM BICARBONATE 8.4 % IV SOLN
INTRAVENOUS | Status: AC
  Filled 2020-03-12: qty 50

## 2020-03-12 MED ORDER — BUPIVACAINE LIPOSOME 1.3 % IJ SUSP
20.0000 mL | Freq: Once | INTRAMUSCULAR | Status: DC
  Filled 2020-03-12: qty 20

## 2020-03-12 MED ORDER — ACETAMINOPHEN 10 MG/ML IV SOLN
INTRAVENOUS | Status: AC
  Filled 2020-03-12: qty 100

## 2020-03-12 MED ORDER — BUPIVACAINE HCL (PF) 0.5 % IJ SOLN
INTRAMUSCULAR | Status: AC
  Filled 2020-03-12: qty 30

## 2020-03-12 MED ORDER — ESMOLOL HCL 100 MG/10ML IV SOLN
INTRAVENOUS | Status: AC
  Filled 2020-03-12: qty 10

## 2020-03-12 MED ORDER — SODIUM CHLORIDE FLUSH 0.9 % IV SOLN
INTRAVENOUS | Status: DC | PRN
  Administered 2020-03-12: 15:00:00 100 mL

## 2020-03-12 MED ORDER — 0.9 % SODIUM CHLORIDE (POUR BTL) OPTIME
TOPICAL | Status: DC | PRN
  Administered 2020-03-12: 2000 mL

## 2020-03-12 MED ORDER — ALBUMIN HUMAN 5 % IV SOLN: INTRAVENOUS | Status: DC | PRN

## 2020-03-12 MED ORDER — MIDAZOLAM HCL 5 MG/5ML IJ SOLN
INTRAMUSCULAR | Status: DC | PRN
  Administered 2020-03-12 (×2): 1 mg via INTRAVENOUS

## 2020-03-12 MED ORDER — ROCURONIUM BROMIDE 10 MG/ML (PF) SYRINGE
PREFILLED_SYRINGE | INTRAVENOUS | Status: AC
  Filled 2020-03-12: qty 10

## 2020-03-12 MED ORDER — FENTANYL CITRATE (PF) 250 MCG/5ML IJ SOLN
INTRAMUSCULAR | Status: AC
  Filled 2020-03-12: qty 5

## 2020-03-12 MED ORDER — PROPOFOL 10 MG/ML IV BOLUS
INTRAVENOUS | Status: DC | PRN
  Administered 2020-03-12 (×2): 50 mg via INTRAVENOUS
  Administered 2020-03-12: 200 mg via INTRAVENOUS

## 2020-03-12 MED ORDER — DEXMEDETOMIDINE HCL IN NACL 400 MCG/100ML IV SOLN: 0.2000 ug/kg/h | INTRAVENOUS | Status: DC

## 2020-03-12 MED ORDER — METOPROLOL TARTRATE 50 MG PO TABS
75.0000 mg | ORAL_TABLET | Freq: Once | ORAL | Status: AC
  Administered 2020-03-12: 75 mg via ORAL

## 2020-03-12 MED ORDER — SODIUM CHLORIDE 0.9 % IV SOLN: INTRAVENOUS | Status: DC | PRN

## 2020-03-12 MED ORDER — LACTATED RINGERS IV SOLN: INTRAVENOUS | Status: DC | PRN

## 2020-03-12 MED ORDER — DILTIAZEM HCL-DEXTROSE 125-5 MG/125ML-% IV SOLN (PREMIX): 5.0000 mg/h | INTRAVENOUS | Status: DC

## 2020-03-12 MED ORDER — ONDANSETRON HCL 4 MG/2ML IJ SOLN: 4.0000 mg | Freq: Four times a day (QID) | INTRAMUSCULAR | Status: DC | PRN

## 2020-03-12 MED ORDER — ACETAMINOPHEN 500 MG PO TABS
1000.0000 mg | ORAL_TABLET | Freq: Once | ORAL | Status: AC
  Administered 2020-03-12: 1000 mg via ORAL
  Filled 2020-03-12: qty 2

## 2020-03-12 MED ORDER — THIAMINE HCL 100 MG PO TABS
100.0000 mg | ORAL_TABLET | Freq: Every day | ORAL | Status: DC
  Administered 2020-03-13 – 2020-03-16 (×4): 100 mg via ORAL
  Filled 2020-03-12 (×4): qty 1

## 2020-03-12 MED ORDER — BISACODYL 5 MG PO TBEC
10.0000 mg | DELAYED_RELEASE_TABLET | Freq: Every day | ORAL | Status: DC
  Administered 2020-03-13 – 2020-03-14 (×2): 10 mg via ORAL
  Filled 2020-03-12 (×4): qty 2

## 2020-03-12 MED ORDER — SODIUM BICARBONATE 8.4 % IV SOLN
INTRAVENOUS | Status: AC
  Filled 2020-03-12: qty 100

## 2020-03-12 MED ORDER — ONDANSETRON HCL 4 MG/2ML IJ SOLN
INTRAMUSCULAR | Status: DC | PRN
  Administered 2020-03-12: 4 mg via INTRAVENOUS

## 2020-03-12 MED ORDER — CEFAZOLIN SODIUM-DEXTROSE 2-4 GM/100ML-% IV SOLN
2.0000 g | INTRAVENOUS | Status: AC
  Administered 2020-03-12 (×2): 2 g via INTRAVENOUS
  Filled 2020-03-12: qty 100

## 2020-03-12 MED ORDER — PHENYLEPHRINE HCL-NACL 10-0.9 MG/250ML-% IV SOLN
INTRAVENOUS | Status: DC | PRN
  Administered 2020-03-12: 25 ug/min via INTRAVENOUS
  Administered 2020-03-12: 50 ug/min via INTRAVENOUS

## 2020-03-12 MED ORDER — DILTIAZEM HCL-DEXTROSE 125-5 MG/125ML-% IV SOLN (PREMIX)
INTRAVENOUS | Status: DC | PRN
  Administered 2020-03-12: 5 mg/h via INTRAVENOUS

## 2020-03-12 MED ORDER — ACETAMINOPHEN 500 MG PO TABS
1000.0000 mg | ORAL_TABLET | Freq: Four times a day (QID) | ORAL | Status: DC
  Administered 2020-03-12 – 2020-03-16 (×16): 1000 mg via ORAL
  Filled 2020-03-12 (×16): qty 2

## 2020-03-12 MED ORDER — SODIUM CHLORIDE 0.9 % IR SOLN
Status: DC | PRN
  Administered 2020-03-12: 1000 mL

## 2020-03-12 MED ORDER — CHLORHEXIDINE GLUCONATE CLOTH 2 % EX PADS
6.0000 | MEDICATED_PAD | Freq: Every day | CUTANEOUS | Status: DC
  Administered 2020-03-12 – 2020-03-13 (×2): 6 via TOPICAL

## 2020-03-12 MED ORDER — IPRATROPIUM-ALBUTEROL 0.5-2.5 (3) MG/3ML IN SOLN
3.0000 mL | Freq: Four times a day (QID) | RESPIRATORY_TRACT | Status: DC
  Administered 2020-03-12 – 2020-03-13 (×4): 3 mL via RESPIRATORY_TRACT
  Filled 2020-03-12 (×5): qty 3

## 2020-03-12 MED ORDER — ORAL CARE MOUTH RINSE
15.0000 mL | Freq: Two times a day (BID) | OROMUCOSAL | Status: DC
  Administered 2020-03-12 – 2020-03-15 (×6): 15 mL via OROMUCOSAL

## 2020-03-12 MED ORDER — SENNOSIDES-DOCUSATE SODIUM 8.6-50 MG PO TABS
1.0000 | ORAL_TABLET | Freq: Every day | ORAL | Status: DC
  Administered 2020-03-12 – 2020-03-13 (×2): 1 via ORAL
  Filled 2020-03-12 (×3): qty 1

## 2020-03-12 MED ORDER — DILTIAZEM HCL-DEXTROSE 125-5 MG/125ML-% IV SOLN (PREMIX)
5.0000 mg/h | INTRAVENOUS | Status: DC
  Filled 2020-03-12: qty 125

## 2020-03-12 MED ORDER — VASOPRESSIN 20 UNIT/ML IV SOLN
INTRAVENOUS | Status: DC | PRN
  Administered 2020-03-12: 1 [IU] via INTRAVENOUS
  Administered 2020-03-12 (×2): .5 [IU] via INTRAVENOUS
  Administered 2020-03-12: 2 [IU] via INTRAVENOUS
  Administered 2020-03-12 (×2): .5 [IU] via INTRAVENOUS

## 2020-03-12 MED ORDER — DEXAMETHASONE SODIUM PHOSPHATE 10 MG/ML IJ SOLN
INTRAMUSCULAR | Status: AC
  Filled 2020-03-12: qty 1

## 2020-03-12 MED ORDER — VASOPRESSIN 20 UNIT/ML IV SOLN
INTRAVENOUS | Status: AC
  Filled 2020-03-12: qty 1

## 2020-03-12 MED ORDER — ONDANSETRON HCL 4 MG/2ML IJ SOLN
INTRAMUSCULAR | Status: AC
  Filled 2020-03-12: qty 2

## 2020-03-12 MED ORDER — PHENYLEPHRINE 40 MCG/ML (10ML) SYRINGE FOR IV PUSH (FOR BLOOD PRESSURE SUPPORT)
PREFILLED_SYRINGE | INTRAVENOUS | Status: AC
  Filled 2020-03-12: qty 20

## 2020-03-12 MED ORDER — ADULT MULTIVITAMIN W/MINERALS CH
1.0000 | ORAL_TABLET | Freq: Every day | ORAL | Status: DC
  Administered 2020-03-13 – 2020-03-16 (×4): 1 via ORAL
  Filled 2020-03-12 (×4): qty 1

## 2020-03-12 SURGICAL SUPPLY — 95 items
ANCHOR CATH FOLEY SECURE (MISCELLANEOUS) IMPLANT
BLADE CLIPPER SURG (BLADE) ×5 IMPLANT
BLADE SURG 10 STRL SS (BLADE) ×2 IMPLANT
BLADE SURG 11 STRL SS (BLADE) ×7 IMPLANT
CANISTER SUCT 3000ML PPV (MISCELLANEOUS) ×10 IMPLANT
CANNULA REDUC XI 12-8 STAPL (CANNULA)
CANNULA REDUC XI 12-8MM STAPL (CANNULA)
CANNULA REDUCER 12-8 DVNC XI (CANNULA) IMPLANT
CATH THORACIC 28FR (CATHETERS) ×5 IMPLANT
CNTNR URN SCR LID CUP LEK RST (MISCELLANEOUS) ×3 IMPLANT
CONT SPEC 4OZ STRL OR WHT (MISCELLANEOUS) ×2
COUNTER NEEDLE 20 DBL MAG RED (NEEDLE) ×2 IMPLANT
COVER BACK TABLE 60X90IN (DRAPES) ×7 IMPLANT
DEFOGGER SCOPE WARMER CLEARIFY (MISCELLANEOUS) ×5 IMPLANT
DERMABOND ADVANCED (GAUZE/BANDAGES/DRESSINGS) ×4
DERMABOND ADVANCED .7 DNX12 (GAUZE/BANDAGES/DRESSINGS) ×6 IMPLANT
DRAIN CHANNEL 28F RND 3/8 FF (WOUND CARE) ×2 IMPLANT
DRAIN PENROSE 1/4X12 LTX STRL (WOUND CARE) ×7 IMPLANT
DRAPE CARDIOVASC SPLIT 88X140 (DRAPES) ×4 IMPLANT
DRAPE COLUMN DVNC XI (DISPOSABLE) ×12 IMPLANT
DRAPE DA VINCI XI COLUMN (DISPOSABLE) ×8
DRAPE INCISE IOBAN 66X45 STRL (DRAPES) ×2 IMPLANT
DRAPE ORTHO SPLIT 77X108 STRL (DRAPES) ×4
DRAPE SURG ORHT 6 SPLT 77X108 (DRAPES) IMPLANT
ELECT REM PT RETURN 9FT ADLT (ELECTROSURGICAL) ×5
ELECTRODE REM PT RTRN 9FT ADLT (ELECTROSURGICAL) ×3 IMPLANT
GAUZE KITTNER 4X8 (MISCELLANEOUS) ×2 IMPLANT
GAUZE SPONGE 4X4 12PLY STRL (GAUZE/BANDAGES/DRESSINGS) ×5 IMPLANT
GLOVE BIO SURGEON STRL SZ7 (GLOVE) ×5 IMPLANT
GLOVE BIO SURGEON STRL SZ7.5 (GLOVE) ×9 IMPLANT
GLOVE BIOGEL PI IND STRL 6.5 (GLOVE) IMPLANT
GLOVE BIOGEL PI INDICATOR 6.5 (GLOVE) ×2
GOWN STRL REUS W/ TWL LRG LVL3 (GOWN DISPOSABLE) ×3 IMPLANT
GOWN STRL REUS W/ TWL XL LVL3 (GOWN DISPOSABLE) ×6 IMPLANT
GOWN STRL REUS W/TWL LRG LVL3 (GOWN DISPOSABLE) ×2
GOWN STRL REUS W/TWL XL LVL3 (GOWN DISPOSABLE) ×4
GRASPER SUT TROCAR 14GX15 (MISCELLANEOUS) IMPLANT
IRRIGATOR SUCT 8 DISP DVNC XI (IRRIGATION / IRRIGATOR) IMPLANT
IRRIGATOR SUCTION 8MM XI DISP (IRRIGATION / IRRIGATOR) ×2
IV NS 1000ML (IV SOLUTION)
IV NS 1000ML BAXH (IV SOLUTION) IMPLANT
KIT BASIN OR (CUSTOM PROCEDURE TRAY) ×5 IMPLANT
KIT TURNOVER KIT B (KITS) ×5 IMPLANT
MARKER SKIN DUAL TIP RULER LAB (MISCELLANEOUS) ×5 IMPLANT
MATRIX GENTRIX 7.5X6 HIATAL (Tissue) ×2 IMPLANT
NDL 18GX1X1/2 (RX/OR ONLY) (NEEDLE) IMPLANT
NEEDLE 18GX1X1/2 (RX/OR ONLY) (NEEDLE) IMPLANT
NS IRRIG 1000ML POUR BTL (IV SOLUTION) ×10 IMPLANT
OBTURATOR OPTICAL STANDARD 8MM (TROCAR) ×2
OBTURATOR OPTICAL STND 8 DVNC (TROCAR) ×3
OBTURATOR OPTICALSTD 8 DVNC (TROCAR) IMPLANT
OIL SILICONE PENTAX (PARTS (SERVICE/REPAIRS)) IMPLANT
PACK CHEST (CUSTOM PROCEDURE TRAY) ×5 IMPLANT
PAD ARMBOARD 7.5X6 YLW CONV (MISCELLANEOUS) ×10 IMPLANT
SEAL CANN UNIV 5-8 DVNC XI (MISCELLANEOUS) ×12 IMPLANT
SEAL XI 5MM-8MM UNIVERSAL (MISCELLANEOUS) ×8
SEALER SYNCHRO 8 IS4000 DV (MISCELLANEOUS) ×2
SEALER SYNCHRO 8 IS4000 DVNC (MISCELLANEOUS) IMPLANT
SET IRRIG TUBING LAPAROSCOPIC (IRRIGATION / IRRIGATOR) IMPLANT
SET TUBE SMOKE EVAC HIGH FLOW (TUBING) ×5 IMPLANT
SHEET MEDIUM DRAPE 40X70 STRL (DRAPES) ×5 IMPLANT
SOL ANTI FOG 6CC (MISCELLANEOUS) IMPLANT
SOLUTION ANTI FOG 6CC (MISCELLANEOUS)
STAPLER CANNULA SEAL DVNC XI (STAPLE) ×6 IMPLANT
STAPLER CANNULA SEAL XI (STAPLE) ×4
STOPCOCK 4 WAY LG BORE MALE ST (IV SETS) ×5 IMPLANT
SUT ETHIBOND 0 36 GRN (SUTURE) ×8 IMPLANT
SUT SILK  1 MH (SUTURE) ×2
SUT SILK 1 MH (SUTURE) ×3 IMPLANT
SUT SURGIDAC NAB ES-9 0 48 120 (SUTURE) IMPLANT
SUT VIC AB 3-0 SH 27 (SUTURE) ×2
SUT VIC AB 3-0 SH 27X BRD (SUTURE) IMPLANT
SUT VIC AB 3-0 X1 27 (SUTURE) ×10 IMPLANT
SUT VICRYL 0 UR6 27IN ABS (SUTURE) ×10 IMPLANT
SYR 10ML LL (SYRINGE) ×5 IMPLANT
SYR 20ML ECCENTRIC (SYRINGE) ×5 IMPLANT
SYR 20ML LL LF (SYRINGE) ×2 IMPLANT
SYR 30ML SLIP (SYRINGE) ×5 IMPLANT
SYR 50ML LL SCALE MARK (SYRINGE) ×5 IMPLANT
SYSTEM SAHARA CHEST DRAIN ATS (WOUND CARE) ×5 IMPLANT
TAPE CLOTH SURG 4X10 WHT LF (GAUZE/BANDAGES/DRESSINGS) ×2 IMPLANT
TOWEL GREEN STERILE (TOWEL DISPOSABLE) ×5 IMPLANT
TOWEL GREEN STERILE FF (TOWEL DISPOSABLE) ×5 IMPLANT
TRAP SPECIMEN MUCOUS 40CC (MISCELLANEOUS) ×4 IMPLANT
TRAY FOLEY MTR SLVR 16FR STAT (SET/KITS/TRAYS/PACK) ×5 IMPLANT
TROCAR XCEL 12X100 BLDLESS (ENDOMECHANICALS) ×5 IMPLANT
TROCAR XCEL BLADELESS 5X75MML (TROCAR) IMPLANT
TROCAR XCEL NON-BLD 5MMX100MML (ENDOMECHANICALS) IMPLANT
TUBE CONNECTING 20'X1/4 (TUBING) ×2
TUBE CONNECTING 20X1/4 (TUBING) ×5 IMPLANT
TUBING ENDO SMARTCAP (MISCELLANEOUS) ×5 IMPLANT
TUBING EXTENTION W/L.L. (IV SETS) ×5 IMPLANT
TUBING LAP HI FLOW INSUFFLATIO (TUBING) ×5 IMPLANT
UNDERPAD 30X30 (UNDERPADS AND DIAPERS) ×5 IMPLANT
WATER STERILE IRR 1000ML POUR (IV SOLUTION) ×10 IMPLANT

## 2020-03-12 NOTE — Plan of Care (Signed)

## 2020-03-12 NOTE — Anesthesia Procedure Notes (Signed)
Procedure Name: Intubation Date/Time: 03/12/2020 9:09 AM Performed by: Janene Harvey, CRNA Pre-anesthesia Checklist: Patient identified, Emergency Drugs available, Suction available and Patient being monitored Patient Re-evaluated:Patient Re-evaluated prior to induction Oxygen Delivery Method: Circle system utilized Preoxygenation: Pre-oxygenation with 100% oxygen Induction Type: IV induction and Rapid sequence Laryngoscope Size: Mac and 4 Tube type: Oral Tube size: 8.0 mm Number of attempts: 1 Airway Equipment and Method: Stylet and Oral airway Placement Confirmation: ETT inserted through vocal cords under direct vision,  positive ETCO2 and breath sounds checked- equal and bilateral Secured at: 22 cm Tube secured with: Tape Dental Injury: Teeth and Oropharynx as per pre-operative assessment

## 2020-03-12 NOTE — Anesthesia Procedure Notes (Signed)
Procedure Name: Intubation Date/Time: 03/12/2020 3:51 PM Performed by: Janene Harvey, CRNA Pre-anesthesia Checklist: Patient identified, Emergency Drugs available, Suction available and Patient being monitored Patient Re-evaluated:Patient Re-evaluated prior to induction Oxygen Delivery Method: Circle system utilized Preoxygenation: Pre-oxygenation with 100% oxygen Induction Type: Inhalational induction with existing ETT Ventilation: Mask ventilation without difficulty Laryngoscope Size: Mac and 4 Grade View: Grade II Tube type: Oral Endobronchial tube: Double lumen EBT and Left and 39 Fr Number of attempts: 1 Airway Equipment and Method: Stylet and Oral airway Placement Confirmation: ETT inserted through vocal cords under direct vision,  positive ETCO2 and breath sounds checked- equal and bilateral Secured at: 30 cm Tube secured with: Tape Dental Injury: Teeth and Oropharynx as per pre-operative assessment

## 2020-03-12 NOTE — Transfer of Care (Signed)
Immediate Anesthesia Transfer of Care Note  Patient: Dean Anderson  Procedure(s) Performed: ESOPHAGOGASTRODUODENOSCOPY (EGD) (N/A ) XI ROBOTIC ASSISTED HIATAL HERNIA REPAIR USING ACELL MATRIX HIATAL (N/A Chest) VIDEO ASSISTED THORACOSCOPY (VATS)/DRAINING OF AN EMPYEMA (Right )  Patient Location: PACU  Anesthesia Type:General  Level of Consciousness: awake, alert  and oriented  Airway & Oxygen Therapy: Patient Spontanous Breathing and Patient connected to face mask oxygen  Post-op Assessment: Report given to RN and Post -op Vital signs reviewed and stable  Post vital signs: Reviewed and stable  Last Vitals:  Vitals Value Taken Time  BP 115/63 03/12/20 1707  Temp    Pulse 92 03/12/20 1710  Resp 26 03/12/20 1710  SpO2 100 % 03/12/20 1710  Vitals shown include unvalidated device data.  Last Pain:  Vitals:   03/12/20 0657  TempSrc:   PainSc: 0-No pain         Complications: No apparent anesthesia complications

## 2020-03-12 NOTE — Progress Notes (Signed)
CT surgery p.m. Rounds  Patient resting comfortably after robotic assisted paraesophageal hernia repair Minimal pain Breathing comfortably No nausea

## 2020-03-12 NOTE — Anesthesia Postprocedure Evaluation (Signed)
Anesthesia Post Note  Patient: Dean Anderson  Procedure(s) Performed: ESOPHAGOGASTRODUODENOSCOPY (EGD) (N/A ) XI ROBOTIC ASSISTED HIATAL HERNIA REPAIR USING ACELL MATRIX HIATAL (N/A Chest) VIDEO ASSISTED THORACOSCOPY (VATS)/DRAINING OF AN EMPYEMA (Right )     Patient location during evaluation: PACU Anesthesia Type: General Level of consciousness: awake Pain management: pain level controlled Vital Signs Assessment: post-procedure vital signs reviewed and stable Respiratory status: spontaneous breathing, nonlabored ventilation, respiratory function stable and patient connected to nasal cannula oxygen Cardiovascular status: blood pressure returned to baseline and stable Postop Assessment: no apparent nausea or vomiting Anesthetic complications: no    Last Vitals:  Vitals:   03/12/20 2000 03/12/20 2036  BP: 98/63   Pulse: 87   Resp: (!) 28   Temp:    SpO2: 96% 98%    Last Pain:  Vitals:   03/12/20 2000  TempSrc:   PainSc: 0-No pain                 Loyal Rudy P Jaelynn Currier

## 2020-03-12 NOTE — Op Note (Signed)
Dean Anderson       [place] [address]             [phone]       03/12/2020 Patient:  Dean Anderson Pre-Op Dx:  Paraesophageal hernia   Chronic right pleural effusion     Chronic afib Post-op Dx:  same Procedure: - Esophagoscopy - Robotic assisted laparoscopy - Paraesophageal hernia repair with A-Cell Mesh - Right VATS, drainage of pleural effusion  Surgeon and Role:      * Gerrod Maule, Lucile Crater, MD - Primary    Evonnie Pat, PA-C - assisting Anesthesia  general EBL:  150 ml Blood Administration: none Specimen:  Pleural fluid Drains: 24F blake  Counts: correct   Indications: Dean Anderson 54 y.o. male presents for surgical recurrent right-sided pleural effusion. The patient states that over the past year he has noticed some increased work of breathing and fatigue. He has had 2 L of fluid drained the most recent thoracentesis, but continues to have some shortness of breath. He was diagnosed with Covid infection in January of this year and states that he never had any symptoms. He does have a CT scan dating back to July of last year when it was noted that he had a large hiatal hernia. This is during the work-up of his GI bleed. He was placed on blood thinners but became anemic and this subsequently was stopped.  Regards to his hiatal hernia he does have a remote history of reflux but denies any significant dysphagia or odynophagia. He does state that his throat is sore in the morning he has some hoarseness. In regards to chronic cough this is mostly been over the last 6 months. On occasion he will wake in the middle the night and feel like he needs to throw up.   Findings: Congested liver.  Large hiatal hernia with stomach and omentum densely adherent to the diaphragm and right pleura.  3-4 cm of intra-abdominal esophagus obtained.    The patient in afib at the start of the case but developed a rapid ventricular rate.  He was started on a cardene drip with  little improvement.  Upon release of the pneumoperitoneum, his rate did improve.  At this point, we had just placed the mesh, so elected to not perform a wrap.  We quickly prepped him for a VATS, and noted normal lungs, and no pleural disease.  He had a blood tinge effusion that was drained, and a chest tube was place.    Operative Technique: After the risks, benefits and alternatives were thoroughly discussed, the patient was brought to the operative theatre.  Anesthesia was induced, and the esophagoscope was passed through the oropharynx down to the stomach.  The scope was retroflexed and the hiatal hernia was clearly evident.  The scope was pulled back and the mucosal surface of the esophagus was visualized.    The mucosa appeared normal.  The scope was then parked at 25 cm from the incisors.  The patient was then prepped and draped in normal sterile fashion.  An appropriate surgical pause was performed, and pre-operative antibiotics were dosed accordingly.  We began with a 1 cm incision 15 cm caudad from the xiphoid and slightly lateral to the umbilicus.  Using an Optiview we entered the peritoneal space.  The abdomen was then insufflated with CO2.  3 other robotic ports were placed to triangulate the hiatus.  Another 12 mm port was placed in place at  the level of the umbilicus laterally for an assistant port and another 5 mm trocar was placed in the right lower quadrant for liver retractor.  The patient was then placed in steep reverse Trendelenburg and the liver was elevated to expose the esophageal hiatus.  And then the robot was docked.  We began by dividing the gastrohepatic ligament to expose the right diaphragmatic crus and then dissected the hernia sac in a clockwise fashion to mobilize there the stomach and esophagus.  We then divided the short gastrics and moved towards the right crus and completed our dissection along the esophageal hiatus.  A Penrose drain was then used to encircle the the  esophagus and we continued our dissection up into the mediastinum.  Once we had achieved 3 to 4 cm of intra-abdominal esophagus we then proceeded to reapproximate the crura with 0 Ethibond sutures in an interrupted fashion.  A U shaped A-cell mesh was then sutured to the crura.  The gastroscope was passed down through the lower esophageal sphincter into the stomach and would act as our bougie during this repair.  An air leak test was performed using the gastroscope.  No leak was evident.The patient became more tachycardic at this point, and was placed on a cardene drip.  I elected to forgo the fundoplication, and move to the thoracoscopy.   The liver retractor was removed and all ports were removed under direct visualization.  The skin and soft tissue were closed with absorbable suture    The patient was then placed in a left latera decubitus position and was prepped and draped in normal sterile fashion.  An appropriate surgical pause was performed, and pre-operative antibiotics were dosed accordingly.  We began with 2cm incision in the anterior axillary line at the 5th intercostal space.  The chest was entered, and we then placed a 1cm incision at the 10th intercostal space, and introduced our camera port.  The lung was directly visualized.  There were no concerning lesions on the lung or the pleura.   The lung was then mobilized off of the chest wall.  A blood tinged effusion was drained.    An intercostal nerve block was performed under direct visualization.  A 67F chest with then placed, and we watch the lung re-expand.  The skin and soft tissue were closed with absorbable suture    The patient tolerated the procedure without any immediate complications, and was transferred to the PACU in stable condition.  The patient tolerated the procedure without any immediate complications, and was transferred to the PACU in stable condition.  Hien Perreira Keane Scrape

## 2020-03-12 NOTE — Anesthesia Procedure Notes (Signed)
Arterial Line Insertion Start/End4/06/2020 8:00 AM, 03/12/2020 8:10 AM Performed by: Audie Pinto, CRNA, CRNA  Patient location: Pre-op. Lidocaine 1% used for infiltration and patient sedated Right, radial was placed Catheter size: 20 G Hand hygiene performed  and maximum sterile barriers used  Allen's test indicative of satisfactory collateral circulation Attempts: 1 Procedure performed without using ultrasound guided technique. Following insertion, Biopatch and dressing applied. Post procedure assessment: normal  Patient tolerated the procedure well with no immediate complications.

## 2020-03-12 NOTE — H&P (Signed)
[address]       [name] [address]             [id number]        [facility] Medical Record #[id number]  Date of Birth: 01/25/1966  Referring: Nyoka Cowden, MD  Primary Care: Danelle Berry, PA-C  Primary Cardiologist: Debbe Odea, MD   No events since his clinic appointment  Vitals:   03/12/20 0836 03/12/20 0837  BP: (!) 136/107   Pulse: 64 79  Resp: (!) 23 (!) 23  Temp:    SpO2: 100% 100%    In afib this am.  EWOB of breathing  OR today for Esophagoscopy, robotic assisted hiatal hernia repair, right thoracoscopy, decortication.  Per my last clinic note    Chief Complaint:      Chief Complaint  Patient presents with  . Pleural Effusion    Surgical eval with CXR, HX of RIGHT THORACENTESIS    History of Present Illness:  Dean Anderson 54 y.o. male presents for surgical recurrent right-sided pleural effusion. The patient states that over the past year he has noticed some increased work of breathing and fatigue. He has had 2 L of fluid drained the most recent thoracentesis, but continues to have some shortness of breath. He was diagnosed with Covid infection in January of this year and states that he never had any symptoms. He does have a CT scan dating back to July of last year when it was noted that he had a large hernia. This is during the work-up of his GI bleed. He was placed on blood thinners but became anemic and this subsequently was stopped.  Regards to his hiatal hernia he does have a remote history of reflux but denies any significant dysphagia or odynophagia. He does state that his throat is sore in the morning he has some hoarseness. In regards to chronic cough this is mostly been over the last 6 months. On occasion he will wake in the middle the night and feel like he needs to throw up.  Smoking Hx:  Non-smoker  Zubrod Score:  At the time of surgery this patient's most appropriate activity status/level should be  described as:  ? 0 Normal activity, no symptoms  ? 1 Restricted in physical strenuous activity but ambulatory, able to do out light work  ? 2 Ambulatory and capable of self care, unable to do work activities, up and about >50 % of waking hours  ? 3 Only limited self care, in bed greater than 50% of waking hours  ? 4 Completely disabled, no self care, confined to bed or chair  ? 5 Moribund      Past Medical History:  Diagnosis Date  . Anemia   . Atrial fibrillation (HCC)   . GERD (gastroesophageal reflux disease)   . Hypertension   . Thyroid disease         Past Surgical History:  Procedure Laterality Date  . COLONOSCOPY WITH PROPOFOL N/A 04/17/2019   Procedure: COLONOSCOPY WITH PROPOFOL; Surgeon: Midge Minium, MD; Location: [facility] ENDOSCOPY; Service: Endoscopy; Laterality: N/A;  . ESOPHAGOGASTRODUODENOSCOPY (EGD) WITH PROPOFOL N/A 04/17/2019   Procedure: ESOPHAGOGASTRODUODENOSCOPY (EGD) WITH PROPOFOL; Surgeon: Midge Minium, MD; Location: [facility] ENDOSCOPY; Service: Endoscopy; Laterality: N/A;  . GIVENS CAPSULE STUDY  05/2019   negative        Family History  Problem Relation Age of Onset  . Hypertension Mother   . Thyroid disease Mother   . Stroke  Father 32  . Liver cancer Maternal Uncle   . Heart disease Brother   . Hypertension Brother    Social History      Tobacco Use  Smoking Status Never Smoker  Smokeless Tobacco Never Used   Social History       Substance and Sexual Activity  Alcohol Use Yes  . Alcohol/week: 70.0 standard drinks  . Types: 70 Cans of beer per week   Comment: 6 beers a day   No Known Allergies        Current Outpatient Medications  Medication Sig Dispense Refill  . albuterol (VENTOLIN HFA) 108 (90 Base) MCG/ACT inhaler Inhale 2 puffs into the lungs every 6 (six) hours as needed for wheezing or shortness of breath.    . fluticasone (FLONASE) 50 MCG/ACT nasal spray Place 2 sprays into both nostrils as needed for allergies or rhinitis. (Patient not  taking: Reported on 02/19/2020) 16 g 5  . furosemide (LASIX) 20 MG tablet Take 1 tablet (20 mg total) by mouth daily. Take with potassium 30 tablet 3  . hydrALAZINE (APRESOLINE) 25 MG tablet Take 1-2 tablets (25-50 mg total) by mouth 3 (three) times daily as needed. Take for BP >150/90 90 tablet 2  . levothyroxine (SYNTHROID) 88 MCG tablet Take 1 tablet (88 mcg total) by mouth daily before breakfast. 90 tablet 1  . lisinopril (ZESTRIL) 40 MG tablet Take 1 tablet (40 mg total) by mouth daily. 90 tablet 3  . omeprazole (PRILOSEC) 20 MG capsule Take 1 capsule (20 mg total) by mouth at bedtime. 90 capsule 1  . potassium chloride SA (KLOR-CON) 20 MEQ tablet Take 1 tablet (20 mEq total) by mouth daily. 30 tablet 3   No current facility-administered medications for this visit.   Review of Systems  Constitutional: Positive for malaise/fatigue. Negative for chills and fever.  Respiratory: Positive for cough and shortness of breath.  Cardiovascular: Negative.  Gastrointestinal: Positive for heartburn and vomiting. Negative for abdominal pain and nausea.  Musculoskeletal: Negative.  Neurological: Negative.   PHYSICAL EXAMINATION:  BP 123/74  Pulse 88  Temp 98.2 F (36.8 C) (Skin)  Resp 20  Ht 5\' 8"  (1.727 m)  Wt 211 lb (95.7 kg)  SpO2 97% Comment: RA  BMI 32.08 kg/m  Physical Exam  Constitutional: He is oriented to person, place, and time. He appears well-developed and well-nourished. No distress.  HENT:  Head: Normocephalic and atraumatic.  Eyes: Conjunctivae and EOM are normal.  Neck: No tracheal deviation present.  Cardiovascular: Normal rate and normal heart sounds.  No murmur heard. Irregular rhythm  Respiratory: Effort normal. No respiratory distress.  Decreased along the right base  GI: He exhibits no distension.  Musculoskeletal:  General: Normal range of motion.  Cervical back: Normal range of motion.  Neurological: He is oriented to person, place, and time.  Skin: Skin is  warm and dry. He is not diaphoretic.   Diagnostic Studies & Laboratory data:  Recent Radiology Findings:  Imaging Results    I have independently reviewed the above radiology studies and reviewed the findings with the patient.  Recent Lab Findings:  Recent Labs  Problem List:  Recurrent right effusion  Large hiatal hernia  Hx of corona virus infection in Jan of this year.  Atrial fibrillation  History of GI bleed  Assessment / Plan:  54 yo male with large recurrent right effusion with likley intrapment of the right lower, and middle lobes. He also has a large hiatal hernia. It is possible that he may be aspirating, from the hiatal hernia, and developed a pneumonia secondary to this. It is also possible, that this may be related to his previous Covid infection. Endoscopy done last year which showed long segment Barrett's but no evidence of Cameron's ulcers. His colonoscopy and capsule endoscopy negative for sources of bleeding. Given the possibility that this pneumonia could be secondary to aspiration from his hiatal hernia I think that it needs Perative that he addressed this time. He recently saw Dr. Everlene Farrier for treatment of his hiatal hernia and I will discuss this further with him. He is agreeable to proceed with a robotic assisted paraesophageal hernia repair with fundoplication, right video-assisted thoracoscopy and decortication. He is currently not on any blood thinners that need to be bridged. In regards to his atrial fibrillation, he is already had a nuclear medicine study last month which was negative for ischemic disease.   Buck Mcaffee Keane Scrape

## 2020-03-12 NOTE — Brief Op Note (Signed)
03/12/2020  3:42 PM  PATIENT:  Dean Anderson  54 y.o. male  PRE-OPERATIVE DIAGNOSIS:  PLEURAL EFFUSION HIATAL HERNIA  POST-OPERATIVE DIAGNOSIS:  PLEURAL EFFUSION HIATAL HERNIA  PROCEDURE:  Procedure(s): ESOPHAGOGASTRODUODENOSCOPY (EGD) (N/A) XI ROBOTIC ASSISTED HIATAL HERNIA REPAIR USING ACELL MATRIX HIATAL (N/A) VIDEO ASSISTED THORACOSCOPY (VATS)/DECORTICATION (Right)  SURGEON:  Surgeon(s) and Role:    * Lightfoot, Eliezer Lofts, MD - Primary  PHYSICIAN ASSISTANT: Maie Kesinger PA-C, TESSA CONTE PA-C  ANESTHESIA:   general  EBL:  300 mL   BLOOD ADMINISTERED:none  DRAINS: CHEST TUBE IN RIGHT HEMITHORAX   LOCAL MEDICATIONS USED:  EXPAREL  SPECIMEN:  PLEURAL FLUID  DISPOSITION OF SPECIMEN:  PATHOLOGY  COUNTS:  YES  TOURNIQUET:  * No tourniquets in log *  DICTATION: .Dragon Dictation  PLAN OF CARE: Admit to inpatient   PATIENT DISPOSITION:  ICU - extubated and stable.   Delay start of Pharmacological VTE agent (>24hrs) due to surgical blood loss or risk of bleeding: yes  COMPLICATIONS: NO KNOWN

## 2020-03-13 ENCOUNTER — Telehealth: Payer: Self-pay | Admitting: Family Medicine

## 2020-03-13 ENCOUNTER — Encounter: Payer: Self-pay | Admitting: *Deleted

## 2020-03-13 ENCOUNTER — Inpatient Hospital Stay (HOSPITAL_COMMUNITY): Payer: Medicaid Other

## 2020-03-13 ENCOUNTER — Other Ambulatory Visit: Payer: Medicaid Other

## 2020-03-13 DIAGNOSIS — I4819 Other persistent atrial fibrillation: Secondary | ICD-10-CM

## 2020-03-13 LAB — POCT I-STAT 7, (LYTES, BLD GAS, ICA,H+H)
Acid-Base Excess: 2 mmol/L (ref 0.0–2.0)
Bicarbonate: 24.7 mmol/L (ref 20.0–28.0)
Calcium, Ion: 1.03 mmol/L — ABNORMAL LOW (ref 1.15–1.40)
HCT: 38 % — ABNORMAL LOW (ref 39.0–52.0)
Hemoglobin: 12.9 g/dL — ABNORMAL LOW (ref 13.0–17.0)
O2 Saturation: 99 %
Patient temperature: 97.8
Potassium: 4.5 mmol/L (ref 3.5–5.1)
Sodium: 135 mmol/L (ref 135–145)
TCO2: 26 mmol/L (ref 22–32)
pCO2 arterial: 32.2 mmHg (ref 32.0–48.0)
pH, Arterial: 7.492 — ABNORMAL HIGH (ref 7.350–7.450)
pO2, Arterial: 133 mmHg — ABNORMAL HIGH (ref 83.0–108.0)

## 2020-03-13 LAB — BASIC METABOLIC PANEL
Anion gap: 12 (ref 5–15)
BUN: 17 mg/dL (ref 6–20)
CO2: 23 mmol/L (ref 22–32)
Calcium: 7.9 mg/dL — ABNORMAL LOW (ref 8.9–10.3)
Chloride: 100 mmol/L (ref 98–111)
Creatinine, Ser: 1.43 mg/dL — ABNORMAL HIGH (ref 0.61–1.24)
GFR calc Af Amer: >60 mL/min (ref >60)
GFR calc non Af Amer: 56 mL/min — ABNORMAL LOW (ref >60)
Glucose, Bld: 141 mg/dL — ABNORMAL HIGH (ref 70–99)
Potassium: 4.8 mmol/L (ref 3.5–5.1)
Sodium: 135 mmol/L (ref 135–145)

## 2020-03-13 LAB — CBC
HCT: 41.1 % (ref 39.0–52.0)
Hemoglobin: 13.5 g/dL (ref 13.0–17.0)
MCH: 29.2 pg (ref 26.0–34.0)
MCHC: 32.8 g/dL (ref 30.0–36.0)
MCV: 88.8 fL (ref 80.0–100.0)
Platelets: 185 10*3/uL (ref 150–400)
RBC: 4.63 MIL/uL (ref 4.22–5.81)
RDW: 13.5 % (ref 11.5–15.5)
WBC: 9 10*3/uL (ref 4.0–10.5)
nRBC: 0 % (ref 0.0–0.2)

## 2020-03-13 LAB — GLUCOSE, CAPILLARY: Glucose-Capillary: 113 mg/dL — ABNORMAL HIGH (ref 70–99)

## 2020-03-13 MED ORDER — PANTOPRAZOLE SODIUM 40 MG PO TBEC
40.0000 mg | DELAYED_RELEASE_TABLET | Freq: Every day | ORAL | Status: DC
  Administered 2020-03-13 – 2020-03-16 (×4): 40 mg via ORAL
  Filled 2020-03-13 (×4): qty 1

## 2020-03-13 MED ORDER — AMIODARONE IV BOLUS ONLY 150 MG/100ML
150.0000 mg | Freq: Once | INTRAVENOUS | Status: AC
  Administered 2020-03-13: 150 mg via INTRAVENOUS

## 2020-03-13 MED ORDER — METOPROLOL TARTRATE 25 MG PO TABS
25.0000 mg | ORAL_TABLET | Freq: Two times a day (BID) | ORAL | Status: DC
  Administered 2020-03-13 – 2020-03-16 (×6): 25 mg via ORAL
  Filled 2020-03-13 (×6): qty 1

## 2020-03-13 MED ORDER — LEVALBUTEROL HCL 0.63 MG/3ML IN NEBU
0.6300 mg | INHALATION_SOLUTION | Freq: Three times a day (TID) | RESPIRATORY_TRACT | Status: DC
  Administered 2020-03-14 – 2020-03-15 (×5): 0.63 mg via RESPIRATORY_TRACT
  Filled 2020-03-13 (×5): qty 3

## 2020-03-13 MED ORDER — FLUTICASONE PROPIONATE 50 MCG/ACT NA SUSP
2.0000 | Freq: Every day | NASAL | Status: DC | PRN
  Filled 2020-03-13: qty 16

## 2020-03-13 MED ORDER — LEVOTHYROXINE SODIUM 88 MCG PO TABS
88.0000 ug | ORAL_TABLET | Freq: Every day | ORAL | Status: DC
  Administered 2020-03-14 – 2020-03-16 (×3): 88 ug via ORAL
  Filled 2020-03-13 (×3): qty 1

## 2020-03-13 NOTE — Discharge Instructions (Signed)
Thoracoscopy, Care After This sheet gives you information about how to care for yourself after your procedure. Your health care provider may also give you more specific instructions. If you have problems or questions, contact your health care provider. What can I expect after the procedure? After the procedure, it is common to have pain and soreness in the surgical area. Follow these instructions at home: Incision care   Follow instructions from your health care provider about how to take care of your incision. Make sure you: ? Wash your hands with soap and water before you change your bandage (dressing). If soap and water are not available, use hand sanitizer. ? Change your dressing as told by your health care provider. ? Leave stitches (sutures), skin glue, or adhesive strips in place. These skin closures may need to stay in place for 2 weeks or longer. If adhesive strip edges start to loosen and curl up, you may trim the loose edges. Do not remove adhesive strips completely unless your health care provider tells you to do that.  Check your incision areas every day for signs of infection. Check for: ? Redness, swelling, or pain. ? Fluid or blood. ? Warmth. ? Pus or a bad smell.  Do not take baths, swim, or use a hot tub until your health care provider approves. You may take showers. Medicines  Take over-the-counter and prescription medicines only as told by your health care provider.  If you were prescribed an antibiotic medicine, take it as told by your health care provider. Do not stop taking the antibiotic even if you start to feel better.  Do not drive or use heavy machinery while taking prescription pain medicine.  If you are taking prescription pain medicine, take actions to prevent or treat constipation. Your health care provider may recommend that you: ? Drink enough fluid to keep your urine pale yellow. ? Eat foods that are high in fiber, such as fresh fruits and vegetables,  whole grains, and beans. ? Limit foods that are high in fat and processed sugars, such as fried and sweet foods. ? Take an over-the-counter or prescription medicine for constipation. Managing pain, stiffness, and swelling   If directed, put ice on the affected area: ? Put ice in a plastic bag. ? Place a towel between your skin and the bag. ? Leave the ice on for 20 minutes, 2-3 times a day. Preventing lung infection  To prevent pneumonia and to keep your lungs healthy: ? Try to cough often. If it hurts to cough, hold a pillow against your chest as you cough. ? Take deep breaths or do breathing exercises as instructed by your health care provider. ? If you were given an incentive spirometer, use it as directed by your health care provider. General instructions  Do not lift anything that is heavier than 10 lb (4.5 kg), or the limit that you are told, until your health care provider says that it is safe.  Do not use any products that contain nicotine or tobacco, such as cigarettes and e-cigarettes. These can delay healing after surgery. If you need help quitting, ask your health care provider.  Avoid driving until your health care provider approves.  If you have a chest drainage tube, care for it as instructed by your health care provider. Do not travel by airplane after the chest drainage tube is removed until your health care provider approves.  Keep all follow-up visits as told by your health care provider. This is important. Contact   a health care provider if:  You have a fever.  Pain medicines do not ease your pain.  You have redness, swelling, or increasing pain in your incision area.  You develop a cough that does not go away, or you are coughing up mucus that is yellow or green. Get help right away if:  You have fluid, blood, or pus coming from your incision.  There is a bad smell coming from your incision or dressing.  You develop a rash.  You cough up blood.  You  develop light-headedness, or you feel faint.  You have difficulty breathing.  You develop chest pain.  Your heartbeat feels irregular or very fast. These symptoms may represent a serious problem that is an emergency. Do not wait to see if the symptoms will go away. Get medical help right away. Call your local emergency services (911 in the [place] Do not drive yourself to the hospital. Summary  Follow instructions from your health care provider about how to take care of your incision.  Do not drive or use heavy machinery while taking prescription pain medicine.  Leave stitches (sutures), skin glue, or adhesive strips in place.  Check your incision areas every day for signs of infection. This information is not intended to replace advice given to you by your health care provider. Make sure you discuss any questions you have with your health care provider. Document Revised: 11/04/2017 Document Reviewed: 11/01/2017 Elsevier Patient Education  2020 Elsevier Inc[employer]  

## 2020-03-13 NOTE — Discharge Summary (Signed)
Physician Discharge Summary  Patient ID: Dean Anderson MRN: [id number] DOB/AGE: 52-May-1967 42 y.o.  Admit date: 03/12/2020 Discharge date: 03/16/2020  Admission Diagnoses:   Paraesophageal hernia  Discharge Diagnoses:   History of repair of hiatal hernia Alcohol abuse Dean Anderson cirrhosis Right pleural effusion Iron dficiency anemia Hypertension Atrial fibrillation Acute gastritis   History of Present Illness:  Dean Anderson 54 y.o. male presents for surgical recurrent right-sided pleural effusion. The patient states that over the past year he has noticed some increased work of breathing and fatigue. He has had 2 L of fluid drained the most recent thoracentesis, but continues to have some shortness of breath. He was diagnosed with Covid infection in January of this year and states that he never had any symptoms. He does have a CT scan dating back to July of last year when it was noted that he had a large hernia. This is during the work-up of his GI bleed. He was placed on blood thinners but became anemic and this subsequently was stopped.  Regards to his hiatal hernia he does have a remote history of reflux but denies any significant dysphagia or odynophagia. He does state that his throat is sore in the morning he has some hoarseness. In regards to chronic cough this is mostly been over the last 6 months. On occasion he will wake in the middle the night and feel like he needs to throw up.  Due to the progressive nature of his symptoms increasing severity he was evaluated by Dr. Cliffton Asters and felt to be a good candidate for robotic repair.  He was admitted this hospitalization for the procedure.   Discharged Condition: good  Hospital Course: The patient was admitted electively and taken the operating room where he underwent the below described procedure.  He tolerated it well was taken to the surgical ICU in stable condition.  He did have some difficulty intraoperatively with blood pressure  control and rate control of his atrial fibrillation.  A cardiology consultation has been obtained.  He initially was placed on Cardizem but was transitioned to IV amiodarone and later oral amiodarone.  Beta-blocker has also been added for rate control.  Tentative plan is not to add DOAC until 2 weeks postsurgical recovery.  An esophogram was performed on the first post-op day and demonstrated appropriate function and no evidence of esophageal leak.  Chest tube drainage was observed to moderate to for the 12 hours prior to their removal on 03/16/20. F/U CXR showed no PTX.  Hemoglobin and hematocrit are stable with most recent values of 13 and 43%.  He has had some acute renal insufficiency during the postoperative period.  He was initially placed on Toradol for pain control but this has been discontinued.  His creat normalized to 1.09 by the day of discharge. He was noted to have elevated LFT's c/w Dean Anderson cirrhosis and Dr. Cliffton Asters felt this hepatic dysfunction was contributing to the pleural effusion (pleural ascites). He was tolerating a full liquid diet at the time of discharge and was instructed to continue this at home until cleared by Dr. Cliffton Asters to advance his diet. He was independent with mobility. His incisions were intact and healing with no signs of complication.     Consults: cardiology  Significant Diagnostic Studies:   EXAM: ESOPHOGRAM/BARIUM SWALLOW  TECHNIQUE: Single contrast examination was performed using water-soluble contrast.  FLUOROSCOPY TIME:  Fluoroscopy Time:  48 seconds  Radiation Exposure Index (if provided by the fluoroscopic device): 42.8 mGy  Number of Acquired Spot Images: 8  COMPARISON:  None.  FINDINGS: Esophagus is normal in course and in caliber. Gastroesophageal junction is patent with contrast entering the stomach. There is no extravasation of contrast to indicate a postoperative leak. No reflux was noted during the study. No hiatal  hernia.  IMPRESSION: 1. Repaired hiatal hernia. No evidence of a postoperative leak. No reflux or significant narrowing at the gastroesophageal junction.   Electronically Signed   By: Lajean Manes M.D.   On: 03/13/2020 08:49  Treatments: surgery: 03/12/2020 Patient:  Dean Anderson Pre-Op Dx:     Paraesophageal hernia                         Chronic right pleural effusion                           Chronic afib Post-op Dx:  same Procedure: - Esophagoscopy - Robotic assisted laparoscopy - Paraesophageal hernia repair with A-Cell Mesh - Right VATS, drainage of pleural effusion  Surgeon and Role:      * Lightfoot, Lucile Crater, MD - Primary    Evonnie Pat, PA-C - assisting Anesthesia  general   Discharge Exam: Blood pressure (!) 134/111, pulse 93, temperature 97.9 F (36.6 C), temperature source Oral, resp. rate (!) 22, height 5\' 8"  (1.727 m), weight 97.8 kg, SpO2 99 %.  General appearance:alert, cooperative and no distress Neurologic:intact Heart:irregularly irregular rhythm. Monitor shows atrial fibrillation with controlled rate Lungs:breath sounds are clear, CT drainage decreased to 115ml past 12 hours. Drainage is thin, serous fluid. CXR stable.  Abdomen:moderate distension is unchanged, active bowel sounds Wound:The chest incisions are intact and dry.  Disposition:    Allergies as of 03/16/2020   No Known Allergies     Medication List    STOP taking these medications   potassium chloride SA 20 MEQ tablet Commonly known as: KLOR-CON     TAKE these medications   albuterol 108 (90 Base) MCG/ACT inhaler Commonly known as: VENTOLIN HFA Inhale 2 puffs into the lungs every 6 (six) hours as needed for wheezing or shortness of breath.   amiodarone 200 MG tablet Commonly known as: PACERONE Take 1 tablet (200 mg total) by mouth 2 (two) times daily.   fluticasone 50 MCG/ACT nasal spray Commonly known as: FLONASE Place 2 sprays into both nostrils as needed for  allergies or rhinitis. What changed: when to take this   folic acid 1 MG tablet Commonly known as: FOLVITE Take 1 tablet (1 mg total) by mouth daily. Start taking on: March 17, 2020   furosemide 20 MG tablet Commonly known as: LASIX Take 1 tablet (20 mg total) by mouth daily. Take with potassium   hydrALAZINE 25 MG tablet Commonly known as: APRESOLINE Take 1-2 tablets (25-50 mg total) by mouth 3 (three) times daily as needed. Take for BP >150/90   ibuprofen 200 MG tablet Commonly known as: ADVIL Take 400 mg by mouth every 8 (eight) hours as needed (for pain.).   levothyroxine 88 MCG tablet Commonly known as: SYNTHROID Take 1 tablet (88 mcg total) by mouth daily before breakfast.   lisinopril 40 MG tablet Commonly known as: ZESTRIL Take 1 tablet (40 mg total) by mouth daily.   metoprolol tartrate 25 MG tablet Commonly known as: LOPRESSOR Take 100 mg by mouth 2 (two) times daily.   multivitamin with minerals Tabs tablet Take 1 tablet by mouth daily. Start  taking on: March 17, 2020   omeprazole 20 MG capsule Commonly known as: PRILOSEC Take 1 capsule (20 mg total) by mouth at bedtime.   pneumococcal 23 valent vaccine 25 MCG/0.5ML injection Commonly known as: PNEUMOVAX-23 Inject 0.5 mLs into the muscle once for 1 dose.   spironolactone 25 MG tablet Commonly known as: ALDACTONE Take 1 tablet (25 mg total) by mouth daily. Start taking on: March 17, 2020   traMADol 50 MG tablet Commonly known as: ULTRAM Take 1-2 tablets (50-100 mg total) by mouth every 6 (six) hours as needed for up to 7 days for moderate pain (mild pain).      Follow-up Information    Lightfoot, Eliezer Lofts, MD Follow up.   Specialty: Cardiothoracic Surgery Why: you have a follow up appointment with Dr. Cliffton Asters on Friday, 03/21/20 at 11am.  Please arrive 30 minutes early for a chest x-ray to be performed by [facility] located on the first floor of the same building.  Contact  information: [address] [place] [address] [phone]        Debbe Odea, MD. Go on 03/21/2020.   Specialties: Cardiology, Radiology Why: You have a cardiology follow up appointment on Friday, 03/21/20 at 4pm. Contact information: [address] [place] [address] [phone]        Danelle Berry, PA-C. Go on 03/26/2020.   Specialty: Family Medicine Why: You have an appointment with your primary care provider on Wednesday, 03/26/20 at 8:40am.  Contact information: [address] [place] [address] [phone]         In addition, we will arrange for GI follow up for management of his cirrhosis.  Signed: Leary Roca PA-C 03/16/2020, 12:27 PM

## 2020-03-13 NOTE — Consult Note (Signed)
Cardiology Consultation:   Patient ID: Dean Anderson; [id number]; 1966/10/02   Admit date: 03/12/2020 Date of Consult: 03/13/2020  Primary Care Provider: Danelle Berry, PA-C Primary Cardiologist: Debbe Odea, MD 02/01/2020 Primary Electrophysiologist:  None   Patient Profile:   Dean Anderson is a 54 y.o. male with a hx of PAF, HTN, hypothyroid, GERD, COVID 12/28/2019, who is being seen today for the evaluation of Atrial fib at the request of Dr Cliffton Asters.  History of Present Illness:   Dean Anderson was seen by Dr Azucena Cecil on 02/26. He had a large pleural effusion and was in atrial fib, rate ok on Lopressor 75 mg bid. MV ordered because of the effusion>>no scar or ischemia, EF 73%. No anticoagulation due to hx GIB January 2020.  His pleural effusion was drained, 2L removed, but the DOE continued. Concern for aspiration from the hiatal hernia. Seen by Dr Cliffton Asters who recommended robotic assisted paraesophageal hernia repair with fundoplication, right video-assisted thoracoscopy and decortication. He was admitted for the procedure on 04/07.   Post-procedure, his BP was low, A. fib in the 120s or higher. Amio gtt started for rate-control of the Afib, cards asked to see.   Dean Anderson is aware of the atrial fibrillation.  He denies any history of syncope or presyncope, but he feels the irregular heartbeat all the time.  He was first diagnosed in 2019, but was in sinus rhythm when he saw Dr. Gwen Pounds in October 2019.  He was back in atrial fibrillation 01/17/2019.  Dr. Gwen Pounds started him on amiodarone January 29, 2019.  He was started on Eliquis and scheduled for cardioversion.  However, a subsequent CBC showed a hemoglobin of 8.7, microcytic and hypochromic anemia.  No known source of bleeding, no symptoms of GI bleed such as melena or hematemesis.  The Eliquis was discontinued and he was to follow-up with PCP for further anemia work-up.  The amiodarone was discontinued because of thyroid  dysfunction, TSH of 12.  The patient feels the A. fib has been continuous since then.  He is compliant with metoprolol 75 mg twice daily.  At home, his heart rate normally runs in the 80s to 90s.  Except when his heart rate was not controlled (prior to up titration of medications), he has no history of presyncope or syncope.  He had a GI bleed in January 2020.  They did not find a source of bleeding on EGD or colonoscopy.  He was told by the GI doctor at the time that the bleeding was from his hiatal hernia.  He has never been anticoagulated for the A. Fib.  He does not feel too bad after the surgery, feels he is doing pretty well for postop day #1.  He has decent pain control, denies shortness of breath, no chest pain.    Past Medical History:  Diagnosis Date  . Anemia   . Atrial fibrillation (HCC)   . Dyspnea   . Family history of adverse reaction to anesthesia    brother woke up in a rage after geting anesthesia for teeth surgery  . GERD (gastroesophageal reflux disease)   . History of hiatal hernia   . Hypertension   . Hypothyroidism   . Thyroid disease     Past Surgical History:  Procedure Laterality Date  . COLONOSCOPY WITH PROPOFOL N/A 04/17/2019   Procedure: COLONOSCOPY WITH PROPOFOL;  Surgeon: Midge Minium, MD;  Location: [facility] ENDOSCOPY;  Service: Endoscopy;  Laterality: N/A;  . ESOPHAGOGASTRODUODENOSCOPY N/A 03/12/2020   Procedure: ESOPHAGOGASTRODUODENOSCOPY (EGD);  Surgeon: Corliss SkainsLightfoot, Harrell O, MD;  [facility];  Service: Thoracic;  Laterality: N/A;  . ESOPHAGOGASTRODUODENOSCOPY (EGD) WITH PROPOFOL N/A 04/17/2019   Procedure: ESOPHAGOGASTRODUODENOSCOPY (EGD) WITH PROPOFOL;  Surgeon: Midge MiniumWohl, Darren, MD;  Location: [facility] ENDOSCOPY;  Service: Endoscopy;  Laterality: N/A;  . GIVENS CAPSULE STUDY  05/2019   negative  . VIDEO ASSISTED THORACOSCOPY (VATS)/DECORTICATION Right 03/12/2020   Procedure: VIDEO ASSISTED THORACOSCOPY (VATS)/DRAINING OF AN EMPYEMA;  Surgeon: Corliss SkainsLightfoot,  Harrell O, MD;  Location: [facility] OR;  Service: Thoracic;  Laterality: Right;  . XI ROBOTIC ASSISTED HIATAL HERNIA REPAIR N/A 03/12/2020   Procedure: XI ROBOTIC ASSISTED HIATAL HERNIA REPAIR USING ACELL MATRIX HIATAL;  Surgeon: Corliss SkainsLightfoot, Harrell O, MD;  Location: [facility] OR;  Service: Thoracic;  Laterality: N/A;     Prior to Admission medications   Medication Sig Start Date End Date Taking? Authorizing Provider  albuterol (VENTOLIN HFA) 108 (90 Base) MCG/ACT inhaler Inhale 2 puffs into the lungs every 6 (six) hours as needed for wheezing or shortness of breath.   Yes [provider]  fluticasone (FLONASE) 50 MCG/ACT nasal spray Place 2 sprays into both nostrils as needed for allergies or rhinitis. Patient taking differently: Place 2 sprays into both nostrils daily as needed for allergies or rhinitis.  02/18/20  Yes Danelle Berryapia, Leisa, PA-C  furosemide (LASIX) 20 MG tablet Take 1 tablet (20 mg total) by mouth daily. Take with potassium 02/18/20  Yes Danelle Berryapia, Leisa, PA-C  hydrALAZINE (APRESOLINE) 25 MG tablet Take 1-2 tablets (25-50 mg total) by mouth 3 (three) times daily as needed. Take for BP >150/90 02/18/20  Yes Danelle Berryapia, Leisa, PA-C  ibuprofen (ADVIL) 200 MG tablet Take 400 mg by mouth every 8 (eight) hours as needed (for pain.).   Yes [provider]  levothyroxine (SYNTHROID) 88 MCG tablet Take 1 tablet (88 mcg total) by mouth daily before breakfast. 01/01/20  Yes Angelica Chessmanapia, Leisa, PA-C  lisinopril (ZESTRIL) 40 MG tablet Take 1 tablet (40 mg total) by mouth daily. 02/18/20  Yes Danelle Berryapia, Leisa, PA-C  metoprolol tartrate (LOPRESSOR) 25 MG tablet Take 100 mg by mouth 2 (two) times daily.   Yes [provider]  omeprazole (PRILOSEC) 20 MG capsule Take 1 capsule (20 mg total) by mouth at bedtime. 12/27/19  Yes Danelle Berryapia, Leisa, PA-C  potassium chloride SA (KLOR-CON) 20 MEQ tablet Take 1 tablet (20 mEq total) by mouth daily. Patient taking differently: Take 20 mEq by mouth daily as needed (labs results).   01/29/20  Yes Danelle Berryapia, Leisa, PA-C    Inpatient Medications: Scheduled Meds: . acetaminophen  1,000 mg Oral Q6H   Or  . acetaminophen (TYLENOL) oral liquid 160 mg/5 mL  1,000 mg Oral Q6H  . bisacodyl  10 mg Oral Daily  . Chlorhexidine Gluconate Cloth  6 each Topical Daily  . folic acid  1 mg Oral Daily  . ipratropium-albuterol  3 mL Nebulization Q6H  . mouth rinse  15 mL Mouth Rinse BID  . multivitamin with minerals  1 tablet Oral Daily  . senna-docusate  1 tablet Oral QHS  . thiamine  100 mg Oral Daily   Continuous Infusions: . sodium chloride    . amiodarone 30 mg/hr (03/13/20 1154)  . dexmedetomidine (PRECEDEX) IV infusion    . diltiazem (CARDIZEM) infusion    . vasopressin (PITRESSIN) infusion - *FOR SHOCK*     PRN Meds: Place/Maintain arterial line **AND** sodium chloride, LORazepam, morphine injection, ondansetron (ZOFRAN) IV, pneumococcal 23 valent vaccine, traMADol  Allergies:   No Known Allergies  Social History:   Social History   Socioeconomic History  . Marital status: Married    Spouse name: Burnedette  . Number of children: Not on file  . Years of education: 64  . Highest education level: Some college, no degree  Occupational History  . Not on file  Tobacco Use  . Smoking status: Never Smoker  . Smokeless tobacco: Never Used  Substance and Sexual Activity  . Alcohol use: Yes    Alcohol/week: 70.0 standard drinks    Types: 70 Cans of beer per week    Comment: 3-4  beers a day  . Drug use: Yes    Types: Marijuana    Comment: gummy, last week  . Sexual activity: Yes  Other Topics Concern  . Not on file  Social History Narrative   alcohol use.  Lives with his wife at home.   No lung dx hx, grew up with second hand smoke, wife smokes out of home   Social Determinants of Health   Financial Resource Strain:   . Difficulty of Paying Living Expenses:   Food Insecurity:   . Worried About Programme researcher, broadcasting/film/video in the Last Year:   . Barista in the  Last Year:   Transportation Needs:   . Freight forwarder (Medical):   Marland Kitchen Lack of Transportation (Non-Medical):   Physical Activity:   . Days of Exercise per Week:   . Minutes of Exercise per Session:   Stress:   . Feeling of Stress :   Social Connections:   . Frequency of Communication with Friends and Family:   . Frequency of Social Gatherings with Friends and Family:   . Attends Religious Services:   . Active Member of Clubs or Organizations:   . Attends Banker Meetings:   Marland Kitchen Marital Status:   Intimate Partner Violence:   . Fear of Current or Ex-Partner:   . Emotionally Abused:   Marland Kitchen Physically Abused:   . Sexually Abused:     Family History:   Family History  Problem Relation Age of Onset  . Hypertension Mother   . Thyroid disease Mother   . Stroke Father 70  . Liver cancer Maternal Uncle   . Heart disease Brother   . Hypertension Brother    Family Status:  Family Status  Relation Name Status  . Mother  Alive  . Father  Alive  . Mat Uncle  Deceased  . Brother 3 Alive  . MGM  Alive  . MGF  Deceased  . PGM  Deceased  . PGF  Deceased    ROS:  Please see the history of present illness.  All other ROS reviewed and negative.     Physical Exam/Data:   Vitals:   03/13/20 0900 03/13/20 1100 03/13/20 1124 03/13/20 1200  BP: 102/74 99/86  96/77  Pulse: 89 (!) 55  78  Resp: (!) 24 (!) 21  (!) 32  Temp:   98 F (36.7 C)   TempSrc:      SpO2: 98% 96%  97%  Weight:      Height:        Intake/Output Summary (Last 24 hours) at 03/13/2020 1214 Last data filed at 03/13/2020 1100 Gross per 24 hour  Intake 4970.22 ml  Output 2875 ml  Net 2095.22 ml    Last 3 Weights 03/13/2020 03/12/2020 03/10/2020  Weight (lbs) 215 lb 9.8 oz 213 lb 13.5 oz 213 lb 14.4 oz  Weight (kg) 97.8 kg  97 kg 97.024 kg     Body mass index is 32.78 kg/m.   General:  Well nourished, well developed, male in no acute distress HEENT: normal Lymph: no adenopathy Neck: JVD -not seen  elevated but difficult to assess secondary to body habitus Endocrine:  No thryomegaly Vascular: No carotid bruits; 4/4 extremity pulses 2+  Cardiac:  normal S1, S2; rapid and irregular rate and rhythm; no murmur Lungs: Decreased breath sounds bases, right greater than left with some rales, no wheezing, rhonchi    Abd: soft, nontender, no hepatomegaly  Ext: no edema Musculoskeletal:  No deformities, BUE and BLE strength weak but equal Skin: warm and dry  Neuro:  CNs 2-12 intact, no focal abnormalities noted Psych:  Normal affect   EKG:  The EKG was personally reviewed and demonstrates:   4/5 ECG is atrial fibrillation, heart rate 74 4/7 ECG is atrial fibrillation, heart rate 97 Telemetry:  Telemetry was personally reviewed and demonstrates: Atrial fibrillation, heart rate has been gradually increasing over the last 12 hours or so.  At first, he was sustained in the 80s and 90s.  However now he is sustained in the 110s-130s   CV studies:   ECHO: Ordered  Laboratory Data:   Chemistry Recent Labs  Lab 03/10/20 0845 03/12/20 1443 03/12/20 1539 03/13/20 0218 03/13/20 0611  NA 131*   < > 133* 135 135  K 4.4   < > 5.4* 4.8 4.5  CL 101  --   --  100  --   CO2 17*  --   --  23  --   GLUCOSE 99  --   --  141*  --   BUN 9  --   --  17  --   CREATININE 0.77  --   --  1.43*  --   CALCIUM 9.0  --   --  7.9*  --   GFRNONAA >60  --   --  56*  --   GFRAA >60  --   --  >60  --   ANIONGAP 13  --   --  12  --    < > = values in this interval not displayed.    Lab Results  Component Value Date   ALT 20 03/10/2020   AST 21 03/10/2020   ALKPHOS 36 (L) 03/10/2020   BILITOT 1.9 (H) 03/10/2020   Hematology Recent Labs  Lab 03/10/20 0845 03/12/20 1443 03/12/20 1539 03/13/20 0218 03/13/20 0611  WBC 6.8  --   --  9.0  --   RBC 5.48  --   --  4.63  --   HGB 15.7   < > 14.6 13.5 12.9*  HCT 48.3   < > 43.0 41.1 38.0*  MCV 88.1  --   --  88.8  --   MCH 28.6  --   --  29.2  --   MCHC  32.5  --   --  32.8  --   RDW 12.8  --   --  13.5  --   PLT 238  --   --  185  --    < > = values in this interval not displayed.   Cardiac Enzymes High Sensitivity Troponin:  No results for input(s): TROPONINIHS in the last 720 hours.    BNPNo results for input(s): BNP, PROBNP in the last 168 hours.  DDimer No results for input(s): DDIMER in the last 168 hours. TSH:  Lab Results  Component Value Date  TSH 3.11 02/19/2020   Lipids: Lab Results  Component Value Date   CHOL 181 12/27/2019   HDL 52 12/27/2019   LDLCALC 112 (H) 12/27/2019   TRIG 79 12/27/2019   CHOLHDL 3.5 12/27/2019   HgbA1c:No results found for: HGBA1C Magnesium: No results found for: MG   Radiology/Studies:  DG Chest 2 View  Result Date: 03/10/2020 CLINICAL DATA:  Pre-surgical evaluation for hernia repair. History of recurrent right pleural effusion. EXAM: CHEST - 2 VIEW COMPARISON:  02/29/2020 FINDINGS: Hiatal hernia as seen previously. Mild atelectasis at the left base secondary to that. No sign of left effusion. Moderate sized right effusion layering dependently. This is slightly larger than was seen on the previous exam, associated with right base atelectasis. Upper lungs are clear. No significant bone finding. IMPRESSION: Chronic right pleural effusion is larger than was seen 2 weeks ago. Associated right base atelectasis. Chronic hiatal hernia. Electronically Signed   By: Paulina Fusi M.D.   On: 03/10/2020 09:08   DG CHEST PORT 1 VIEW  Result Date: 03/13/2020 CLINICAL DATA:  Status post hiatal hernia repair. EXAM: PORTABLE CHEST 1 VIEW COMPARISON:  03/12/2020 FINDINGS: Right-sided chest tube is stable. No pneumothorax. Stable cardiac enlargement and bibasilar atelectasis. No definite pleural effusions. IMPRESSION: 1. Stable right-sided chest tube without pneumothorax. 2. Bibasilar atelectasis. Electronically Signed   By: Rudie Meyer M.D.   On: 03/13/2020 09:02   DG Chest Port 1 View  Result Date:  03/12/2020 CLINICAL DATA:  Chest tube, postop EXAM: PORTABLE CHEST 1 VIEW COMPARISON:  03/10/2020 FINDINGS: Right chest tube has been placed with decreasing right pleural effusion. No pneumothorax. Cardiomegaly. Large hiatal hernia noted. Vascular congestion. No overt edema. IMPRESSION: Interval placement of right chest tube with decreasing right pleural effusion. No pneumothorax. Cardiomegaly, vascular congestion. Large hiatal hernia. Electronically Signed   By: Charlett Nose M.D.   On: 03/12/2020 18:15   DG ESOPHAGUS W SINGLE CM (SOL OR THIN BA)  Result Date: 03/13/2020 CLINICAL DATA:  Patient underwent repair of a hiatal hernia on 03/12/2020. Evaluate for repair integrity. EXAM: ESOPHOGRAM/BARIUM SWALLOW TECHNIQUE: Single contrast examination was performed using water-soluble contrast. FLUOROSCOPY TIME:  Fluoroscopy Time:  48 seconds Radiation Exposure Index (if provided by the fluoroscopic device): 42.8 mGy Number of Acquired Spot Images: 8 COMPARISON:  None. FINDINGS: Esophagus is normal in course and in caliber. Gastroesophageal junction is patent with contrast entering the stomach. There is no extravasation of contrast to indicate a postoperative leak. No reflux was noted during the study. No hiatal hernia. IMPRESSION: 1. Repaired hiatal hernia. No evidence of a postoperative leak. No reflux or significant narrowing at the gastroesophageal junction. Electronically Signed   By: Amie Portland M.D.   On: 03/13/2020 08:49    Assessment and Plan:   1.  Persistent atrial fibrillation, RVR -The amiodarone is helping with rate control. -Resuming beta-blocker is limited by SBP down in the 80s at times. -However, being off the beta-blocker is undoubtedly contributing to the increased heart rate as is his postop state -MD advise on restarting metoprolol at 12.5 mg every 6 hours with parameters.  Hopefully, his blood pressure will come up as he improves  2. S/p ESOPHAGOGASTRODUODENOSCOPY (EGD) (N/A); XI  ROBOTIC ASSISTED HIATAL HERNIA REPAIR USING ACELL MATRIX HIATAL (N/A); VIDEO ASSISTED THORACOSCOPY (VATS)/DRAINING OF AN EMPYEMA (Right) -Per surgical team, patient seems to be doing well  3.  Anticoagulation: -Hemoglobin and hematocrit were normal on admission. -When he was on Eliquis for just a few weeks in 2019,  his hemoglobin dropped to 8.7. -In June 2020, he was diagnosed with iron deficiency anemia, but hemoglobin hematocrit had come back up off Eliquis. -Etiology was unclear, EGD and colonoscopy were unrevealing (nonbleeding internal hemorrhoids) and the capsule study was negative.  However, he had H. pylori gastritis on pathology. -CT scan was performed as well, but was unrevealing -The hiatal hernia was felt to be a potential but rare cause of iron deficiency and anemia and that is when they started considering surgery for it. -In 2020, he was drinking 8-10 beers a day -MD advise if we should consider him for anticoagulation or just have him follow-up as an outpatient    Active Problems:   History of repair of hiatal hernia     For questions or updates, please contact [facility] Please consult [email or web address] for contact info under Cardiology/STEMI.   Jonetta Speak, PA-C  03/13/2020 12:14 PM

## 2020-03-13 NOTE — Telephone Encounter (Signed)
Pt is scheduled on 03/26/2020

## 2020-03-13 NOTE — Plan of Care (Signed)
  Problem: Education: Goal: Ability to identify signs and symptoms of gastrointestinal bleeding will improve Outcome: Progressing   Problem: Bowel/Gastric: Goal: Will show no signs and symptoms of gastrointestinal bleeding Outcome: Progressing   Problem: Fluid Volume: Goal: Will show no signs and symptoms of excessive bleeding Outcome: Progressing   Problem: Clinical Measurements: Goal: Complications related to the disease process, condition or treatment will be avoided or minimized Outcome: Progressing   Problem: Education: Goal: Knowledge of General Education information will improve Description: Including pain rating scale, medication(s)/side effects and non-pharmacologic comfort measures Outcome: Progressing   Problem: Health Behavior/Discharge Planning: Goal: Ability to manage health-related needs will improve Outcome: Progressing   Problem: Clinical Measurements: Goal: Ability to maintain clinical measurements within normal limits will improve Outcome: Progressing Goal: Will remain free from infection Outcome: Progressing Goal: Diagnostic test results will improve Outcome: Progressing Goal: Respiratory complications will improve Outcome: Progressing Goal: Cardiovascular complication will be avoided Outcome: Progressing   Problem: Activity: Goal: Risk for activity intolerance will decrease Outcome: Progressing   Problem: Nutrition: Goal: Adequate nutrition will be maintained Outcome: Progressing   Problem: Coping: Goal: Level of anxiety will decrease Outcome: Progressing   Problem: Elimination: Goal: Will not experience complications related to bowel motility Outcome: Progressing Goal: Will not experience complications related to urinary retention Outcome: Progressing   Problem: Pain Managment: Goal: General experience of comfort will improve Outcome: Progressing   Problem: Safety: Goal: Ability to remain free from injury will improve Outcome:  Progressing   Problem: Skin Integrity: Goal: Risk for impaired skin integrity will decrease Outcome: Progressing   Problem: Education: Goal: Required Educational Video(s) Outcome: Progressing   Problem: Clinical Measurements: Goal: Postoperative complications will be avoided or minimized Outcome: Progressing   Problem: Skin Integrity: Goal: Demonstration of wound healing without infection will improve Outcome: Progressing   Problem: Education: Goal: Knowledge of disease or condition will improve Outcome: Progressing Goal: Knowledge of the prescribed therapeutic regimen will improve Outcome: Progressing   Problem: Activity: Goal: Risk for activity intolerance will decrease Outcome: Progressing   Problem: Cardiac: Goal: Will achieve and/or maintain hemodynamic stability Outcome: Progressing   Problem: Clinical Measurements: Goal: Postoperative complications will be avoided or minimized Outcome: Progressing   Problem: Respiratory: Goal: Respiratory status will improve Outcome: Progressing   Problem: Pain Management: Goal: Pain level will decrease Outcome: Progressing   Problem: Skin Integrity: Goal: Wound healing without signs and symptoms infection will improve Outcome: Progressing   

## 2020-03-13 NOTE — Progress Notes (Signed)
[facility] DAILY ICU PROGRESS NOTE                   [address]            [place] [address]          [phone]   1 Day Post-Op Procedure(s) (LRB): ESOPHAGOGASTRODUODENOSCOPY (EGD) (N/A) XI ROBOTIC ASSISTED HIATAL HERNIA REPAIR USING ACELL MATRIX HIATAL (N/A) VIDEO ASSISTED THORACOSCOPY (VATS)/DRAINING OF AN EMPYEMA (Right)  Total Length of Stay:  LOS: 1 day   Subjective: Feels pretty well, minimal pain, no significant nausea, mild SOB. Remains in afib which is currently rate controlled  Objective: Vital signs in last 24 hours: Temp:  [97.6 F (36.4 C)-99.3 F (37.4 C)] 97.6 F (36.4 C) (04/08 0334) Pulse Rate:  [48-134] 48 (04/08 0615) Cardiac Rhythm: Atrial fibrillation (04/08 0400) Resp:  [15-37] 23 (04/08 0615) BP: (84-136)/(52-107) 84/52 (04/08 0615) SpO2:  [96 %-100 %] 100 % (04/08 0615) Arterial Line BP: (96-109)/(57-73) 98/57 (04/07 1800) Weight:  [97.8 kg] 97.8 kg (04/08 0400)  Filed Weights   03/12/20 0648 03/13/20 0400  Weight: 97 kg 97.8 kg    Weight change: 0.8 kg   Hemodynamic parameters for last 24 hours:    Intake/Output from previous day: 04/07 0701 - 04/08 0700 In: 5973.5 [I.V.:4873.5; IV Piggyback:1100] Out: 2985 [Urine:1435; Blood:300; Chest Tube:1250]  Intake/Output this shift: No intake/output data recorded.  Current Meds: Scheduled Meds: . acetaminophen  1,000 mg Oral Q6H   Or  . acetaminophen (TYLENOL) oral liquid 160 mg/5 mL  1,000 mg Oral Q6H  . bisacodyl  10 mg Oral Daily  . Chlorhexidine Gluconate Cloth  6 each Topical Daily  . folic acid  1 mg Oral Daily  . ipratropium-albuterol  3 mL Nebulization Q6H  . ketorolac  15 mg Intravenous Q6H  . mouth rinse  15 mL Mouth Rinse BID  . multivitamin with minerals  1 tablet Oral Daily  . senna-docusate  1 tablet Oral QHS  . thiamine  100 mg Oral Daily   Continuous Infusions: . sodium chloride    . amiodarone 30 mg/hr (03/12/20 2343)  . dexmedetomidine (PRECEDEX) IV  infusion    . diltiazem (CARDIZEM) infusion    . vasopressin (PITRESSIN) infusion - *FOR SHOCK*     PRN Meds:.Place/Maintain arterial line **AND** sodium chloride, LORazepam, morphine injection, ondansetron (ZOFRAN) IV, pneumococcal 23 valent vaccine, traMADol  General appearance: alert, cooperative and no distress Heart: irregularly irregular rhythm Lungs: clear anteriorly Abdomen: soft, min tenderness, scant BS, non distended Extremities: PAS in place, no edema Wound: incis healing well  Lab Results: CBC: Recent Labs    03/10/20 0845 03/12/20 1443 03/13/20 0218 03/13/20 0611  WBC 6.8  --  9.0  --   HGB 15.7   < > 13.5 12.9*  HCT 48.3   < > 41.1 38.0*  PLT 238  --  185  --    < > = values in this interval not displayed.   BMET:  Recent Labs    03/10/20 0845 03/12/20 1443 03/13/20 0218 03/13/20 0611  NA 131*   < > 135 135  K 4.4   < > 4.8 4.5  CL 101  --  100  --   CO2 17*  --  23  --   GLUCOSE 99  --  141*  --   BUN 9  --  17  --   CREATININE 0.77  --  1.43*  --   CALCIUM 9.0  --  7.9*  --    < > = values in this interval not displayed.    CMET: Lab Results  Component Value Date   WBC 9.0 03/13/2020   HGB 12.9 (L) 03/13/2020   HCT 38.0 (L) 03/13/2020   PLT 185 03/13/2020   GLUCOSE 141 (H) 03/13/2020   CHOL 181 12/27/2019   TRIG 79 12/27/2019   HDL 52 12/27/2019   LDLCALC 112 (H) 12/27/2019   ALT 20 03/10/2020   AST 21 03/10/2020   NA 135 03/13/2020   K 4.5 03/13/2020   CL 100 03/13/2020   CREATININE 1.43 (H) 03/13/2020   BUN 17 03/13/2020   CO2 23 03/13/2020   TSH 3.11 02/19/2020   INR 1.1 03/10/2020   MICROALBUR 0.2 01/28/2020      PT/INR:  Recent Labs    03/10/20 0845  LABPROT 14.1  INR 1.1   Radiology: [facility] Chest Port 1 View  Result Date: 03/12/2020 CLINICAL DATA:  Chest tube, postop EXAM: PORTABLE CHEST 1 VIEW COMPARISON:  03/10/2020 FINDINGS: Right chest tube has been placed with decreasing right pleural effusion. No pneumothorax.  Cardiomegaly. Large hiatal hernia noted. Vascular congestion. No overt edema. IMPRESSION: Interval placement of right chest tube with decreasing right pleural effusion. No pneumothorax. Cardiomegaly, vascular congestion. Large hiatal hernia. Electronically Signed   By: Charlett Nose M.D.   On: 03/12/2020 18:15     Assessment/Plan: S/P Procedure(s) (LRB): ESOPHAGOGASTRODUODENOSCOPY (EGD) (N/A) XI ROBOTIC ASSISTED HIATAL HERNIA REPAIR USING ACELL MATRIX HIATAL (N/A) VIDEO ASSISTED THORACOSCOPY (VATS)/DRAINING OF AN EMPYEMA (Right)  1 doing well POD#1 2 stable hemodynamics with low relative BP, on amio gtt for chronic afib- rate controlled. Has had afib for over year. Hasn't been on ACRX recently . Got anemic from what sounds like NOAC previously. Will need close f/u with cardiology.  3 moderate CT drainage 1250 cc so far post op- serosang- keep tubes in place 4 CXR - atx, no pntx- sats 100 percent on 2 liters, routine pulm toilet 5 GG swallow today- if neg can start to advance diet 6 mild bump in creat- will stop toradol for now- min pain, has prn morphine till able to take po 7 H/H fairly stable stable- monitor clinically  Rowe Clack PA-C Pager [phone] 03/13/2020 7:18 AM

## 2020-03-13 NOTE — Progress Notes (Signed)
Spoke to [employer] PA this afternoon, after Pt transferred, needed order for diet.  Also, clarification for A-Fib RVR  And notification.  Pt arrived to unit with HR in 130's, but did not sustained, is now in 100's and upper 110's and 120's but does not sustain the 120's.  Dean Anderson advised that they cannot start Pt back on Beta-Blockers at this time due to B/P, but as soon as his B/P stabilities they will start back,and hoping his HR will come down.  Advised to monitor his HR and notify when sustains 130's for orders.

## 2020-03-13 NOTE — Telephone Encounter (Signed)
-----   Message from Cathlyn Parsons, NP sent at 03/10/2020  1:20 PM EDT ----- Dear Ms. [name],   I am a NP with anesthesiology at [facility]. Your patient, Dean Anderson, is scheduled for a hiatal hernia repair and VATS/decortication for recurrent pleural effusion with Dr. Brynda Greathouse on 03/12/20.  He was see at pre-admission testing this morning and his blood pressure was uncontrolled: 141/109, 155/102, 140/110.  [name] reports he is taking his anti-hypertensive medication as prescribed.  He was advised to reach out to you for further tx of his HTN.  I am forwarding this to you for follow up purposes.  Best regards,  Rica Mast, FNP-BC [facility] Short Stay Surgical [facility]/Anesthesiology Phone: [phone] 03/10/2020 1:19 PM

## 2020-03-14 ENCOUNTER — Inpatient Hospital Stay (HOSPITAL_COMMUNITY): Payer: Medicaid Other

## 2020-03-14 LAB — COMPREHENSIVE METABOLIC PANEL
ALT: 50 U/L — ABNORMAL HIGH (ref 0–44)
AST: 42 U/L — ABNORMAL HIGH (ref 15–41)
Albumin: 3.2 g/dL — ABNORMAL LOW (ref 3.5–5.0)
Alkaline Phosphatase: 31 U/L — ABNORMAL LOW (ref 38–126)
Anion gap: 12 (ref 5–15)
BUN: 25 mg/dL — ABNORMAL HIGH (ref 6–20)
CO2: 26 mmol/L (ref 22–32)
Calcium: 8.3 mg/dL — ABNORMAL LOW (ref 8.9–10.3)
Chloride: 98 mmol/L (ref 98–111)
Creatinine, Ser: 1.56 mg/dL — ABNORMAL HIGH (ref 0.61–1.24)
GFR calc Af Amer: 59 mL/min — ABNORMAL LOW (ref >60)
GFR calc non Af Amer: 51 mL/min — ABNORMAL LOW (ref >60)
Glucose, Bld: 116 mg/dL — ABNORMAL HIGH (ref 70–99)
Potassium: 4.5 mmol/L (ref 3.5–5.1)
Sodium: 136 mmol/L (ref 135–145)
Total Bilirubin: 2 mg/dL — ABNORMAL HIGH (ref 0.3–1.2)
Total Protein: 6.1 g/dL — ABNORMAL LOW (ref 6.5–8.1)

## 2020-03-14 LAB — CBC
HCT: 43.3 % (ref 39.0–52.0)
Hemoglobin: 13.7 g/dL (ref 13.0–17.0)
MCH: 29 pg (ref 26.0–34.0)
MCHC: 31.6 g/dL (ref 30.0–36.0)
MCV: 91.7 fL (ref 80.0–100.0)
Platelets: 201 10*3/uL (ref 150–400)
RBC: 4.72 MIL/uL (ref 4.22–5.81)
RDW: 13.7 % (ref 11.5–15.5)
WBC: 10.8 10*3/uL — ABNORMAL HIGH (ref 4.0–10.5)
nRBC: 0 % (ref 0.0–0.2)

## 2020-03-14 LAB — CYTOLOGY - NON PAP

## 2020-03-14 LAB — ACID FAST SMEAR (AFB, MYCOBACTERIA): Acid Fast Smear: NEGATIVE

## 2020-03-14 MED ORDER — ENOXAPARIN SODIUM 40 MG/0.4ML ~~LOC~~ SOLN
40.0000 mg | Freq: Every day | SUBCUTANEOUS | Status: DC
  Administered 2020-03-14 – 2020-03-16 (×3): 40 mg via SUBCUTANEOUS
  Filled 2020-03-14 (×3): qty 0.4

## 2020-03-14 MED ORDER — SPIRONOLACTONE 25 MG PO TABS
25.0000 mg | ORAL_TABLET | Freq: Every day | ORAL | Status: DC
  Administered 2020-03-14 – 2020-03-16 (×3): 25 mg via ORAL
  Filled 2020-03-14 (×3): qty 1

## 2020-03-14 MED ORDER — AMIODARONE HCL 200 MG PO TABS: 400.0000 mg | ORAL_TABLET | Freq: Two times a day (BID) | ORAL | Status: DC

## 2020-03-14 MED ORDER — AMIODARONE HCL 200 MG PO TABS
400.0000 mg | ORAL_TABLET | Freq: Two times a day (BID) | ORAL | Status: DC
  Administered 2020-03-14 – 2020-03-16 (×4): 400 mg via ORAL
  Filled 2020-03-14 (×5): qty 2

## 2020-03-14 NOTE — Plan of Care (Signed)
  Problem: Education: Goal: Ability to identify signs and symptoms of gastrointestinal bleeding will improve Outcome: Progressing   Problem: Bowel/Gastric: Goal: Will show no signs and symptoms of gastrointestinal bleeding Outcome: Progressing   Problem: Clinical Measurements: Goal: Complications related to the disease process, condition or treatment will be avoided or minimized Outcome: Progressing   Problem: Education: Goal: Knowledge of General Education information will improve Description: Including pain rating scale, medication(s)/side effects and non-pharmacologic comfort measures Outcome: Progressing   Problem: Health Behavior/Discharge Planning: Goal: Ability to manage health-related needs will improve Outcome: Progressing   Problem: Clinical Measurements: Goal: Ability to maintain clinical measurements within normal limits will improve Outcome: Progressing Goal: Will remain free from infection Outcome: Progressing Goal: Diagnostic test results will improve Outcome: Progressing Goal: Respiratory complications will improve Outcome: Progressing Goal: Cardiovascular complication will be avoided Outcome: Progressing   Problem: Activity: Goal: Risk for activity intolerance will decrease Outcome: Progressing   Problem: Nutrition: Goal: Adequate nutrition will be maintained Outcome: Progressing   Problem: Coping: Goal: Level of anxiety will decrease Outcome: Progressing   Problem: Pain Managment: Goal: General experience of comfort will improve Outcome: Progressing   Problem: Elimination: Goal: Will not experience complications related to bowel motility Outcome: Progressing Goal: Will not experience complications related to urinary retention Outcome: Progressing   Problem: Safety: Goal: Ability to remain free from injury will improve Outcome: Progressing

## 2020-03-14 NOTE — TOC Benefit Eligibility Note (Signed)
Transition of Care [facility]) Benefit Eligibility Note    Patient Details  Name: Wash Nienhaus MRN: [id number] Date of Birth: 28-Jan-1966   Medication/Dose: ELIQUIS 2.5 MG BID , ELIQUIS 5 MG  BID   and      XARELTO 15 MG BID  , XARELTO  20 MG DAILY        Prescription Coverage Preferred Pharmacy: [facility]              Additional Notes: MEDICAID OF [place], EFF-DATE: 04-06-2019 ,CO-PAY $3.90 FOR EACH PRESCRIPTION    Mardene Sayer Phone Number: 03/14/2020, 9:54 AM

## 2020-03-14 NOTE — Progress Notes (Addendum)
[address]       [place] [address]             [phone]      2 Days Post-Op Procedure(s) (LRB): ESOPHAGOGASTRODUODENOSCOPY (EGD) (N/A) XI ROBOTIC ASSISTED HIATAL HERNIA REPAIR USING ACELL MATRIX HIATAL (N/A) VIDEO ASSISTED THORACOSCOPY (VATS)/DRAINING OF AN EMPYEMA (Right) Subjective: Feels pretty well. Not SOB No nausea or troubles with liquid diet   Objective: Vital signs in last 24 hours: Temp:  [97.5 F (36.4 C)-98.5 F (36.9 C)] 97.5 F (36.4 C) (04/09 0746) Pulse Rate:  [25-134] 95 (04/09 0746) Cardiac Rhythm: Atrial fibrillation (04/08 1900) Resp:  [15-87] 19 (04/09 0746) BP: (86-116)/(61-88) 108/81 (04/09 0746) SpO2:  [94 %-100 %] 98 % (04/09 0746) Arterial Line BP: (108)/(68) 108/68 (04/08 0900)  Hemodynamic parameters for last 24 hours:    Intake/Output from previous day: 04/08 0701 - 04/09 0700 In: 711.6 [P.O.:480; I.V.:231.6] Out: 1105 [Urine:380; Chest Tube:725] Intake/Output this shift: No intake/output data recorded.  General appearance: alert, cooperative and no distress Heart: irregularly irregular rhythm Lungs: dim in bases Abdomen: mild distension, nontender, + BS(scany) Extremities: no edema or calf tenderness Wound: incis healing well  Lab Results: Recent Labs    03/13/20 0218 03/13/20 0218 03/13/20 0611 03/14/20 0130  WBC 9.0  --   --  10.8*  HGB 13.5   < > 12.9* 13.7  HCT 41.1   < > 38.0* 43.3  PLT 185  --   --  201   < > = values in this interval not displayed.   BMET:  Recent Labs    03/13/20 0218 03/13/20 0218 03/13/20 0611 03/14/20 0550  NA 135   < > 135 136  K 4.8   < > 4.5 4.5  CL 100  --   --  98  CO2 23  --   --  26  GLUCOSE 141*  --   --  116*  BUN 17  --   --  25*  CREATININE 1.43*  --   --  1.56*  CALCIUM 7.9*  --   --  8.3*   < > = values in this interval not displayed.    PT/INR: No results for input(s): LABPROT, INR in the last 72 hours. ABG    Component Value Date/Time     PHART 7.492 (H) 03/13/2020 0611   HCO3 24.7 03/13/2020 0611   TCO2 26 03/13/2020 0611   ACIDBASEDEF 4.0 (H) 03/12/2020 1539   O2SAT 99.0 03/13/2020 0611   CBG (last 3)  Recent Labs    03/13/20 2138  GLUCAP 113*    Meds Scheduled Meds: . acetaminophen  1,000 mg Oral Q6H   Or  . acetaminophen (TYLENOL) oral liquid 160 mg/5 mL  1,000 mg Oral Q6H  . bisacodyl  10 mg Oral Daily  . folic acid  1 mg Oral Daily  . levalbuterol  0.63 mg Nebulization TID  . levothyroxine  88 mcg Oral Q0600  . mouth rinse  15 mL Mouth Rinse BID  . metoprolol tartrate  25 mg Oral BID  . multivitamin with minerals  1 tablet Oral Daily  . pantoprazole  40 mg Oral Daily  . senna-docusate  1 tablet Oral QHS  . thiamine  100 mg Oral Daily   Continuous Infusions: . amiodarone 30 mg/hr (03/14/20 0655)   PRN Meds:.fluticasone, morphine injection, ondansetron (ZOFRAN) IV, pneumococcal 23 valent vaccine, traMADol  Xrays DG CHEST PORT 1 VIEW  Result Date: 03/13/2020  CLINICAL DATA:  Status post hiatal hernia repair. EXAM: PORTABLE CHEST 1 VIEW COMPARISON:  03/12/2020 FINDINGS: Right-sided chest tube is stable. No pneumothorax. Stable cardiac enlargement and bibasilar atelectasis. No definite pleural effusions. IMPRESSION: 1. Stable right-sided chest tube without pneumothorax. 2. Bibasilar atelectasis. Electronically Signed   By: Rudie Meyer M.D.   On: 03/13/2020 09:02   DG Chest Port 1 View  Result Date: 03/12/2020 CLINICAL DATA:  Chest tube, postop EXAM: PORTABLE CHEST 1 VIEW COMPARISON:  03/10/2020 FINDINGS: Right chest tube has been placed with decreasing right pleural effusion. No pneumothorax. Cardiomegaly. Large hiatal hernia noted. Vascular congestion. No overt edema. IMPRESSION: Interval placement of right chest tube with decreasing right pleural effusion. No pneumothorax. Cardiomegaly, vascular congestion. Large hiatal hernia. Electronically Signed   By: Charlett Nose M.D.   On: 03/12/2020 18:15   DG  ESOPHAGUS W SINGLE CM (SOL OR THIN BA)  Result Date: 03/13/2020 CLINICAL DATA:  Patient underwent repair of a hiatal hernia on 03/12/2020. Evaluate for repair integrity. EXAM: ESOPHOGRAM/BARIUM SWALLOW TECHNIQUE: Single contrast examination was performed using water-soluble contrast. FLUOROSCOPY TIME:  Fluoroscopy Time:  48 seconds Radiation Exposure Index (if provided by the fluoroscopic device): 42.8 mGy Number of Acquired Spot Images: 8 COMPARISON:  None. FINDINGS: Esophagus is normal in course and in caliber. Gastroesophageal junction is patent with contrast entering the stomach. There is no extravasation of contrast to indicate a postoperative leak. No reflux was noted during the study. No hiatal hernia. IMPRESSION: 1. Repaired hiatal hernia. No evidence of a postoperative leak. No reflux or significant narrowing at the gastroesophageal junction. Electronically Signed   By: Amie Portland M.D.   On: 03/13/2020 08:49    Assessment/Plan: S/P Procedure(s) (LRB): ESOPHAGOGASTRODUODENOSCOPY (EGD) (N/A) XI ROBOTIC ASSISTED HIATAL HERNIA REPAIR USING ACELL MATRIX HIATAL (N/A) VIDEO ASSISTED THORACOSCOPY (VATS)/DRAINING OF AN EMPYEMA (Right)  1 stable but afib rate control has been an issue 2 sats ok on RA 3 CT- moderate drainage persists, 725 cc yesterday 4 creat bumped a little bit- toradol is stopped, monitor. Does not appear to be on other nephrotoxic drugs 5 minor elevation of liver enzymes- monitor 6 H/H improved 7 no leukocytosis or fevers 8  advance diet to full liquid 8 add lovenox for DVT PPX   LOS: 2 days    Rowe Clack PA-C Pager [phone] 03/14/2020   LFTs evaluated.  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 Spironolactone will be started Chest tube will be removed tomorrow  We will likely discharge tomorrow with full liquid diet Follow-up with GI, cardiology, and myself.

## 2020-03-14 NOTE — Progress Notes (Signed)
Primary Cardiologist:  Agbor-Etang Subjective:  Sitting up with no dyspnea pr palpitations   Objective:  Vitals:   03/14/20 0556 03/14/20 0600 03/14/20 0712 03/14/20 0746  BP:    108/81  Pulse: 97 97 90 95  Resp: (!) 29 19 15 19   Temp:    (!) 97.5 F (36.4 C)  TempSrc:    Oral  SpO2:   100% 98%  Weight:      Height:        Intake/Output from previous day:  Intake/Output Summary (Last 24 hours) at 03/14/2020 0819 Last data filed at 03/14/2020 0656 Gross per 24 hour  Intake 678.23 ml  Output 1045 ml  Net -366.77 ml    Physical Exam: Affect appropriate Obese white male  HEENT: normal Neck supple with no adenopathy JVP normal no bruits no thyromegaly Lungs right sided chest tube in place  Heart:  S1/S2 no murmur, no rub, gallop or click PMI normal Abdomen: post lap hernia repair  no bruit.  No HSM or HJR Distal pulses intact with no bruits No edema Neuro non-focal Skin warm and dry No muscular weakness   Lab Results: Basic Metabolic Panel: Recent Labs    03/13/20 0218 03/13/20 0218 03/13/20 0611 03/14/20 0550  NA 135   < > 135 136  K 4.8   < > 4.5 4.5  CL 100  --   --  98  CO2 23  --   --  26  GLUCOSE 141*  --   --  116*  BUN 17  --   --  25*  CREATININE 1.43*  --   --  1.56*  CALCIUM 7.9*  --   --  8.3*   < > = values in this interval not displayed.   Liver Function Tests: Recent Labs    03/14/20 0550  AST 42*  ALT 50*  ALKPHOS 31*  BILITOT 2.0*  PROT 6.1*  ALBUMIN 3.2*   No results for input(s): LIPASE, AMYLASE in the last 72 hours. CBC: Recent Labs    03/13/20 0218 03/13/20 0218 03/13/20 0611 03/14/20 0130  WBC 9.0  --   --  10.8*  HGB 13.5   < > 12.9* 13.7  HCT 41.1   < > 38.0* 43.3  MCV 88.8  --   --  91.7  PLT 185  --   --  201   < > = values in this interval not displayed.    Imaging: DG CHEST PORT 1 VIEW  Result Date: 03/13/2020 CLINICAL DATA:  Status post hiatal hernia repair. EXAM: PORTABLE CHEST 1 VIEW COMPARISON:   03/12/2020 FINDINGS: Right-sided chest tube is stable. No pneumothorax. Stable cardiac enlargement and bibasilar atelectasis. No definite pleural effusions. IMPRESSION: 1. Stable right-sided chest tube without pneumothorax. 2. Bibasilar atelectasis. Electronically Signed   By: Marijo Sanes M.D.   On: 03/13/2020 09:02   DG Chest Port 1 View  Result Date: 03/12/2020 CLINICAL DATA:  Chest tube, postop EXAM: PORTABLE CHEST 1 VIEW COMPARISON:  03/10/2020 FINDINGS: Right chest tube has been placed with decreasing right pleural effusion. No pneumothorax. Cardiomegaly. Large hiatal hernia noted. Vascular congestion. No overt edema. IMPRESSION: Interval placement of right chest tube with decreasing right pleural effusion. No pneumothorax. Cardiomegaly, vascular congestion. Large hiatal hernia. Electronically Signed   By: Rolm Baptise M.D.   On: 03/12/2020 18:15   DG ESOPHAGUS W SINGLE CM (SOL OR THIN BA)  Result Date: 03/13/2020 CLINICAL DATA:  Patient underwent repair of a hiatal hernia  on 03/12/2020. Evaluate for repair integrity. EXAM: ESOPHOGRAM/BARIUM SWALLOW TECHNIQUE: Single contrast examination was performed using water-soluble contrast. FLUOROSCOPY TIME:  Fluoroscopy Time:  48 seconds Radiation Exposure Index (if provided by the fluoroscopic device): 42.8 mGy Number of Acquired Spot Images: 8 COMPARISON:  None. FINDINGS: Esophagus is normal in course and in caliber. Gastroesophageal junction is patent with contrast entering the stomach. There is no extravasation of contrast to indicate a postoperative leak. No reflux was noted during the study. No hiatal hernia. IMPRESSION: 1. Repaired hiatal hernia. No evidence of a postoperative leak. No reflux or significant narrowing at the gastroesophageal junction. Electronically Signed   By: Amie Portland M.D.   On: 03/13/2020 08:49    Cardiac Studies:  ECG: Afib rate 91 otherwise normal ECG    Telemetry:  Afib/flutter rates in 90's   Echo: pending    Medications:   . acetaminophen  1,000 mg Oral Q6H   Or  . acetaminophen (TYLENOL) oral liquid 160 mg/5 mL  1,000 mg Oral Q6H  . bisacodyl  10 mg Oral Daily  . enoxaparin (LOVENOX) injection  40 mg Subcutaneous Q24H  . folic acid  1 mg Oral Daily  . levalbuterol  0.63 mg Nebulization TID  . levothyroxine  88 mcg Oral Q0600  . mouth rinse  15 mL Mouth Rinse BID  . metoprolol tartrate  25 mg Oral BID  . multivitamin with minerals  1 tablet Oral Daily  . pantoprazole  40 mg Oral Daily  . senna-docusate  1 tablet Oral QHS  . thiamine  100 mg Oral Daily     . amiodarone 30 mg/hr (03/14/20 0655)    Assessment/Plan:   1. Afib:  Persistent rate control ok on bid lopressor Given recent surgery and chest tube still in would start DOAC 2 weeks after d/c to prevent recurrent pleural effusion and let surgical incisions heal TTE pending  In regard to LV function and atrial size  2. Pleural effusion:  Post tap and now decortication CT still draining per CVTS   3. 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 GI bleeding:  Previous w/u including capsule endoscopy negative ? Related to large hiatal hernia that is now repaired Rechallenge with DOAC in 2 weeks   Dean Anderson 03/14/2020, 8:19 AM

## 2020-03-14 NOTE — Plan of Care (Signed)
  Problem: Education: Goal: Ability to identify signs and symptoms of gastrointestinal bleeding will improve Outcome: Progressing   Problem: Bowel/Gastric: Goal: Will show no signs and symptoms of gastrointestinal bleeding Outcome: Progressing   Problem: Fluid Volume: Goal: Will show no signs and symptoms of excessive bleeding Outcome: Progressing   Problem: Clinical Measurements: Goal: Complications related to the disease process, condition or treatment will be avoided or minimized Outcome: Progressing   Problem: Education: Goal: Knowledge of General Education information will improve Description: Including pain rating scale, medication(s)/side effects and non-pharmacologic comfort measures Outcome: Progressing   Problem: Health Behavior/Discharge Planning: Goal: Ability to manage health-related needs will improve Outcome: Progressing   Problem: Clinical Measurements: Goal: Ability to maintain clinical measurements within normal limits will improve Outcome: Progressing Goal: Will remain free from infection Outcome: Progressing Goal: Diagnostic test results will improve Outcome: Progressing Goal: Respiratory complications will improve Outcome: Progressing Goal: Cardiovascular complication will be avoided Outcome: Progressing   Problem: Activity: Goal: Risk for activity intolerance will decrease Outcome: Progressing   Problem: Nutrition: Goal: Adequate nutrition will be maintained Outcome: Progressing   Problem: Coping: Goal: Level of anxiety will decrease Outcome: Progressing   Problem: Elimination: Goal: Will not experience complications related to bowel motility Outcome: Progressing Goal: Will not experience complications related to urinary retention Outcome: Progressing   Problem: Pain Managment: Goal: General experience of comfort will improve Outcome: Progressing   Problem: Safety: Goal: Ability to remain free from injury will improve Outcome:  Progressing   Problem: Skin Integrity: Goal: Risk for impaired skin integrity will decrease Outcome: Progressing   Problem: Education: Goal: Required Educational Video(s) Outcome: Progressing   Problem: Clinical Measurements: Goal: Postoperative complications will be avoided or minimized Outcome: Progressing   Problem: Skin Integrity: Goal: Demonstration of wound healing without infection will improve Outcome: Progressing   Problem: Education: Goal: Knowledge of disease or condition will improve Outcome: Progressing Goal: Knowledge of the prescribed therapeutic regimen will improve Outcome: Progressing   Problem: Activity: Goal: Risk for activity intolerance will decrease Outcome: Progressing   Problem: Cardiac: Goal: Will achieve and/or maintain hemodynamic stability Outcome: Progressing   Problem: Clinical Measurements: Goal: Postoperative complications will be avoided or minimized Outcome: Progressing   Problem: Respiratory: Goal: Respiratory status will improve Outcome: Progressing   Problem: Pain Management: Goal: Pain level will decrease Outcome: Progressing   Problem: Skin Integrity: Goal: Wound healing without signs and symptoms infection will improve Outcome: Progressing

## 2020-03-15 ENCOUNTER — Inpatient Hospital Stay (HOSPITAL_COMMUNITY): Payer: Medicaid Other

## 2020-03-15 LAB — BASIC METABOLIC PANEL
Anion gap: 12 (ref 5–15)
BUN: 22 mg/dL — ABNORMAL HIGH (ref 6–20)
CO2: 25 mmol/L (ref 22–32)
Calcium: 8.5 mg/dL — ABNORMAL LOW (ref 8.9–10.3)
Chloride: 98 mmol/L (ref 98–111)
Creatinine, Ser: 1.28 mg/dL — ABNORMAL HIGH (ref 0.61–1.24)
GFR calc Af Amer: >60 mL/min (ref >60)
GFR calc non Af Amer: >60 mL/min (ref >60)
Glucose, Bld: 98 mg/dL (ref 70–99)
Potassium: 4.4 mmol/L (ref 3.5–5.1)
Sodium: 135 mmol/L (ref 135–145)

## 2020-03-15 MED ORDER — LISINOPRIL 20 MG PO TABS
20.0000 mg | ORAL_TABLET | Freq: Every day | ORAL | Status: DC
  Administered 2020-03-15: 13:00:00 20 mg via ORAL
  Filled 2020-03-15: qty 1

## 2020-03-15 NOTE — Progress Notes (Signed)
Progress Note  Patient Name: Dean Anderson Date of Encounter: 03/15/2020  Primary Cardiologist: Kate Sable, MD    Subjective   54 year old gentleman with a history of alcoholism, GI bleeding, large hiatal hernia who is status post hiatal hernia repair.  He has had postoperative atrial fibrillation. He had VATS draining of an empyema with decortication.  He remains in atrial fibrillation.  He still has a chest tube in place.  He is not on anticoagulation at this time.  He is comfortable.  He is slowly improving and working with the incentive spirometer.  Signs are stable.  Labs were reviewed.  Inpatient Medications    Scheduled Meds: . acetaminophen  1,000 mg Oral Q6H   Or  . acetaminophen (TYLENOL) oral liquid 160 mg/5 mL  1,000 mg Oral Q6H  . amiodarone  400 mg Oral BID  . bisacodyl  10 mg Oral Daily  . enoxaparin (LOVENOX) injection  40 mg Subcutaneous Daily  . folic acid  1 mg Oral Daily  . levalbuterol  0.63 mg Nebulization TID  . levothyroxine  88 mcg Oral Q0600  . mouth rinse  15 mL Mouth Rinse BID  . metoprolol tartrate  25 mg Oral BID  . multivitamin with minerals  1 tablet Oral Daily  . pantoprazole  40 mg Oral Daily  . senna-docusate  1 tablet Oral QHS  . spironolactone  25 mg Oral Daily  . thiamine  100 mg Oral Daily   Continuous Infusions:  PRN Meds: fluticasone, morphine injection, ondansetron (ZOFRAN) IV, pneumococcal 23 valent vaccine, traMADol   Vital Signs    Vitals:   03/15/20 0252 03/15/20 0701 03/15/20 0829 03/15/20 0905  BP: (!) 163/121 (!) 137/97  (!) 133/99  Pulse: 99 79  85  Resp: (!) 22 19    Temp: 97.9 F (36.6 C) 97.9 F (36.6 C)    TempSrc: Oral Oral    SpO2: 98% 97% 97%   Weight:      Height:        Intake/Output Summary (Last 24 hours) at 03/15/2020 1005 Last data filed at 03/15/2020 0252 Gross per 24 hour  Intake 467.56 ml  Output 582 ml  Net -114.44 ml   Last 3 Weights 03/13/2020 03/12/2020 03/10/2020  Weight (lbs) 215  lb 9.8 oz 213 lb 13.5 oz 213 lb 14.4 oz  Weight (kg) 97.8 kg 97 kg 97.024 kg      Telemetry    Atrial fib  - Personally Reviewed  ECG      - Personally Reviewed  Physical Exam   GEN:  Middle-aged gentleman.  He has a right chest tube in.  He is in no acute distress. Neck: No JVD Cardiac:  Irregularly irregular. Respiratory:  Chest tube on the right side.  He has rhonchi  Suction sounds from the chest tube  GI: Soft, nontender, non-distended  MS: No edema; No deformity. Neuro:  Nonfocal  Psych: Normal affect   Labs    High Sensitivity Troponin:  No results for input(s): TROPONINIHS in the last 720 hours.    Chemistry Recent Labs  Lab 03/10/20 0845 03/12/20 1443 03/13/20 0218 03/13/20 0218 03/13/20 2440 03/14/20 0550 03/15/20 0228  NA 131*   < > 135   < > 135 136 135  K 4.4   < > 4.8   < > 4.5 4.5 4.4  CL 101  --  100  --   --  98 98  CO2 17*  --  23  --   --  26 25  GLUCOSE 99  --  141*  --   --  116* 98  BUN 9  --  17  --   --  25* 22*  CREATININE 0.77  --  1.43*  --   --  1.56* 1.28*  CALCIUM 9.0  --  7.9*  --   --  8.3* 8.5*  PROT 7.0  --   --   --   --  6.1*  --   ALBUMIN 3.6  --   --   --   --  3.2*  --   AST 21  --   --   --   --  42*  --   ALT 20  --   --   --   --  50*  --   ALKPHOS 36*  --   --   --   --  31*  --   BILITOT 1.9*  --   --   --   --  2.0*  --   GFRNONAA >60  --  56*  --   --  51* >60  GFRAA >60  --  >60  --   --  59* >60  ANIONGAP 13  --  12  --   --  12 12   < > = values in this interval not displayed.     Hematology Recent Labs  Lab 03/10/20 0845 03/12/20 1443 03/13/20 0218 03/13/20 0611 03/14/20 0130  WBC 6.8  --  9.0  --  10.8*  RBC 5.48  --  4.63  --  4.72  HGB 15.7   < > 13.5 12.9* 13.7  HCT 48.3   < > 41.1 38.0* 43.3  MCV 88.1  --  88.8  --  91.7  MCH 28.6  --  29.2  --  29.0  MCHC 32.5  --  32.8  --  31.6  RDW 12.8  --  13.5  --  13.7  PLT 238  --  185  --  201   < > = values in this interval not displayed.     BNPNo results for input(s): BNP, PROBNP in the last 168 hours.   DDimer No results for input(s): DDIMER in the last 168 hours.   Radiology    DG CHEST PORT 1 VIEW  Result Date: 03/15/2020 CLINICAL DATA:  Reason for exam: hx of open heart surgery EXAM: PORTABLE CHEST 1 VIEW COMPARISON:  Radiograph 03/14/2020 FINDINGS: Stable cardiac silhouette. There is bibasilar atelectasis. Chest tube at the RIGHT lung base. No pneumothorax. No significant pleural fluid. IMPRESSION: 1. Bibasilar atelectasis. 2. RIGHT chest tube in place without pneumothorax or significant pleural fluid Electronically Signed   By: Genevive Bi M.D.   On: 03/15/2020 09:54   DG CHEST PORT 1 VIEW  Result Date: 03/14/2020 CLINICAL DATA:  Surgery follow-up, chest tube, pneumothorax EXAM: PORTABLE CHEST 1 VIEW COMPARISON:  Portable exam 0829 hours compared to 03/13/2020 FINDINGS: RIGHT thoracostomy tube present. Enlargement of cardiac silhouette. Mediastinal contours and pulmonary vascularity normal. Bibasilar atelectasis and tiny LEFT pleural effusion. Tiny RIGHT apex pneumothorax. Osseous structures unremarkable. IMPRESSION: Bibasilar atelectasis and tiny LEFT pleural effusion. RIGHT thoracostomy tube with tiny RIGHT apex pneumothorax. Electronically Signed   By: Ulyses Southward M.D.   On: 03/14/2020 09:56    Cardiac Studies      Patient Profile     54 y.o. male witih recurrent right pleural effusion , atrial fib   Assessment & Plan  1.  Atrial fibrillation: Patient is back in atrial fibrillation.  His heart rate is well controlled.  He is not on anticoagulation at this point because of persistent indwelling chest tube. The plan is to rechallenge him with DOAC 2 weeks after his discharge. Echocardiogram has been ordered.  2.  Alcohol abuse: Apparently he drinks 10-12 beers a day.  This is likely a contributor to his paroxysmal atrial fibrillation.  He has been told to moderate his alcohol intake/discontinue his  alcohol intake.       For questions or updates, please contact [facility] Please consult [email or web address] for contact info under        Signed, Kristeen Miss, MD  03/15/2020, 10:05 AM

## 2020-03-15 NOTE — Progress Notes (Signed)
        [address]       [place] [address]             [phone]       3 Days Post-Op   Procedure(s) (LRB): ESOPHAGOGASTRODUODENOSCOPY (EGD) (N/A) XI ROBOTIC ASSISTED HIATAL HERNIA REPAIR USING ACELL MATRIX HIATAL (N/A) VIDEO ASSISTED THORACOSCOPY (VATS)/DRAINING OF AN EMPYEMA (Right) Subjective: Awake and alert. Says pain is controlled with morphine and tramadol.  Tolerating full liquids without nausea. BM x 3 past 24 hours.   Objective: Vital signs in last 24 hours: Temp:  [97.7 F (36.5 C)-98.1 F (36.7 C)] 97.9 F (36.6 C) (04/10 0701) Pulse Rate:  [79-140] 85 (04/10 0905) Cardiac Rhythm: Atrial fibrillation (04/10 0252) Resp:  [19-24] 19 (04/10 0701) BP: (129-163)/(95-125) 133/99 (04/10 0905) SpO2:  [97 %-98 %] 97 % (04/10 0829)  Hemodynamic parameters for last 24 hours:    Intake/Output from previous day: 04/09 0701 - 04/10 0700 In: 467.6 [P.O.:120; I.V.:347.6] Out: 682 [Urine:200; Chest Tube:482] Intake/Output this shift: No intake/output data recorded.  General appearance: alert, cooperative and no distress Neurologic: intact Heart: irregularly irregular rhythm. Monitor shows atrial fibrillation with controlled rate Lungs: breath sounds are clear.  Abdomen: moderate distension (says this is normal for him), active bowel sounds Wound: The chest incisions are intact and dry.  Lab Results: Recent Labs    03/13/20 0218 03/13/20 0218 03/13/20 0611 03/14/20 0130  WBC 9.0  --   --  10.8*  HGB 13.5   < > 12.9* 13.7  HCT 41.1   < > 38.0* 43.3  PLT 185  --   --  201   < > = values in this interval not displayed.   BMET:  Recent Labs    03/14/20 0550 03/15/20 0228  NA 136 135  K 4.5 4.4  CL 98 98  CO2 26 25  GLUCOSE 116* 98  BUN 25* 22*  CREATININE 1.56* 1.28*  CALCIUM 8.3* 8.5*    PT/INR: No results for input(s): LABPROT, INR in the last 72 hours. ABG    Component Value Date/Time   PHART 7.492 (H) 03/13/2020 0611   HCO3 24.7 03/13/2020 0611   TCO2 26 03/13/2020 0611   ACIDBASEDEF 4.0 (H) 03/12/2020 1539   O2SAT 99.0 03/13/2020 0611   CBG (last 3)  Recent Labs    03/13/20 2138  GLUCAP 113*    Assessment/Plan: S/P Procedure(s) (LRB): ESOPHAGOGASTRODUODENOSCOPY (EGD) (N/A) XI ROBOTIC ASSISTED HIATAL HERNIA REPAIR USING ACELL MATRIX HIATAL (N/A) VIDEO ASSISTED THORACOSCOPY (VATS)/DRAINING OF AN EMPYEMA (Right) -POD3 robot-assisted repair of hiatal hernia. Tolerating a full liquid diet. Still has significant drainage from the right pleural tube that Dr. 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 Started on Spironolactone last night.  Will place the pleural tube to water seal today and observe drainage at least 1 more day.Repeat CXR in AM.  Continue full  Liquids. Advance activity.   -Chronic atrial fibrillation- Rate controlled on oral amiodarone. Plan to anticoagulate with a NOAC when about 2 weeks post surgery.  Appreciate assistance from cardiology.   -History of hypertension- Moderate elevation over past 24 hours on metoprolol. Creat is below pre-op baseline of 1.4, will resume his lisinopril.   -DVT PPX- on [place] Lovenox daily   LOS: 3 days    Leary Roca, PA-C [phone] 03/15/2020

## 2020-03-15 NOTE — Plan of Care (Signed)
  Problem: Education: Goal: Ability to identify signs and symptoms of gastrointestinal bleeding will improve Outcome: Progressing   Problem: Fluid Volume: Goal: Will show no signs and symptoms of excessive bleeding Outcome: Progressing   Problem: Education: Goal: Knowledge of General Education information will improve Description: Including pain rating scale, medication(s)/side effects and non-pharmacologic comfort measures Outcome: Progressing   Problem: Health Behavior/Discharge Planning: Goal: Ability to manage health-related needs will improve Outcome: Progressing

## 2020-03-16 ENCOUNTER — Inpatient Hospital Stay (HOSPITAL_COMMUNITY): Payer: Medicaid Other

## 2020-03-16 LAB — BASIC METABOLIC PANEL
Anion gap: 11 (ref 5–15)
BUN: 18 mg/dL (ref 6–20)
CO2: 25 mmol/L (ref 22–32)
Calcium: 8.4 mg/dL — ABNORMAL LOW (ref 8.9–10.3)
Chloride: 97 mmol/L — ABNORMAL LOW (ref 98–111)
Creatinine, Ser: 1.09 mg/dL (ref 0.61–1.24)
GFR calc Af Amer: >60 mL/min (ref >60)
GFR calc non Af Amer: >60 mL/min (ref >60)
Glucose, Bld: 78 mg/dL (ref 70–99)
Potassium: 4.8 mmol/L (ref 3.5–5.1)
Sodium: 133 mmol/L — ABNORMAL LOW (ref 135–145)

## 2020-03-16 MED ORDER — FOLIC ACID 1 MG PO TABS: 1.0000 mg | ORAL_TABLET | Freq: Every day | ORAL | 1 refills | Status: DC

## 2020-03-16 MED ORDER — TRAMADOL HCL 50 MG PO TABS: 50.0000 mg | ORAL_TABLET | Freq: Four times a day (QID) | ORAL | 0 refills | Status: AC | PRN

## 2020-03-16 MED ORDER — SPIRONOLACTONE 25 MG PO TABS: 25.0000 mg | ORAL_TABLET | Freq: Every day | ORAL | 2 refills | Status: DC

## 2020-03-16 MED ORDER — ADULT MULTIVITAMIN W/MINERALS CH: 1.0000 | ORAL_TABLET | Freq: Every day | ORAL | 2 refills | Status: DC

## 2020-03-16 MED ORDER — LEVALBUTEROL HCL 0.63 MG/3ML IN NEBU: 0.6300 mg | INHALATION_SOLUTION | Freq: Four times a day (QID) | RESPIRATORY_TRACT | Status: DC | PRN

## 2020-03-16 MED ORDER — LISINOPRIL 20 MG PO TABS
40.0000 mg | ORAL_TABLET | Freq: Every day | ORAL | Status: DC
  Administered 2020-03-16: 40 mg via ORAL
  Filled 2020-03-16: qty 2

## 2020-03-16 MED ORDER — PNEUMOCOCCAL VAC POLYVALENT 25 MCG/0.5ML IJ INJ: 0.5000 mL | INJECTION | Freq: Once | INTRAMUSCULAR | 0 refills | Status: AC

## 2020-03-16 MED ORDER — AMIODARONE HCL 200 MG PO TABS: 200.0000 mg | ORAL_TABLET | Freq: Two times a day (BID) | ORAL | 1 refills | Status: DC

## 2020-03-16 NOTE — Progress Notes (Signed)
       [address]       [place] [address]             [phone]      4 Days Post-Op Procedure(s) (LRB): ESOPHAGOGASTRODUODENOSCOPY (EGD) (N/A) XI ROBOTIC ASSISTED HIATAL HERNIA REPAIR USING ACELL MATRIX HIATAL (N/A) VIDEO ASSISTED THORACOSCOPY (VATS)/DRAINING OF AN EMPYEMA (Right) Subjective: Feels good. Tolerating full liquids with no difficulty swallowing. Having some cough with thick secretions.  He as had several bowel movements.   Objective: Vital signs in last 24 hours: Temp:  [97.6 F (36.4 C)-98.6 F (37 C)] 97.9 F (36.6 C) (04/11 0716) Pulse Rate:  [61-98] 93 (04/11 0844) Cardiac Rhythm: Atrial fibrillation (04/11 0700) Resp:  [14-28] 22 (04/11 0716) BP: (126-146)/(89-111) 134/111 (04/11 0716) SpO2:  [93 %-99 %] 99 % (04/11 0716) FiO2 (%):  [97 %] 97 % (04/10 1450)  Hemodynamic parameters for last 24 hours:    Intake/Output from previous day: 04/10 0701 - 04/11 0700 In: 360 [P.O.:360] Out: 334 [Urine:3; Stool:1; Chest Tube:330] Intake/Output this shift: No intake/output data recorded.  General appearance: alert, cooperative and no distress Neurologic: intact Heart: irregularly irregular rhythm. Monitor shows atrial fibrillation with controlled rate Lungs: breath sounds are clear, CT drainage decreased to past 12 hours. Drainage is thin, serous fluid. CXR stable.   Abdomen: moderate distension is unchanged, active bowel sounds Wound: The chest incisions are intact and dry.   Lab Results: Recent Labs    03/14/20 0130  WBC 10.8*  HGB 13.7  HCT 43.3  PLT 201   BMET:  Recent Labs    03/15/20 0228 03/16/20 0445  NA 135 133*  K 4.4 4.8  CL 98 97*  CO2 25 25  GLUCOSE 98 78  BUN 22* 18  CREATININE 1.28* 1.09  CALCIUM 8.5* 8.4*    PT/INR: No results for input(s): LABPROT, INR in the last 72 hours. ABG    Component Value Date/Time   PHART 7.492 (H) 03/13/2020 0611   HCO3 24.7 03/13/2020 0611   TCO2 26 03/13/2020  0611   ACIDBASEDEF 4.0 (H) 03/12/2020 1539   O2SAT 99.0 03/13/2020 0611   CBG (last 3)  Recent Labs    03/13/20 2138  GLUCAP 113*    Assessment/Plan: S/P Procedure(s) (LRB): ESOPHAGOGASTRODUODENOSCOPY (EGD) (N/A) XI ROBOTIC ASSISTED HIATAL HERNIA REPAIR USING ACELL MATRIX HIATAL (N/A) VIDEO ASSISTED THORACOSCOPY (VATS)/DRAINING OF AN EMPYEMA (Right)  -POD4 robot-assisted repair of hiatal hernia. Tolerating a full liquid diet. Right pleural drainage has slowed. Dr. 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 Started on Spironolactone 4/10. Will remove the CT this morning and repeat CXR later today and if stable will plan to discharge to home.  -Chronic atrial fibrillation- Rate controlled on oral amiodarone. Plan to anticoagulate with a NOAC when about 2 weeks post surgery.  Appreciate assistance from cardiology.   -History of hypertension- Moderate elevation over past 24 hours on metoprolol and 1/2 of his usual dose of lisinopril. Creat is below pre-op baseline of 1.4, will increase to his full dose lisinopril.   -DVT PPX- on [place] Lovenox daily    LOS: 4 days    Leary Roca, PA-C [phone] 03/16/2020

## 2020-03-16 NOTE — Plan of Care (Signed)
  Problem: Bowel/Gastric: Goal: Will show no signs and symptoms of gastrointestinal bleeding Outcome: Progressing   Problem: Education: Goal: Knowledge of General Education information will improve Description: Including pain rating scale, medication(s)/side effects and non-pharmacologic comfort measures Outcome: Progressing   Problem: Health Behavior/Discharge Planning: Goal: Ability to manage health-related needs will improve Outcome: Progressing   Problem: Clinical Measurements: Goal: Will remain free from infection Outcome: Progressing Goal: Diagnostic test results will improve Outcome: Progressing Goal: Respiratory complications will improve Outcome: Progressing   Problem: Activity: Goal: Risk for activity intolerance will decrease Outcome: Progressing

## 2020-03-16 NOTE — Progress Notes (Signed)
Progress Note  Patient Name: Dean Anderson Date of Encounter: 03/16/2020  Primary Cardiologist: Kate Sable, MD    Subjective   54 year old gentleman with a history of alcoholism, GI bleeding, large hiatal hernia who is status post hiatal hernia repair.  He has had postoperative atrial fibrillation. He had VATS draining of an empyema with decortication.  Still in AF R chest tube has been removed.     Inpatient Medications    Scheduled Meds: . acetaminophen  1,000 mg Oral Q6H   Or  . acetaminophen (TYLENOL) oral liquid 160 mg/5 mL  1,000 mg Oral Q6H  . amiodarone  400 mg Oral BID  . bisacodyl  10 mg Oral Daily  . enoxaparin (LOVENOX) injection  40 mg Subcutaneous Daily  . folic acid  1 mg Oral Daily  . levothyroxine  88 mcg Oral Q0600  . lisinopril  40 mg Oral Daily  . mouth rinse  15 mL Mouth Rinse BID  . metoprolol tartrate  25 mg Oral BID  . multivitamin with minerals  1 tablet Oral Daily  . pantoprazole  40 mg Oral Daily  . senna-docusate  1 tablet Oral QHS  . spironolactone  25 mg Oral Daily  . thiamine  100 mg Oral Daily   Continuous Infusions:  PRN Meds: fluticasone, levalbuterol, morphine injection, ondansetron (ZOFRAN) IV, pneumococcal 23 valent vaccine, traMADol   Vital Signs    Vitals:   03/15/20 2353 03/16/20 0300 03/16/20 0716 03/16/20 0844  BP:  (!) 130/98 (!) 134/111   Pulse: 94 95  93  Resp: 15 14 (!) 22   Temp:  97.6 F (36.4 C) 97.9 F (36.6 C)   TempSrc:  Oral Oral   SpO2: 96% 97% 99%   Weight:      Height:        Intake/Output Summary (Last 24 hours) at 03/16/2020 1053 Last data filed at 03/16/2020 0900 Gross per 24 hour  Intake 360 ml  Output 314 ml  Net 46 ml   Last 3 Weights 03/13/2020 03/12/2020 03/10/2020  Weight (lbs) 215 lb 9.8 oz 213 lb 13.5 oz 213 lb 14.4 oz  Weight (kg) 97.8 kg 97 kg 97.024 kg      Telemetry    Atrial fib with controlled. V rate  Personally Reviewed  ECG      - Personally Reviewed  Physical  Exam   Physical Exam: Blood pressure (!) 134/111, pulse 93, temperature 97.9 F (36.6 C), temperature source Oral, resp. rate (!) 22, height 5\' 8"  (1.727 m), weight 97.8 kg, SpO2 99 %.  GEN:   Chronically ill appearing male,   HEENT: Normal NECK: No JVD; No carotid bruits LYMPHATICS: No lymphadenopathy CARDIAC: irreg. Irreg.  RESPIRATORY:  Reduced breath sounds right side  ABDOMEN: Soft, non-tender, non-distended MUSCULOSKELETAL:  No edema; No deformity  SKIN: Warm and dry NEUROLOGIC:  Alert and oriented x 3   Labs    High Sensitivity Troponin:  No results for input(s): TROPONINIHS in the last 720 hours.    Chemistry Recent Labs  Lab 03/10/20 0845 03/12/20 1443 03/14/20 0550 03/15/20 0228 03/16/20 0445  NA 131*   < > 136 135 133*  K 4.4   < > 4.5 4.4 4.8  CL 101   < > 98 98 97*  CO2 17*   < > 26 25 25   GLUCOSE 99   < > 116* 98 78  BUN 9   < > 25* 22* 18  CREATININE 0.77   < >  1.56* 1.28* 1.09  CALCIUM 9.0   < > 8.3* 8.5* 8.4*  PROT 7.0  --  6.1*  --   --   ALBUMIN 3.6  --  3.2*  --   --   AST 21  --  42*  --   --   ALT 20  --  50*  --   --   ALKPHOS 36*  --  31*  --   --   BILITOT 1.9*  --  2.0*  --   --   GFRNONAA >60   < > 51* >60 >60  GFRAA >60   < > 59* >60 >60  ANIONGAP 13   < > 12 12 11    < > = values in this interval not displayed.     Hematology Recent Labs  Lab 03/10/20 0845 03/12/20 1443 03/13/20 0218 03/13/20 0611 03/14/20 0130  WBC 6.8  --  9.0  --  10.8*  RBC 5.48  --  4.63  --  4.72  HGB 15.7   < > 13.5 12.9* 13.7  HCT 48.3   < > 41.1 38.0* 43.3  MCV 88.1  --  88.8  --  91.7  MCH 28.6  --  29.2  --  29.0  MCHC 32.5  --  32.8  --  31.6  RDW 12.8  --  13.5  --  13.7  PLT 238  --  185  --  201   < > = values in this interval not displayed.    BNPNo results for input(s): BNP, PROBNP in the last 168 hours.   DDimer No results for input(s): DDIMER in the last 168 hours.   Radiology    DG CHEST PORT 1 VIEW  Result Date:  03/16/2020 CLINICAL DATA:  Postop check. EXAM: PORTABLE CHEST 1 VIEW COMPARISON:  03/15/2020 FINDINGS: Right-sided chest tube unchanged. Lungs are hypoinflated with mild bibasilar opacification likely atelectasis and small amount left pleural fluid. Suggestion of tiny right apical pneumothorax. Cardiomediastinal silhouette and remainder the exam is unchanged. IMPRESSION: 1. Tiny right apical pneumothorax. Right-sided chest tube in place. 2. Stable mild bibasilar opacification likely bibasilar atelectasis with small amount left pleural fluid. Electronically Signed   By: 05/15/2020 M.D.   On: 03/16/2020 08:56   DG CHEST PORT 1 VIEW  Result Date: 03/15/2020 CLINICAL DATA:  Reason for exam: hx of open heart surgery EXAM: PORTABLE CHEST 1 VIEW COMPARISON:  Radiograph 03/14/2020 FINDINGS: Stable cardiac silhouette. There is bibasilar atelectasis. Chest tube at the RIGHT lung base. No pneumothorax. No significant pleural fluid. IMPRESSION: 1. Bibasilar atelectasis. 2. RIGHT chest tube in place without pneumothorax or significant pleural fluid Electronically Signed   By: 05/14/2020 M.D.   On: 03/15/2020 09:54    Cardiac Studies      Patient Profile     54 y.o. male witih recurrent right pleural effusion , atrial fib   Assessment & Plan    1.  Atrial fibrillation:  HR is well controlled.  The plan is to start anticoagulation when ok from a surgical standpoint .     2.  Alcohol abuse:  Stressed to the patient and his wife that he needs to stop drinking     3.  Empyema:  Further plans per surgery    For questions or updates, please contact [facility] Please consult [email or web address] for contact info under        Signed, 46, MD  03/16/2020, 10:53 AM

## 2020-03-16 NOTE — Plan of Care (Signed)
  Problem: Bowel/Gastric: Goal: Will show no signs and symptoms of gastrointestinal bleeding 03/16/2020 1436 by RoddySuzan Nailer, RN Outcome: Progressing 03/16/2020 0939 by Hetty Blend, RN Outcome: Progressing   Problem: Fluid Volume: Goal: Will show no signs and symptoms of excessive bleeding Outcome: Progressing   Problem: Education: Goal: Knowledge of General Education information will improve Description: Including pain rating scale, medication(s)/side effects and non-pharmacologic comfort measures 03/16/2020 1436 by Hetty Blend, RN Outcome: Progressing 03/16/2020 0939 by Hetty Blend, RN Outcome: Progressing   Problem: Health Behavior/Discharge Planning: Goal: Ability to manage health-related needs will improve 03/16/2020 1436 by Caniyah Murley, Suzan Nailer, RN Outcome: Progressing 03/16/2020 0939 by Hetty Blend, RN Outcome: Progressing   Problem: Clinical Measurements: Goal: Ability to maintain clinical measurements within normal limits will improve Outcome: Progressing Goal: Will remain free from infection Outcome: Progressing Goal: Diagnostic test results will improve Outcome: Progressing Goal: Respiratory complications will improve 03/16/2020 1436 by RoddySuzan Nailer, RN Outcome: Progressing 03/16/2020 0939 by Hetty Blend, RN Outcome: Progressing   Problem: Activity: Goal: Risk for activity intolerance will decrease Outcome: Progressing

## 2020-03-17 ENCOUNTER — Other Ambulatory Visit: Payer: Self-pay

## 2020-03-17 LAB — CULTURE, BODY FLUID W GRAM STAIN -BOTTLE: Culture: NO GROWTH

## 2020-03-18 ENCOUNTER — Encounter: Payer: Self-pay | Admitting: Thoracic Surgery (Cardiothoracic Vascular Surgery)

## 2020-03-20 ENCOUNTER — Other Ambulatory Visit: Payer: Self-pay | Admitting: Thoracic Surgery (Cardiothoracic Vascular Surgery)

## 2020-03-20 DIAGNOSIS — J90 Pleural effusion, not elsewhere classified: Secondary | ICD-10-CM

## 2020-03-21 ENCOUNTER — Other Ambulatory Visit: Payer: Self-pay

## 2020-03-21 ENCOUNTER — Ambulatory Visit
Admission: RE | Admit: 2020-03-21 | Discharge: 2020-03-21 | Disposition: A | Discharge status: Home / Self Care | Payer: Medicaid Other | Source: Ambulatory Visit | Attending: Thoracic Surgery (Cardiothoracic Vascular Surgery) | Admitting: Thoracic Surgery (Cardiothoracic Vascular Surgery)

## 2020-03-21 ENCOUNTER — Ambulatory Visit (INDEPENDENT_AMBULATORY_CARE_PROVIDER_SITE_OTHER): Payer: Self-pay | Admitting: Thoracic Surgery (Cardiothoracic Vascular Surgery)

## 2020-03-21 ENCOUNTER — Ambulatory Visit: Payer: Medicaid Other | Admitting: Cardiology

## 2020-03-21 ENCOUNTER — Encounter: Payer: Self-pay | Admitting: Thoracic Surgery (Cardiothoracic Vascular Surgery)

## 2020-03-21 VITALS — BP 160/97 | HR 62 | Temp 97.3°F | Resp 20 | Ht 68.0 in | Wt 211.5 lb

## 2020-03-21 DIAGNOSIS — I4819 Other persistent atrial fibrillation: Secondary | ICD-10-CM

## 2020-03-21 DIAGNOSIS — Z8719 Personal history of other diseases of the digestive system: Secondary | ICD-10-CM

## 2020-03-21 DIAGNOSIS — J90 Pleural effusion, not elsewhere classified: Secondary | ICD-10-CM

## 2020-03-21 DIAGNOSIS — K7031 Alcoholic cirrhosis of liver with ascites: Secondary | ICD-10-CM

## 2020-03-21 DIAGNOSIS — Z9889 Other specified postprocedural states: Secondary | ICD-10-CM

## 2020-03-21 NOTE — Progress Notes (Signed)
      [address]       [place] [address]             [phone]        [facility] Medical Record #[id number] Date of Birth: Jan 07, 1966  Referring: Nyoka Cowden, MD Primary Care: Danelle Berry, PA-C Primary Cardiologist:Brian Agbor-Etang, MD  Reason for visit:   follow-up  History of Present Illness:     Dean Anderson comes in for his first postoperative check.  Overall he states that he is doing much better regards to his respiratory status and swallowing.  He denies any shortness of breath, and states that food goes down much easier and he does not have any significant reflux.  Physical Exam: BP (!) 160/97 (BP Location: Right Arm)   Pulse 62   Temp (!) 97.3 F (36.3 C) (Temporal)   Resp 20   Ht 5\' 8"  (1.727 m)   Wt 211 lb 8 oz (95.9 kg)   SpO2 95% Comment: RA  BMI 32.16 kg/m   Alert NAD Decreased breath sounds on the right Incisions are well-healed Abdomen is soft      Assessment / Plan:   54 year old male status post paraesophageal hernia repair with ACell mesh, right VATS drainage of pleural effusion.  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  He is scheduled to follow-up with cardiology later today, and I have made an appointment for him to meet with gastroenterology over [facility] to discuss further management of his liver disease.  In regards to his paraesophageal hernia, he is clear to start advancing his diet but must still refrain from lifting anything heavier than 10 pounds for the next month.  He would like to return to work with limited activity.   [facility] 03/21/2020 12:02 PM

## 2020-03-26 ENCOUNTER — Ambulatory Visit: Payer: Medicaid Other | Admitting: Family Medicine

## 2020-03-26 ENCOUNTER — Encounter: Payer: Self-pay | Admitting: Family Medicine

## 2020-03-26 ENCOUNTER — Other Ambulatory Visit: Payer: Self-pay

## 2020-03-26 VITALS — BP 138/88 | HR 76 | Temp 98.1°F | Resp 14 | Ht 68.0 in | Wt 206.2 lb

## 2020-03-26 DIAGNOSIS — Z09 Encounter for follow-up examination after completed treatment for conditions other than malignant neoplasm: Secondary | ICD-10-CM | POA: Diagnosis not present

## 2020-03-26 DIAGNOSIS — I4819 Other persistent atrial fibrillation: Secondary | ICD-10-CM | POA: Diagnosis not present

## 2020-03-26 DIAGNOSIS — R7989 Other specified abnormal findings of blood chemistry: Secondary | ICD-10-CM

## 2020-03-26 DIAGNOSIS — I10 Essential (primary) hypertension: Secondary | ICD-10-CM

## 2020-03-26 DIAGNOSIS — E039 Hypothyroidism, unspecified: Secondary | ICD-10-CM

## 2020-03-26 LAB — COMPLETE METABOLIC PANEL WITH GFR
AG Ratio: 1.2 (calc) (ref 1.0–2.5)
ALT: 25 U/L (ref 9–46)
AST: 25 U/L (ref 10–35)
Albumin: 3.7 g/dL (ref 3.6–5.1)
Alkaline phosphatase (APISO): 90 U/L (ref 35–144)
BUN: 12 mg/dL (ref 7–25)
CO2: 25 mmol/L (ref 20–32)
Calcium: 9.1 mg/dL (ref 8.6–10.3)
Chloride: 101 mmol/L (ref 98–110)
Creat: 1 mg/dL (ref 0.70–1.33)
GFR, Est African American: 101 mL/min/{1.73_m2} (ref >=60)
GFR, Est Non African American: 87 mL/min/{1.73_m2} (ref >=60)
Globulin: 3 g/dL (calc) (ref 1.9–3.7)
Glucose, Bld: 92 mg/dL (ref 65–99)
Potassium: 5.1 mmol/L (ref 3.5–5.3)
Sodium: 134 mmol/L — ABNORMAL LOW (ref 135–146)
Total Bilirubin: 1.3 mg/dL — ABNORMAL HIGH (ref 0.2–1.2)
Total Protein: 6.7 g/dL (ref 6.1–8.1)

## 2020-03-26 NOTE — Progress Notes (Signed)
Patient ID: Dean Anderson, male    DOB: Apr 10, 1966, 54 y.o.   MRN: [id number]  PCP: Dean Berry, PA-C  Chief Complaint  Patient presents with  . Follow-up  . Hypertension    Subjective:   Dean Anderson is a 54 y.o. male, presents to clinic with CC of the following:  HPI   here for f/up on HTN that has been poorly controlled Also had surgery - multiple with CT surgeons and had chest tube for recurrent right sided pleural effusion, had surgery on hiatal hernia after no improvement to dyspnea after pleural effusion 2L drained. In hospital from 03/12/2020 to 03/16/2020  For HTN he is currently taking the following: Lisinopril 40 mg Stopped lasix put on spironolactone Metoprolol dose was increased to 100 mg BID  HR 56 with manual count  Put on amiodarone by cardiology  Hydralazine   Today blood pressure is well controlled 138/88, HR is a little slower than previous, with BB dose increase Recent visits BP was very elevated BP Readings from Last 6 Encounters:  03/26/20 138/88  03/21/20 (!) 160/97  03/16/20 (!) 134/111  03/10/20 (!) 140/110  02/29/20 123/74  readings are usually better at home. He continues to have improved breathing, less DOE   Reviewed his recent procedures and hospitalization, CT surgery, GI, pulmonary and cardiology visits Admit date: 03/12/2020 Discharge date: 03/16/2020 Admission Diagnoses:  Paraesophageal hernia Discharge Diagnoses:   History of repair of hiatal hernia Alcohol abuse [name] Right pleural effusion Iron dficiency anemia Hypertension Atrial fibrillation Acute gastritis  Since surgery he reports a lot of GI bloating, swollen, a lot of gassy feeling, he slowly advanced diet and recently had profuse watery diarrhea 4x since last night   Seeing GI tomorrow for assessment He had LFTs elevated and they suspected cirrhosis with hx of heavy alcohol drinking, quit two weeks ago 03/12/2020, none at all since sugery.  Pt feels  thyroid is stable, continues to take 88 mcg dose Lab Results  Component Value Date   TSH 3.11 02/19/2020  last labs were normal     Patient Active Problem List   Diagnosis Date Noted  . History of repair of hiatal hernia 03/12/2020  . Hyponatremia 02/20/2020  . Pleural effusion on right 01/11/2020  . Iron deficiency anemia due to chronic blood loss   . Acute gastritis without hemorrhage   . Hematochezia   . Hypertension 10/02/2018  . DOE (dyspnea on exertion) 10/02/2018  . Persistent atrial fibrillation (HCC) 09/21/2018      Current Outpatient Medications:  .  albuterol (VENTOLIN HFA) 108 (90 Base) MCG/ACT inhaler, Inhale 2 puffs into the lungs every 6 (six) hours as needed for wheezing or shortness of breath., Disp: , Rfl:  .  amiodarone (PACERONE) 200 MG tablet, Take 1 tablet (200 mg total) by mouth 2 (two) times daily., Disp: 60 tablet, Rfl: 1 .  fluticasone (FLONASE) 50 MCG/ACT nasal spray, Place 2 sprays into both nostrils as needed for allergies or rhinitis. (Patient taking differently: Place 2 sprays into both nostrils daily as needed for allergies or rhinitis. ), Disp: 16 g, Rfl: 5 .  folic acid (FOLVITE) 1 MG tablet, Take 1 tablet (1 mg total) by mouth daily., Disp: 30 tablet, Rfl: 1 .  hydrALAZINE (APRESOLINE) 25 MG tablet, Take 1-2 tablets (25-50 mg total) by mouth 3 (three) times daily as needed. Take for BP >150/90, Disp: 90 tablet, Rfl: 2 .  levothyroxine (SYNTHROID) 88 MCG tablet, Take 1 tablet (88 mcg  total) by mouth daily before breakfast., Disp: 90 tablet, Rfl: 1 .  lisinopril (ZESTRIL) 40 MG tablet, Take 1 tablet (40 mg total) by mouth daily., Disp: 90 tablet, Rfl: 3 .  metoprolol tartrate (LOPRESSOR) 25 MG tablet, Take 100 mg by mouth 2 (two) times daily., Disp: , Rfl:  .  omeprazole (PRILOSEC) 20 MG capsule, Take 1 capsule (20 mg total) by mouth at bedtime., Disp: 90 capsule, Rfl: 1 .  spironolactone (ALDACTONE) 25 MG tablet, Take 1 tablet (25 mg total) by mouth  daily., Disp: 30 tablet, Rfl: 2 .  furosemide (LASIX) 20 MG tablet, Take 1 tablet (20 mg total) by mouth daily. Take with potassium (Patient not taking: Reported on 03/26/2020), Disp: 30 tablet, Rfl: 3 .  ibuprofen (ADVIL) 200 MG tablet, Take 400 mg by mouth every 8 (eight) hours as needed (for pain.)., Disp: , Rfl:  .  Multiple Vitamin (MULTIVITAMIN WITH MINERALS) TABS tablet, Take 1 tablet by mouth daily. (Patient not taking: Reported on 03/26/2020), Disp: 100 tablet, Rfl: 2   No Known Allergies   Family History  Problem Relation Age of Onset  . Hypertension Mother   . Thyroid disease Mother   . Stroke Father 76  . Liver cancer Maternal Uncle   . Heart disease Brother   . Hypertension Brother      Social History   Socioeconomic History  . Marital status: Married    Spouse name: Dean Anderson  . Number of children: Not on file  . Years of education: 80  . Highest education level: Some college, no degree  Occupational History  . Not on file  Tobacco Use  . Smoking status: Never Smoker  . Smokeless tobacco: Never Used  Substance and Sexual Activity  . Alcohol use: Yes    Alcohol/week: 70.0 standard drinks    Types: 70 Cans of beer per week    Comment: 3-4  beers a day  . Drug use: Yes    Types: Marijuana    Comment: gummy, last week  . Sexual activity: Yes  Other Topics Concern  . Not on file  Social History Narrative   alcohol use.  Lives with his wife at home.   No lung dx hx, grew up with second hand smoke, wife smokes out of home   Social Determinants of Health   Financial Resource Strain:   . Difficulty of Paying Living Expenses:   Food Insecurity:   . Worried About Programme researcher, broadcasting/film/video in the Last Year:   . Barista in the Last Year:   Transportation Needs:   . Freight forwarder (Medical):   Marland Kitchen Lack of Transportation (Non-Medical):   Physical Activity:   . Days of Exercise per Week:   . Minutes of Exercise per Session:   Stress:   . Feeling of  Stress :   Social Connections:   . Frequency of Communication with Friends and Family:   . Frequency of Social Gatherings with Friends and Family:   . Attends Religious Services:   . Active Member of Clubs or Organizations:   . Attends Banker Meetings:   Marland Kitchen Marital Status:   Intimate Partner Violence:   . Fear of Current or Ex-Partner:   . Emotionally Abused:   Marland Kitchen Physically Abused:   . Sexually Abused:     Chart Review Today: I personally reviewed active problem list, medication list, allergies, family history, social history, health maintenance, notes from last encounter, lab results, imaging with the  patient/caregiver today. Reviewed labs with hospitalization, recent specialist OV and post op f/up  Review of Systems 10 Systems reviewed and are negative for acute change except as noted in the HPI.     Objective:   Vitals:   03/26/20 0845  BP: 138/88  Pulse: 76  Resp: 14  Temp: 98.1 F (36.7 C)  SpO2: 99%  Weight: 206 lb 3.2 oz (93.5 kg)  Height: 5\' 8"  (1.727 m)    Body mass index is 31.35 kg/m.  Physical Exam Vitals and nursing note reviewed.  Constitutional:      General: He is not in acute distress.    Appearance: Normal appearance. He is well-developed. He is obese. He is not ill-appearing, toxic-appearing or diaphoretic.     Interventions: Face mask in place.  HENT:     Head: Normocephalic and atraumatic.     Jaw: No trismus.     Right Ear: External ear normal.     Left Ear: External ear normal.  Eyes:     General: Lids are normal. No scleral icterus.    Conjunctiva/sclera: Conjunctivae normal.     Pupils: Pupils are equal, round, and reactive to light.  Neck:     Trachea: Trachea and phonation normal. No tracheal deviation.  Cardiovascular:     Rate and Rhythm: Bradycardia present. Rhythm irregular.     Pulses: Normal pulses.          Radial pulses are 2+ on the right side and 2+ on the left side.       Posterior tibial pulses are 2+ on  the right side and 2+ on the left side.     Heart sounds: Normal heart sounds. No murmur. No friction rub. No gallop.   Pulmonary:     Effort: Pulmonary effort is normal. No respiratory distress.     Breath sounds: Normal breath sounds. No stridor. No wheezing, rhonchi or rales.  Abdominal:     General: Bowel sounds are normal. There is no distension.     Palpations: Abdomen is soft.     Comments: protuberant  Musculoskeletal:     Cervical back: Normal range of motion and neck supple.     Right lower leg: No edema.     Left lower leg: No edema.  Skin:    General: Skin is warm and dry.     Coloration: Skin is not jaundiced.     Findings: No rash.     Nails: There is no clubbing.  Neurological:     Mental Status: He is alert.     Cranial Nerves: No dysarthria or facial asymmetry.     Motor: No weakness, tremor or abnormal muscle tone.     Coordination: Coordination normal.     Gait: Gait normal.  Psychiatric:        Mood and Affect: Mood normal.        Speech: Speech normal.        Behavior: Behavior normal. Behavior is cooperative.      Results for orders placed or performed during the hospital encounter of 03/12/20  Fungus Culture With Stain   Specimen: PATH Cytology Pleural fluid; Body Fluid  Result Value Ref Range   Fungus Stain Final report    Fungus (Mycology) Culture PENDING    Fungal Source FLUID   Acid Fast Smear (AFB)   Specimen: PATH Cytology Pleural fluid; Body Fluid  Result Value Ref Range   AFB Specimen Processing Concentration    Acid Fast Smear Negative  Source (AFB) FLUID   Culture, body fluid-bottle   Specimen: Fluid  Result Value Ref Range   Specimen Description FLUID PLEURAL RIGHT    Special Requests BOTTLES DRAWN AEROBIC AND ANAEROBIC    Culture      NO GROWTH 5 DAYS Performed at [facility], [address], [place], [place] [address]    Report Status 03/17/2020 FINAL   Gram stain   Specimen: Fluid  Result Value Ref Range   Specimen  Description FLUID PLEURAL RIGHT    Special Requests NONE    Gram Stain      MODERATE WBC PRESENT,BOTH PMN AND MONONUCLEAR NO ORGANISMS SEEN Performed at [facility], [address] [place], [place] [address]    Report Status 03/12/2020 FINAL   Fungus Culture Result  Result Value Ref Range   Result 1 Comment   CBC  Result Value Ref Range   WBC 9.0 4.0 - 10.5 K/uL   RBC 4.63 4.22 - 5.81 MIL/uL   Hemoglobin 13.5 13.0 - 17.0 g/dL   HCT 63.8 93.7 - 34.2 %   MCV 88.8 80.0 - 100.0 fL   MCH 29.2 26.0 - 34.0 pg   MCHC 32.8 30.0 - 36.0 g/dL   RDW 87.6 81.1 - 57.2 %   Platelets 185 150 - 400 K/uL   nRBC 0.0 0.0 - 0.2 %  Basic metabolic panel  Result Value Ref Range   Sodium 135 135 - 145 mmol/L   Potassium 4.8 3.5 - 5.1 mmol/L   Chloride 100 98 - 111 mmol/L   CO2 23 22 - 32 mmol/L   Glucose, Bld 141 (H) 70 - 99 mg/dL   BUN 17 6 - 20 mg/dL   Creatinine, Ser 6.20 (H) 0.61 - 1.24 mg/dL   Calcium 7.9 (L) 8.9 - 10.3 mg/dL   GFR calc non Af Amer 56 (L) >60 mL/min   GFR calc Af Amer >60 >60 mL/min   Anion gap 12 5 - 15  CBC  Result Value Ref Range   WBC 10.8 (H) 4.0 - 10.5 K/uL   RBC 4.72 4.22 - 5.81 MIL/uL   Hemoglobin 13.7 13.0 - 17.0 g/dL   HCT 35.5 97.4 - 16.3 %   MCV 91.7 80.0 - 100.0 fL   MCH 29.0 26.0 - 34.0 pg   MCHC 31.6 30.0 - 36.0 g/dL   RDW 84.5 36.4 - 68.0 %   Platelets 201 150 - 400 K/uL   nRBC 0.0 0.0 - 0.2 %  Glucose, capillary  Result Value Ref Range   Glucose-Capillary 113 (H) 70 - 99 mg/dL  Comprehensive metabolic panel  Result Value Ref Range   Sodium 136 135 - 145 mmol/L   Potassium 4.5 3.5 - 5.1 mmol/L   Chloride 98 98 - 111 mmol/L   CO2 26 22 - 32 mmol/L   Glucose, Bld 116 (H) 70 - 99 mg/dL   BUN 25 (H) 6 - 20 mg/dL   Creatinine, Ser 3.21 (H) 0.61 - 1.24 mg/dL   Calcium 8.3 (L) 8.9 - 10.3 mg/dL   Total Protein 6.1 (L) 6.5 - 8.1 g/dL   Albumin 3.2 (L) 3.5 - 5.0 g/dL   AST 42 (H) 15 - 41 U/L   ALT 50 (H) 0 - 44 U/L   Alkaline Phosphatase 31  (L) 38 - 126 U/L   Total Bilirubin 2.0 (H) 0.3 - 1.2 mg/dL   GFR calc non Af Amer 51 (L) >60 mL/min   GFR calc Af  Amer 59 (L) >60 mL/min   Anion gap 12 5 - 15  Basic metabolic panel  Result Value Ref Range   Sodium 135 135 - 145 mmol/L   Potassium 4.4 3.5 - 5.1 mmol/L   Chloride 98 98 - 111 mmol/L   CO2 25 22 - 32 mmol/L   Glucose, Bld 98 70 - 99 mg/dL   BUN 22 (H) 6 - 20 mg/dL   Creatinine, Ser 1.28 (H) 0.61 - 1.24 mg/dL   Calcium 8.5 (L) 8.9 - 10.3 mg/dL   GFR calc non Af Amer >60 >60 mL/min   GFR calc Af Amer >60 >60 mL/min   Anion gap 12 5 - 15  Basic metabolic panel  Result Value Ref Range   Sodium 133 (L) 135 - 145 mmol/L   Potassium 4.8 3.5 - 5.1 mmol/L   Chloride 97 (L) 98 - 111 mmol/L   CO2 25 22 - 32 mmol/L   Glucose, Bld 78 70 - 99 mg/dL   BUN 18 6 - 20 mg/dL   Creatinine, Ser 1.09 0.61 - 1.24 mg/dL   Calcium 8.4 (L) 8.9 - 10.3 mg/dL   GFR calc non Af Amer >60 >60 mL/min   GFR calc Af Amer >60 >60 mL/min   Anion gap 11 5 - 15  I-STAT 7, (LYTES, BLD GAS, ICA, H+H)  Result Value Ref Range   pH, Arterial 7.261 (L) 7.350 - 7.450   pCO2 arterial 48.0 32.0 - 48.0 mmHg   pO2, Arterial 155.0 (H) 83.0 - 108.0 mmHg   Bicarbonate 21.5 20.0 - 28.0 mmol/L   TCO2 23 22 - 32 mmol/L   O2 Saturation 99.0 %   Acid-base deficit 6.0 (H) 0.0 - 2.0 mmol/L   Sodium 133 (L) 135 - 145 mmol/L   Potassium 5.6 (H) 3.5 - 5.1 mmol/L   Calcium, Ion 1.10 (L) 1.15 - 1.40 mmol/L   HCT 46.0 39.0 - 52.0 %   Hemoglobin 15.6 13.0 - 17.0 g/dL   Patient temperature 37.2 C    Sample type ARTERIAL   I-STAT 7, (LYTES, BLD GAS, ICA, H+H)  Result Value Ref Range   pH, Arterial 7.353 7.350 - 7.450   pCO2 arterial 38.0 32.0 - 48.0 mmHg   pO2, Arterial 196.0 (H) 83.0 - 108.0 mmHg   Bicarbonate 21.1 20.0 - 28.0 mmol/L   TCO2 22 22 - 32 mmol/L   O2 Saturation 100.0 %   Acid-base deficit 4.0 (H) 0.0 - 2.0 mmol/L   Sodium 133 (L) 135 - 145 mmol/L   Potassium 5.4 (H) 3.5 - 5.1 mmol/L   Calcium, Ion  1.06 (L) 1.15 - 1.40 mmol/L   HCT 43.0 39.0 - 52.0 %   Hemoglobin 14.6 13.0 - 17.0 g/dL   Patient temperature 37.0 C    Sample type ARTERIAL   I-STAT 7, (LYTES, BLD GAS, ICA, H+H)  Result Value Ref Range   pH, Arterial 7.492 (H) 7.350 - 7.450   pCO2 arterial 32.2 32.0 - 48.0 mmHg   pO2, Arterial 133.0 (H) 83.0 - 108.0 mmHg   Bicarbonate 24.7 20.0 - 28.0 mmol/L   TCO2 26 22 - 32 mmol/L   O2 Saturation 99.0 %   Acid-Base Excess 2.0 0.0 - 2.0 mmol/L   Sodium 135 135 - 145 mmol/L   Potassium 4.5 3.5 - 5.1 mmol/L   Calcium, Ion 1.03 (L) 1.15 - 1.40 mmol/L   HCT 38.0 (L) 39.0 - 52.0 %   Hemoglobin 12.9 (L) 13.0 - 17.0 g/dL  Patient temperature 97.8 F    Sample type ARTERIAL   Cytology - Non PAP;  Result Value Ref Range   CYTOLOGY - NON GYN      CYTOLOGY - NON PAP CASE: [id number] PATIENT: Dean Anderson Non-Gynecological Cytology Report     Clinical History: Pleural Effusion Specimen Submitted:  B. PLEURAL FLUID, RIGHT, THORACENTESIS:   FINAL MICROSCOPIC DIAGNOSIS: - No malignant cells identified  SPECIMEN ADEQUACY: Satisfactory for evaluation  GROSS: Received is/are 35cc of red color fluid. (TC:tc) Smears:0 Concentration Method (ThinPrep): 1 Cell Block: 1 Conventional Additional Studies: N/A     Final Diagnosis performed by Pecola Leisure, MD.   Electronically signed 03/14/2020 Technical and / or Professional components performed at [facility]. [facility], [address], [place], [place] [address].  Immunohistochemistry Technical component (if applicable) was performed at [facility]. [address], [address], [place], [place] [address].   IMMUNOHISTOCHEMISTRY DISCLAIMER (if applicable): Some of these immunohistochemical stains may have been developed and the perform ance characteristics determine by [facility]. Some may not have been cleared or approved by the U.S. Food and Drug Administration. The FDA has determined that  such clearance or approval is not necessary. This test is used for clinical purposes. It should not be regarded as investigational or for research. This laboratory is certified under the Clinical Laboratory Improvement Amendments of 1988 (CLIA-88) as qualified to perform high complexity clinical laboratory testing.  The controls stained appropriately.         Assessment & Plan:      ICD-10-CM   1. Essential hypertension  I10 COMPLETE METABOLIC PANEL WITH GFR   well controlled today, multiple med changes by specialists, will recheck labs since hospitalization and med changes, no med changes today  2. Persistent atrial fibrillation (HCC)  I48.19    per cardiology, rate controlled, not on anticoagulant due to GI bleed last year  3. Elevated LFTs  R79.89 COMPLETE METABOLIC PANEL WITH GFR   LFTs elevated with recent hospitalization and procedures, recheck CMP today to recheck - pt seeing GI for f/up on suspected cirrhosis, hx of ETOH  4. Hypothyroidism, unspecified type  E03.9    stable, no change no labs, con't 88 mcg dose daily  5. Encounter for examination following treatment at hospital  Z09    extensive amount of time reviewing recent sugeries, hospitalizations, specialists and consults and multiple med changes        Dean Berry, PA-C 03/26/20 9:13 AM

## 2020-03-27 ENCOUNTER — Encounter: Payer: Self-pay | Admitting: Gastroenterology

## 2020-03-27 ENCOUNTER — Telehealth (INDEPENDENT_AMBULATORY_CARE_PROVIDER_SITE_OTHER): Payer: Medicaid Other | Admitting: Gastroenterology

## 2020-03-27 DIAGNOSIS — K7460 Unspecified cirrhosis of liver: Secondary | ICD-10-CM

## 2020-03-27 NOTE — Progress Notes (Signed)
Dean Antigua, MD [address]  [place]  [place], [place] [address]  Main: [phone]  Fax: [phone]   Primary Care Physician: Delsa Grana, PA-C  Virtual Visit via Video Note  I connected with patient on 03/27/20 at 11:30 AM EDT by video (using [email or web address]) and verified that I am speaking with the correct person using two identifiers.   I discussed the limitations, risks, security and privacy concerns of performing an evaluation and management service by video and the availability of in person appointments. I also discussed with the patient that there may be a patient responsible charge related to this service. The patient expressed understanding and agreed to proceed.  Location of Patient: Home Location of Provider: Home Persons involved: Patient and provider only (Nursing staff checked in patient via phone but were not physically involved in the video interaction - see their notes)   History of Present Illness: Chief Complaint  Patient presents with  . New Patient (Initial Visit)  . Cirrhosis    Denies any abdominal pain. Has some diarrhea     HPI: Dean Anderson is a 54 y.o. 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 pylori that was treated, Barrett's esophagus, hiatal hernia, presents for follow-up.  Since his last visit he had shortness of breath and was found to have a large right-sided pleural effusion and underwent thoracentesis and due to ongoing symptoms underwent right VATS procedure for drainage of pleural effusion, paraesophageal hernia repair on April 2021.  Previous history: previously seen in April 2020 due to iron deficiency anemia anemia and intermittent bright red blood per rectum on Eliquis.  May 2020 Upper endoscopy , Dr. Allen Norris 6 cm long segment Barrett's, biopsied every 2 cm Gastric erythema Hiatal hernia  Colonoscopy, Dr. Allen Norris, with nonbleeding internal hemorrhoids, otherwise normal  Pathology  showed H. 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 Carlean Purl with no etiology of iron deficiency anemia on small bowel exam  CT scan done by hematology oncology do not reveal any sources of iron deficiency anemia except for a large hiatal hernia for which she was referred to surgery  He also had mildly elevated liver enzymes and reported daily alcohol use.  He was advised to abstain from alcohol use.  Further viral and autoimmune hepatitis work-up was negative.  Right upper quadrant ultrasound indicated hepatic steatosis with no focal liver lesions  The patient denies abdominal or flank pain, anorexia, nausea or vomiting, dysphagia, heartburn, indigestion, change in bowel habits or black or bloody stools or weight loss.   Current Outpatient Medications  Medication Sig Dispense Refill  . albuterol (VENTOLIN HFA) 108 (90 Base) MCG/ACT inhaler Inhale 2 puffs into the lungs every 6 (six) hours as needed for wheezing or shortness of breath.    Marland Kitchen amiodarone (PACERONE) 200 MG tablet Take 1 tablet (200 mg total) by mouth 2 (two) times daily. 60 tablet 1  . fluticasone (FLONASE) 50 MCG/ACT nasal spray Place 2 sprays into both nostrils as needed for allergies or rhinitis. (Patient taking differently: Place 2 sprays into both nostrils daily as needed for allergies or rhinitis. ) 16 g 5  . folic acid (FOLVITE) 1 MG tablet Take 1 tablet (1 mg total) by mouth daily. 30 tablet 1  . hydrALAZINE (APRESOLINE) 25 MG tablet Take 1-2 tablets (25-50 mg total) by mouth 3 (three) times daily as needed. Take for BP >150/90 90 tablet 2  .  levothyroxine (SYNTHROID) 88 MCG tablet Take 1 tablet (88 mcg total) by mouth daily before breakfast. 90 tablet 1  . lisinopril (ZESTRIL) 40 MG tablet Take 1 tablet (40 mg total) by mouth daily. 90 tablet 3  . metoprolol tartrate (LOPRESSOR) 25 MG tablet Take 100 mg by mouth 2 (two) times daily.    Marland Kitchen omeprazole (PRILOSEC) 20 MG  capsule Take 1 capsule (20 mg total) by mouth at bedtime. 90 capsule 1  . spironolactone (ALDACTONE) 25 MG tablet Take 1 tablet (25 mg total) by mouth daily. 30 tablet 2  . ibuprofen (ADVIL) 200 MG tablet Take 400 mg by mouth every 8 (eight) hours as needed (for pain.).     No current facility-administered medications for this visit.    Allergies as of 03/27/2020  . (No Known Allergies)    Review of Systems:    All systems reviewed and negative except where noted in HPI.   Observations/Objective:  Labs: CMP     Component Value Date/Time   NA 134 (L) 03/26/2020 0937   NA 138 04/13/2019 1156   K 5.1 03/26/2020 0937   CL 101 03/26/2020 0937   CO2 25 03/26/2020 0937   GLUCOSE 92 03/26/2020 0937   BUN 12 03/26/2020 0937   BUN 8 04/13/2019 1156   CREATININE 1.00 03/26/2020 0937   CALCIUM 9.1 03/26/2020 0937   PROT 6.7 03/26/2020 0937   PROT 6.7 06/13/2019 1337   ALBUMIN 3.2 (L) 03/14/2020 0550   ALBUMIN 4.0 06/13/2019 1337   AST 25 03/26/2020 0937   ALT 25 03/26/2020 0937   ALKPHOS 31 (L) 03/14/2020 0550   BILITOT 1.3 (H) 03/26/2020 0937   BILITOT 1.4 (H) 06/13/2019 1337   GFRNONAA 87 03/26/2020 0937   GFRAA 101 03/26/2020 0937   Lab Results  Component Value Date   WBC 10.8 (H) 03/14/2020   HGB 13.7 03/14/2020   HCT 43.3 03/14/2020   MCV 91.7 03/14/2020   PLT 201 03/14/2020    Imaging Studies: DG Chest 2 View  Result Date: 03/21/2020 CLINICAL DATA:  Right pleural effusion. EXAM: CHEST - 2 VIEW COMPARISON:  03/16/2020 FINDINGS: Lungs are adequately inflated with bibasilar opacification likely bibasilar atelectasis with small right effusion. Left base opacification is slightly improved as the right effusion is slightly worse. Stable cardiomegaly. Remainder of the exam is unchanged. IMPRESSION: Slight improvement left base opacification with slight worsening right base opacification likely bibasilar atelectasis versus infection. Small right effusion slightly worse.  Electronically Signed   By: Elberta Fortis M.D.   On: 03/21/2020 11:29   DG Chest 2 View  Result Date: 03/16/2020 CLINICAL DATA:  Postop hernia surgery. EXAM: CHEST - 2 VIEW COMPARISON:  03/16/2020 FINDINGS: Lungs are somewhat hypoinflated with stable left base opacification likely atelectasis and possible small amount of pleural fluid. Interval removal right chest tube. No definite right-sided pneumothorax visualized. Mild stable cardiomegaly. Remainder of the exam is unchanged. IMPRESSION: 1. Stable left base opacification likely atelectasis with possible small amount of pleural fluid. 2. Interval removal right chest tube. No definite right apical pneumothorax. Electronically Signed   By: Elberta Fortis M.D.   On: 03/16/2020 15:24   DG Chest 2 View  Result Date: 03/10/2020 CLINICAL DATA:  Pre-surgical evaluation for hernia repair. History of recurrent right pleural effusion. EXAM: CHEST - 2 VIEW COMPARISON:  02/29/2020 FINDINGS: Hiatal hernia as seen previously. Mild atelectasis at the left base secondary to that. No sign of left effusion. Moderate sized right effusion layering dependently. This is slightly  larger than was seen on the previous exam, associated with right base atelectasis. Upper lungs are clear. No significant bone finding. IMPRESSION: Chronic right pleural effusion is larger than was seen 2 weeks ago. Associated right base atelectasis. Chronic hiatal hernia. Electronically Signed   By: Paulina Fusi M.D.   On: 03/10/2020 09:08   DG Chest 2 View  Result Date: 02/29/2020 CLINICAL DATA:  Follow-up pleural effusion EXAM: CHEST - 2 VIEW COMPARISON:  02/20/2019 FINDINGS: Cardiac shadow is mildly enlarged but stable. Hiatal hernia is again noted. Small right-sided pleural effusion is again seen with underlying atelectasis. The overall appearance is similar to that seen on the prior exam. IMPRESSION: Right pleural effusion stable from the prior study. No new focal abnormality is seen.  Electronically Signed   By: Alcide Clever M.D.   On: 02/29/2020 13:44   DG CHEST PORT 1 VIEW  Result Date: 03/16/2020 CLINICAL DATA:  Postop check. EXAM: PORTABLE CHEST 1 VIEW COMPARISON:  03/15/2020 FINDINGS: Right-sided chest tube unchanged. Lungs are hypoinflated with mild bibasilar opacification likely atelectasis and small amount left pleural fluid. Suggestion of tiny right apical pneumothorax. Cardiomediastinal silhouette and remainder the exam is unchanged. IMPRESSION: 1. Tiny right apical pneumothorax. Right-sided chest tube in place. 2. Stable mild bibasilar opacification likely bibasilar atelectasis with small amount left pleural fluid. Electronically Signed   By: Elberta Fortis M.D.   On: 03/16/2020 08:56   DG CHEST PORT 1 VIEW  Result Date: 03/15/2020 CLINICAL DATA:  Reason for exam: hx of open heart surgery EXAM: PORTABLE CHEST 1 VIEW COMPARISON:  Radiograph 03/14/2020 FINDINGS: Stable cardiac silhouette. There is bibasilar atelectasis. Chest tube at the RIGHT lung base. No pneumothorax. No significant pleural fluid. IMPRESSION: 1. Bibasilar atelectasis. 2. RIGHT chest tube in place without pneumothorax or significant pleural fluid Electronically Signed   By: Genevive Bi M.D.   On: 03/15/2020 09:54   DG CHEST PORT 1 VIEW  Result Date: 03/14/2020 CLINICAL DATA:  Surgery follow-up, chest tube, pneumothorax EXAM: PORTABLE CHEST 1 VIEW COMPARISON:  Portable exam 0829 hours compared to 03/13/2020 FINDINGS: RIGHT thoracostomy tube present. Enlargement of cardiac silhouette. Mediastinal contours and pulmonary vascularity normal. Bibasilar atelectasis and tiny LEFT pleural effusion. Tiny RIGHT apex pneumothorax. Osseous structures unremarkable. IMPRESSION: Bibasilar atelectasis and tiny LEFT pleural effusion. RIGHT thoracostomy tube with tiny RIGHT apex pneumothorax. Electronically Signed   By: Ulyses Southward M.D.   On: 03/14/2020 09:56   DG CHEST PORT 1 VIEW  Result Date: 03/13/2020 CLINICAL  DATA:  Status post hiatal hernia repair. EXAM: PORTABLE CHEST 1 VIEW COMPARISON:  03/12/2020 FINDINGS: Right-sided chest tube is stable. No pneumothorax. Stable cardiac enlargement and bibasilar atelectasis. No definite pleural effusions. IMPRESSION: 1. Stable right-sided chest tube without pneumothorax. 2. Bibasilar atelectasis. Electronically Signed   By: Rudie Meyer M.D.   On: 03/13/2020 09:02   DG Chest Port 1 View  Result Date: 03/12/2020 CLINICAL DATA:  Chest tube, postop EXAM: PORTABLE CHEST 1 VIEW COMPARISON:  03/10/2020 FINDINGS: Right chest tube has been placed with decreasing right pleural effusion. No pneumothorax. Cardiomegaly. Large hiatal hernia noted. Vascular congestion. No overt edema. IMPRESSION: Interval placement of right chest tube with decreasing right pleural effusion. No pneumothorax. Cardiomegaly, vascular congestion. Large hiatal hernia. Electronically Signed   By: Charlett Nose M.D.   On: 03/12/2020 18:15   DG ESOPHAGUS W SINGLE CM (SOL OR THIN BA)  Result Date: 03/13/2020 CLINICAL DATA:  Patient underwent repair of a hiatal hernia on 03/12/2020. Evaluate for repair integrity.  EXAM: ESOPHOGRAM/BARIUM SWALLOW TECHNIQUE: Single contrast examination was performed using water-soluble contrast. FLUOROSCOPY TIME:  Fluoroscopy Time:  48 seconds Radiation Exposure Index (if provided by the fluoroscopic device): 42.8 mGy Number of Acquired Spot Images: 8 COMPARISON:  None. FINDINGS: Esophagus is normal in course and in caliber. Gastroesophageal junction is patent with contrast entering the stomach. There is no extravasation of contrast to indicate a postoperative leak. No reflux was noted during the study. No hiatal hernia. IMPRESSION: 1. Repaired hiatal hernia. No evidence of a postoperative leak. No reflux or significant narrowing at the gastroesophageal junction. Electronically Signed   By: Amie Portland M.D.   On: 03/13/2020 08:49    Assessment and Plan:   Dean Anderson is a 54  y.o. 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 pylori, status post treatment, recent VATS procedure due to pleural effusion and paraesophageal hernia repair, presents for follow-up  Assessment and Plan: Recent labs showed normal hemoglobin and liver enzymes are at baseline with mildly elevated total bilirubin.  Patient recommended to to come in to clinic for physical exam given his recent pleural effusions and need for abdominal exam in person  EGD for variceal screening otherwise up-to-date at this time  Continue follow-up with hematology for iron deficiency  Order H. pylori eradication testing on next visit as patient did not get this done in July 2020 when it was ordered  Continue omeprazole for Barrett's esophagus  Follow Up Instructions:    I discussed the assessment and treatment plan with the patient. The patient was provided an opportunity to ask questions and all were answered. The patient agreed with the plan and demonstrated an understanding of the instructions.   The patient was advised to call back or seek an in-person evaluation if the symptoms worsen or if the condition fails to improve as anticipated.  I provided 15 minutes of face-to-face time via video software during this encounter. Additional time was spent in reviewing patient's chart, placing orders etc.   Pasty Spillers, MD  Speech recognition software was used to dictate this note.

## 2020-03-28 ENCOUNTER — Ambulatory Visit (INDEPENDENT_AMBULATORY_CARE_PROVIDER_SITE_OTHER): Payer: Medicaid Other | Admitting: Cardiology

## 2020-03-28 ENCOUNTER — Encounter: Payer: Self-pay | Admitting: Cardiology

## 2020-03-28 ENCOUNTER — Other Ambulatory Visit: Payer: Self-pay

## 2020-03-28 ENCOUNTER — Ambulatory Visit: Payer: Medicaid Other | Admitting: Cardiology

## 2020-03-28 VITALS — BP 170/90 | HR 66 | Ht 68.0 in | Wt 205.0 lb

## 2020-03-28 DIAGNOSIS — I4819 Other persistent atrial fibrillation: Secondary | ICD-10-CM | POA: Diagnosis not present

## 2020-03-28 DIAGNOSIS — R0600 Dyspnea, unspecified: Secondary | ICD-10-CM | POA: Diagnosis not present

## 2020-03-28 DIAGNOSIS — I10 Essential (primary) hypertension: Secondary | ICD-10-CM

## 2020-03-28 DIAGNOSIS — R0609 Other forms of dyspnea: Secondary | ICD-10-CM

## 2020-03-28 MED ORDER — HYDROCHLOROTHIAZIDE 25 MG PO TABS: 25.0000 mg | ORAL_TABLET | Freq: Every day | ORAL | 3 refills | Status: DC

## 2020-03-28 NOTE — Patient Instructions (Signed)
Medication Instructions:  Your physician has recommended you make the following change in your medication:  1. STOP Amiodarone 2. START Hydrochlorothiazide 25 mg once daily   *If you need a refill on your cardiac medications before your next appointment, please call your pharmacy*   Lab Work: None  If you have labs (blood work) drawn today and your tests are completely normal, you will receive your results only by: Marland Kitchen MyChart Message (if you have MyChart) OR . A paper copy in the mail If you have any lab test that is abnormal or we need to change your treatment, we will call you to review the results.   Testing/Procedures: None   Follow-Up: At [facility], you and your health needs are our priority.  As part of our continuing mission to provide you with exceptional heart care, we have created designated Provider Care Teams.  These Care Teams include your primary Cardiologist (physician) and Advanced Practice Providers (APPs -  Physician Assistants and Nurse Practitioners) who all work together to provide you with the care you need, when you need it.  Your next appointment:   2 month(s)  The format for your next appointment:   In Person  Provider:    You may see Debbe Odea, MD or one of the following Advanced Practice Providers on your designated Care Team:    Nicolasa Ducking, NP  Eula Listen, PA-C  Marisue Ivan, PA-C

## 2020-03-28 NOTE — Progress Notes (Signed)
Cardiology Office Note:    Date:  03/28/2020   ID:  Dean Anderson, DOB Jul 15, 1966, MRN [id number]  PCP:  Dean Grana, PA-C  Cardiologist:  Dean Sable, MD  Electrophysiologist:  None   Referring MD: Dean Grana, PA-C   Chief Complaint  Patient presents with  . Other    hospital follow up for right side PE. Meds reviewed verbally with patient.     History of Present Illness:    Dean Anderson is a 54 y.o. male with a hx of persistent A. fib, hypertension who presents for follow-up.  Last seen with dyspnea on exertion and pleural effusion.  He went to the hospital where he had VATS draining of empyema with decortication and hiatal hernia repair.  Patient not on anticoagulants due to severe anemia.  GI work-up has been unrevealing.  Echocardiogram was scheduled and pending.  Lasix was stopped upon discharge from the hospital.  He has appointments to see gastroenterology in a few weeks.  He states feeling better with regards to shortness of breath.  Has not noticed any more blood in his stools.  Takes his blood pressures at home which is usually 130s.  Historical notes He was diagnosed with atrial fibrillation about a year and a half ago.  He was previously put on blood thinners but subsequently developed an anemia.  GI work-up has since been unrevealing.  He states noticing blood in his stool sometimes.   Past Medical History:  Diagnosis Date  . Anemia   . Atrial fibrillation ([place])   . Dyspnea   . Family history of adverse reaction to anesthesia    brother woke up in a rage after geting anesthesia for teeth surgery  . GERD (gastroesophageal reflux disease)   . History of hiatal hernia   . Hypertension   . Hypothyroidism   . Thyroid disease     Past Surgical History:  Procedure Laterality Date  . COLONOSCOPY WITH PROPOFOL N/A 04/17/2019   Procedure: COLONOSCOPY WITH PROPOFOL;  Surgeon: Dean Lame, MD;  Location: [facility] ENDOSCOPY;  Service: Endoscopy;  Laterality: N/A;  .  ESOPHAGOGASTRODUODENOSCOPY N/A 03/12/2020   Procedure: ESOPHAGOGASTRODUODENOSCOPY (EGD);  Surgeon: Dean Matte, MD;  Location: [facility] OR;  Service: Thoracic;  Laterality: N/A;  . ESOPHAGOGASTRODUODENOSCOPY (EGD) WITH PROPOFOL N/A 04/17/2019   Procedure: ESOPHAGOGASTRODUODENOSCOPY (EGD) WITH PROPOFOL;  Surgeon: Dean Lame, MD;  Location: [facility] ENDOSCOPY;  Service: Endoscopy;  Laterality: N/A;  . GIVENS CAPSULE STUDY  05/2019   negative  . VIDEO ASSISTED THORACOSCOPY (VATS)/DECORTICATION Right 03/12/2020   Procedure: VIDEO ASSISTED THORACOSCOPY (VATS)/DRAINING OF AN EMPYEMA;  Surgeon: Dean Matte, MD;  Location: [place];  Service: Thoracic;  Laterality: Right;  . XI ROBOTIC ASSISTED HIATAL HERNIA REPAIR N/A 03/12/2020   Procedure: XI ROBOTIC ASSISTED HIATAL HERNIA REPAIR USING ACELL MATRIX HIATAL;  Surgeon: Dean Matte, MD;  Location: [facility] OR;  Service: Thoracic;  Laterality: N/A;    Current Medications: Current Meds  Medication Sig  . albuterol (VENTOLIN HFA) 108 (90 Base) MCG/ACT inhaler Inhale 2 puffs into the lungs every 6 (six) hours as needed for wheezing or shortness of breath.  . fluticasone (FLONASE) 50 MCG/ACT nasal spray Place 2 sprays into both nostrils as needed for allergies or rhinitis. (Patient taking differently: Place 2 sprays into both nostrils daily as needed for allergies or rhinitis. )  . folic acid (FOLVITE) 1 MG tablet Take 1 tablet (1 mg total) by mouth daily.  . hydrALAZINE (APRESOLINE) 25 MG tablet Take 1-2 tablets (25-50 mg  total) by mouth 3 (three) times daily as needed. Take for BP >150/90  . ibuprofen (ADVIL) 200 MG tablet Take 400 mg by mouth every 8 (eight) hours as needed (for pain.).  Marland Kitchen levothyroxine (SYNTHROID) 88 MCG tablet Take 1 tablet (88 mcg total) by mouth daily before breakfast.  . lisinopril (ZESTRIL) 40 MG tablet Take 1 tablet (40 mg total) by mouth daily.  . metoprolol tartrate (LOPRESSOR) 25 MG tablet Take 100 mg by mouth 2 (two) times  daily.  Marland Kitchen omeprazole (PRILOSEC) 20 MG capsule Take 1 capsule (20 mg total) by mouth at bedtime.  Marland Kitchen spironolactone (ALDACTONE) 25 MG tablet Take 1 tablet (25 mg total) by mouth daily.  . [DISCONTINUED] amiodarone (PACERONE) 200 MG tablet Take 1 tablet (200 mg total) by mouth 2 (two) times daily.     Allergies:   Patient has no known allergies.   Social History   Socioeconomic History  . Marital status: Married    Spouse name: Dean Anderson  . Number of children: Not on file  . Years of education: 66  . Highest education level: Some college, no degree  Occupational History  . Not on file  Tobacco Use  . Smoking status: Never Smoker  . Smokeless tobacco: Never Used  Substance and Sexual Activity  . Alcohol use: Not Currently    Comment: quit ETOH 03/12/2020  . Drug use: Yes    Types: Marijuana    Comment: gummy, last week  . Sexual activity: Yes  Other Topics Concern  . Not on file  Social History Narrative   alcohol use- cut back April 2021.  Lives with his wife at home.   No lung dx hx, grew up with second hand smoke, wife smokes out of home   Social Determinants of Health   Financial Resource Strain:   . Difficulty of Paying Living Expenses:   Food Insecurity:   . Worried About Programme researcher, broadcasting/film/video in the Last Year:   . Barista in the Last Year:   Transportation Needs:   . Freight forwarder (Medical):   Marland Kitchen Lack of Transportation (Non-Medical):   Physical Activity:   . Days of Exercise per Week:   . Minutes of Exercise per Session:   Stress:   . Feeling of Stress :   Social Connections:   . Frequency of Communication with Friends and Family:   . Frequency of Social Gatherings with Friends and Family:   . Attends Religious Services:   . Active Member of Clubs or Organizations:   . Attends Banker Meetings:   Marland Kitchen Marital Status:      Family History: The patient's family history includes Heart disease in his brother; Hypertension in his brother  and mother; Liver cancer in his maternal uncle; Stroke (age of onset: 68) in his father; Thyroid disease in his mother.  ROS:   Please see the history of present illness.     All other systems reviewed and are negative.  EKGs/Labs/Other Studies Reviewed:    The following studies were reviewed today:   EKG:  EKG is  ordered today.  The ekg ordered today demonstrates atrial fibrillation, heart rate 66  Recent Labs: 01/07/2020: B Natriuretic Peptide 156.0 02/19/2020: TSH 3.11 03/14/2020: Hemoglobin 13.7; Platelets 201 03/26/2020: ALT 25; BUN 12; Creat 1.00; Potassium 5.1; Sodium 134  Recent Lipid Panel    Component Value Date/Time   CHOL 181 12/27/2019 1215   TRIG 79 12/27/2019 1215   HDL 52 12/27/2019  1215   CHOLHDL 3.5 12/27/2019 1215   LDLCALC 112 (H) 12/27/2019 1215    Physical Exam:    VS:  BP (!) 170/90 (BP Location: Left Arm, Patient Position: Sitting, Cuff Size: Normal)   Pulse 66   Ht 5\' 8"  (1.727 m)   Wt 205 lb (93 kg)   BMI 31.17 kg/m     Wt Readings from Last 3 Encounters:  03/28/20 205 lb (93 kg)  03/26/20 206 lb 3.2 oz (93.5 kg)  03/21/20 211 lb 8 oz (95.9 kg)     GEN:  Well nourished, well developed in no acute distress HEENT: Normal NECK: No JVD; No carotid bruits LYMPHATICS: No lymphadenopathy CARDIAC: Irregular irregular, no murmurs, rubs, gallops RESPIRATORY: Decreased breath sounds at the right lung base, clear anteriorly ABDOMEN: Soft, non-tender, non-distended MUSCULOSKELETAL:  No edema; No deformity  SKIN: Warm and dry NEUROLOGIC:  Alert and oriented x 3 PSYCHIATRIC:  Normal affect   ASSESSMENT:    1. Persistent atrial fibrillation (HCC)   2. DOE (dyspnea on exertion)   3. Essential hypertension    PLAN:    In order of problems listed above:  1. Patient with persistent atrial fibrillation.  CHA2DS2-VASc: 1(htn).  Intolerable to anticoagulants with severe anemia.  GI work-up is so far been unrevealing.  Continue Lopressor 100 mg twice  daily , stop amiodarone as patient is in atrial fibrillation.  Get echocardiogram as scheduled.  If there is no evidence of bleeding or source of bleeding is identified and treated, then will consider anticoagulants. 2. Patient with dyspnea on exertion which is improving after right pleural effusion drainage. 3. History of hypertension, blood pressure elevated.  HCTZ 25 mg daily to BP regimen.  Follow-up in 2 months after echo  This note was generated in part or whole with voice recognition software. Voice recognition is usually quite accurate but there are transcription errors that can and very often do occur. I apologize for any typographical errors that were not detected and corrected.  Medication Adjustments/Labs and Tests Ordered: Current medicines are reviewed at length with the patient today.  Concerns regarding medicines are outlined above.  Orders Placed This Encounter  Procedures  . EKG 12-Lead   Meds ordered this encounter  Medications  . hydrochlorothiazide (HYDRODIURIL) 25 MG tablet    Sig: Take 1 tablet (25 mg total) by mouth daily.    Dispense:  90 tablet    Refill:  3    Patient Instructions  Medication Instructions:  Your physician has recommended you make the following change in your medication:  1. STOP Amiodarone 2. START Hydrochlorothiazide 25 mg once daily   *If you need a refill on your cardiac medications before your next appointment, please call your pharmacy*   Lab Work: None  If you have labs (blood work) drawn today and your tests are completely normal, you will receive your results only by: 03/23/20 MyChart Message (if you have MyChart) OR . A paper copy in the mail If you have any lab test that is abnormal or we need to change your treatment, we will call you to review the results.   Testing/Procedures: None   Follow-Up: At [facility], you and your health needs are our priority.  As part of our continuing mission to provide you with exceptional  heart care, we have created designated Provider Care Teams.  These Care Teams include your primary Cardiologist (physician) and Advanced Practice Providers (APPs -  Physician Assistants and Nurse Practitioners) who all work together  to provide you with the care you need, when you need it.  Your next appointment:   2 month(s)  The format for your next appointment:   In Person  Provider:    You may see Debbe Odea, MD or one of the following Advanced Practice Providers on your designated Care Team:    Nicolasa Ducking, NP  Eula Listen, PA-C  Marisue Ivan, PA-C      Signed, Debbe Odea, MD  03/28/2020 4:21 PM    [facility]

## 2020-04-07 ENCOUNTER — Ambulatory Visit: Payer: Medicaid Other | Admitting: Surgery

## 2020-04-07 ENCOUNTER — Telehealth: Payer: Self-pay | Admitting: Emergency Medicine

## 2020-04-07 NOTE — Telephone Encounter (Signed)
Called pt in regards to his appt this afternoon with Dr Everlene Farrier. Reading from his chart patient already had sx on 03/12/20. There is no need for pt to come in to "discuss sx" if he already had sx. Per patient he is doing okay, no fevers, N, V. Adcised pt about cancelling appt he agrees and has no further concerns.

## 2020-04-14 ENCOUNTER — Ambulatory Visit: Payer: Medicaid Other | Admitting: Surgery

## 2020-04-15 LAB — FUNGUS CULTURE WITH STAIN

## 2020-04-15 LAB — FUNGAL ORGANISM REFLEX

## 2020-04-15 LAB — FUNGUS CULTURE RESULT

## 2020-04-21 ENCOUNTER — Ambulatory Visit (INDEPENDENT_AMBULATORY_CARE_PROVIDER_SITE_OTHER): Payer: Medicaid Other

## 2020-04-21 ENCOUNTER — Other Ambulatory Visit: Payer: Self-pay

## 2020-04-21 DIAGNOSIS — I4819 Other persistent atrial fibrillation: Secondary | ICD-10-CM | POA: Diagnosis not present

## 2020-04-21 DIAGNOSIS — R0600 Dyspnea, unspecified: Secondary | ICD-10-CM | POA: Diagnosis not present

## 2020-04-21 DIAGNOSIS — J90 Pleural effusion, not elsewhere classified: Secondary | ICD-10-CM

## 2020-04-21 DIAGNOSIS — R0609 Other forms of dyspnea: Secondary | ICD-10-CM

## 2020-04-24 ENCOUNTER — Other Ambulatory Visit: Payer: Self-pay

## 2020-04-24 ENCOUNTER — Ambulatory Visit (INDEPENDENT_AMBULATORY_CARE_PROVIDER_SITE_OTHER): Payer: Medicaid Other | Admitting: Cardiology

## 2020-04-24 ENCOUNTER — Encounter: Payer: Self-pay | Admitting: Cardiology

## 2020-04-24 VITALS — BP 100/80 | HR 75 | Ht 68.0 in | Wt 195.0 lb

## 2020-04-24 DIAGNOSIS — I10 Essential (primary) hypertension: Secondary | ICD-10-CM

## 2020-04-24 DIAGNOSIS — I4819 Other persistent atrial fibrillation: Secondary | ICD-10-CM | POA: Diagnosis not present

## 2020-04-24 NOTE — Progress Notes (Signed)
Cardiology Office Note:    Date:  04/24/2020   ID:  Dean Anderson, DOB 12-May-1966, MRN [id number]  PCP:  Delsa Grana, PA-C  Cardiologist:  Kate Sable, MD  Electrophysiologist:  None   Referring MD: Delsa Grana, PA-C   Chief Complaint  Patient presents with  . office visit    Pt has no complaints. Meds verbally reviewed w/ pt.     History of Present Illness:    Dean Anderson is a 54 y.o. male with a hx of persistent A. fib, hypertension who presents for follow-up.  During last visit, blood pressures were noted to be elevated.  HCTZ was added to his blood pressure regimen.  Echocardiogram was also performed.  He has a history of GI bleeding and hiatal hernia.  He states not noticing any more blood in his stools thinks his surgery.  Has a follow-up appointment with gastroenterology in 4 days.  He has also felt much better from a breathing standpoint since his decortication for empyema.  He walked from the parking lot to the office without any shortness of breath.  His blood pressures have been well controlled since starting hydrochlorothiazide.   Historical notes He was diagnosed with atrial fibrillation about a year and a half ago.  He was previously put on blood thinners but subsequently developed an anemia.  GI work-up has since been unrevealing.  He states noticing blood in his stool sometimes.  History of dyspnea status post VATS draining of empyema and decortication, hiatal hernia repair.  Shortness of breath improved after surgery.  Past Medical History:  Diagnosis Date  . Anemia   . Atrial fibrillation ([place])   . Dyspnea   . Family history of adverse reaction to anesthesia    brother woke up in a rage after geting anesthesia for teeth surgery  . GERD (gastroesophageal reflux disease)   . History of hiatal hernia   . Hypertension   . Hypothyroidism   . Thyroid disease     Past Surgical History:  Procedure Laterality Date  . COLONOSCOPY WITH PROPOFOL N/A 04/17/2019     Procedure: COLONOSCOPY WITH PROPOFOL;  Surgeon: Lucilla Lame, MD;  Location: [facility] ENDOSCOPY;  Service: Endoscopy;  Laterality: N/A;  . ESOPHAGOGASTRODUODENOSCOPY N/A 03/12/2020   Procedure: ESOPHAGOGASTRODUODENOSCOPY (EGD);  Surgeon: Lajuana Matte, MD;  Location: [facility] OR;  Service: Thoracic;  Laterality: N/A;  . ESOPHAGOGASTRODUODENOSCOPY (EGD) WITH PROPOFOL N/A 04/17/2019   Procedure: ESOPHAGOGASTRODUODENOSCOPY (EGD) WITH PROPOFOL;  Surgeon: Lucilla Lame, MD;  Location: [facility] ENDOSCOPY;  Service: Endoscopy;  Laterality: N/A;  . GIVENS CAPSULE STUDY  05/2019   negative  . VIDEO ASSISTED THORACOSCOPY (VATS)/DECORTICATION Right 03/12/2020   Procedure: VIDEO ASSISTED THORACOSCOPY (VATS)/DRAINING OF AN EMPYEMA;  Surgeon: Lajuana Matte, MD;  Location: [place];  Service: Thoracic;  Laterality: Right;  . XI ROBOTIC ASSISTED HIATAL HERNIA REPAIR N/A 03/12/2020   Procedure: XI ROBOTIC ASSISTED HIATAL HERNIA REPAIR USING ACELL MATRIX HIATAL;  Surgeon: Lajuana Matte, MD;  Location: [facility] OR;  Service: Thoracic;  Laterality: N/A;    Current Medications: Current Meds  Medication Sig  . albuterol (VENTOLIN HFA) 108 (90 Base) MCG/ACT inhaler Inhale 2 puffs into the lungs every 6 (six) hours as needed for wheezing or shortness of breath.  . fluticasone (FLONASE) 50 MCG/ACT nasal spray Place 2 sprays into both nostrils as needed for allergies or rhinitis. (Patient taking differently: Place 2 sprays into both nostrils daily as needed for allergies or rhinitis. )  . folic acid (FOLVITE) 1 MG tablet  Take 1 tablet (1 mg total) by mouth daily.  . hydrALAZINE (APRESOLINE) 25 MG tablet Take 1-2 tablets (25-50 mg total) by mouth 3 (three) times daily as needed. Take for BP >150/90  . hydrochlorothiazide (HYDRODIURIL) 25 MG tablet Take 1 tablet (25 mg total) by mouth daily.  Marland Kitchen ibuprofen (ADVIL) 200 MG tablet Take 400 mg by mouth every 8 (eight) hours as needed (for pain.).  Marland Kitchen levothyroxine (SYNTHROID) 88 MCG  tablet Take 1 tablet (88 mcg total) by mouth daily before breakfast.  . lisinopril (ZESTRIL) 40 MG tablet Take 1 tablet (40 mg total) by mouth daily.  . metoprolol tartrate (LOPRESSOR) 25 MG tablet Take 100 mg by mouth 2 (two) times daily.  Marland Kitchen omeprazole (PRILOSEC) 20 MG capsule Take 1 capsule (20 mg total) by mouth at bedtime.  . [DISCONTINUED] spironolactone (ALDACTONE) 25 MG tablet Take 1 tablet (25 mg total) by mouth daily.     Allergies:   Patient has no known allergies.   Social History   Socioeconomic History  . Marital status: Married    Spouse name: [place]  . Number of children: Not on file  . Years of education: 44  . Highest education level: Some college, no degree  Occupational History  . Not on file  Tobacco Use  . Smoking status: Never Smoker  . Smokeless tobacco: Never Used  Substance and Sexual Activity  . Alcohol use: Not Currently    Comment: quit ETOH 03/12/2020  . Drug use: Yes    Types: Marijuana    Comment: gummy, last week  . Sexual activity: Yes  Other Topics Concern  . Not on file  Social History Narrative   alcohol use- cut back April 2021.  Lives with his wife at home.   No lung dx hx, grew up with second hand smoke, wife smokes out of home   Social Determinants of Health   Financial Resource Strain:   . Difficulty of Paying Living Expenses:   Food Insecurity:   . Worried About Programme researcher, broadcasting/film/video in the Last Year:   . Barista in the Last Year:   Transportation Needs:   . Freight forwarder (Medical):   Marland Kitchen Lack of Transportation (Non-Medical):   Physical Activity:   . Days of Exercise per Week:   . Minutes of Exercise per Session:   Stress:   . Feeling of Stress :   Social Connections:   . Frequency of Communication with Friends and Family:   . Frequency of Social Gatherings with Friends and Family:   . Attends Religious Services:   . Active Member of Clubs or Organizations:   . Attends Banker Meetings:   Marland Kitchen  Marital Status:      Family History: The patient's family history includes Heart disease in his brother; Hypertension in his brother and mother; Liver cancer in his maternal uncle; Stroke (age of onset: 13) in his father; Thyroid disease in his mother.  ROS:   Please see the history of present illness.     All other systems reviewed and are negative.  EKGs/Labs/Other Studies Reviewed:    The following studies were reviewed today:   EKG:  EKG is  ordered today.  The ekg ordered today demonstrates atrial fibrillation, heart rate 75  Recent Labs: 01/07/2020: B Natriuretic Peptide 156.0 02/19/2020: TSH 3.11 03/14/2020: Hemoglobin 13.7; Platelets 201 03/26/2020: ALT 25; BUN 12; Creat 1.00; Potassium 5.1; Sodium 134  Recent Lipid Panel    Component Value  Date/Time   CHOL 181 12/27/2019 1215   TRIG 79 12/27/2019 1215   HDL 52 12/27/2019 1215   CHOLHDL 3.5 12/27/2019 1215   LDLCALC 112 (H) 12/27/2019 1215    Physical Exam:    VS:  BP 100/80 (BP Location: Left Arm, Patient Position: Sitting, Cuff Size: Normal)   Pulse 75   Ht 5\' 8"  (1.727 m)   Wt 195 lb (88.5 kg)   SpO2 99%   BMI 29.65 kg/m     Wt Readings from Last 3 Encounters:  04/24/20 195 lb (88.5 kg)  03/28/20 205 lb (93 kg)  03/26/20 206 lb 3.2 oz (93.5 kg)     GEN:  Well nourished, well developed in no acute distress HEENT: Normal NECK: No JVD; No carotid bruits LYMPHATICS: No lymphadenopathy CARDIAC: Irregular irregular, no murmurs, rubs, gallops RESPIRATORY: Decreased breath sounds at the right lung base, clear anteriorly ABDOMEN: Soft, non-tender, non-distended MUSCULOSKELETAL:  No edema; No deformity  SKIN: Warm and dry NEUROLOGIC:  Alert and oriented x 3 PSYCHIATRIC:  Normal affect   ASSESSMENT:    1. Persistent atrial fibrillation (HCC)   2. Essential hypertension    PLAN:    In order of problems listed above:  1. Patient with persistent atrial fibrillation.  CHA2DS2-VASc: 1(htn).  Intolerable to  anticoagulants with severe anemia.  GI work-up is so far been unrevealing.  Continue Lopressor 100 mg twice daily.  Echocardiogram showed normal systolic function, EF 60 to 65%, moderately dilated LA, mild aortic valve sclerosis.  Due to history of severe anemia 2/2 GI bleed, low CHA2DS2-VASc score, will not anticoagulate patient.  Will consider anticoagulation when safe and if CHA2DS2-VASc score is equal to or greater than 2, and GI source of bleeding is identified.  Watchman will also be considered if he has elevated CHA2DS2-VASc score and cannot tolerate anticoagulants. 2. History of hypertension, blood now controlled.  Continue lisinopril, HCTZ 25 mg daily.  Stop spironolactone.  Low-salt diet advised.  Follow-up in 3 months  Total encounter time 03/28/20 minutes  Greater than 50% was spent in counseling and coordination of care with the patient Time spent explaining to patient findings on echo, therapeutic options for atrial fibrillation to prevent stroke risk including watchman, anticoagulation side effects.  All questions were answered.  This note was generated in part or whole with voice recognition software. Voice recognition is usually quite accurate but there are transcription errors that can and very often do occur. I apologize for any typographical errors that were not detected and corrected.  Medication Adjustments/Labs and Tests Ordered: Current medicines are reviewed at length with the patient today.  Concerns regarding medicines are outlined above.  Orders Placed This Encounter  Procedures  . EKG 12-Lead   No orders of the defined types were placed in this encounter.   Patient Instructions  Medication Instructions:  STOP taking your Spironolactone. *If you need a refill on your cardiac medications before your next appointment, please call your pharmacy*   Lab Work: None Ordered. If you have labs (blood work) drawn today and your tests are completely normal, you will receive  your results only by: MyChart Message (if you have MyChart) OR . A paper copy in the mail If you have any lab test that is abnormal or we need to change your treatment, we will call you to review the results.   Testing/Procedures: None Ordered.   Follow-Up: At [facility], you and your health needs are our priority.  As part of our  continuing mission to provide you with exceptional heart care, we have created designated Provider Care Teams.  These Care Teams include your primary Cardiologist (physician) and Advanced Practice Providers (APPs -  Physician Assistants and Nurse Practitioners) who all work together to provide you with the care you need, when you need it.  We recommend signing up for the patient portal called "MyChart".  Sign up information is provided on this After Visit Summary.  MyChart is used to connect with patients for Virtual Visits (Telemedicine).  Patients are able to view lab/test results, encounter notes, upcoming appointments, etc.  Non-urgent messages can be sent to your provider as well.   To learn more about what you can do with MyChart, go to [email or web address].    Your next appointment:   3 month(s)  The format for your next appointment:   In Person  Provider:   Debbe Odea, MD   Other Instructions N/A     Signed, Debbe Odea, MD  04/24/2020 1:15 PM    [facility]

## 2020-04-24 NOTE — Patient Instructions (Signed)
Medication Instructions:  STOP taking your Spironolactone. *If you need a refill on your cardiac medications before your next appointment, please call your pharmacy*   Lab Work: None Ordered. If you have labs (blood work) drawn today and your tests are completely normal, you will receive your results only by: Marland Kitchen MyChart Message (if you have MyChart) OR . A paper copy in the mail If you have any lab test that is abnormal or we need to change your treatment, we will call you to review the results.   Testing/Procedures: None Ordered.   Follow-Up: At [facility], you and your health needs are our priority.  As part of our continuing mission to provide you with exceptional heart care, we have created designated Provider Care Teams.  These Care Teams include your primary Cardiologist (physician) and Advanced Practice Providers (APPs -  Physician Assistants and Nurse Practitioners) who all work together to provide you with the care you need, when you need it.  We recommend signing up for the patient portal called "MyChart".  Sign up information is provided on this After Visit Summary.  MyChart is used to connect with patients for Virtual Visits (Telemedicine).  Patients are able to view lab/test results, encounter notes, upcoming appointments, etc.  Non-urgent messages can be sent to your provider as well.   To learn more about what you can do with MyChart, go to [email or web address].    Your next appointment:   3 month(s)  The format for your next appointment:   In Person  Provider:   Debbe Odea, MD   Other Instructions N/A

## 2020-04-25 ENCOUNTER — Ambulatory Visit: Payer: Medicaid Other | Admitting: Thoracic Surgery (Cardiothoracic Vascular Surgery)

## 2020-04-25 LAB — ACID FAST CULTURE WITH REFLEXED SENSITIVITIES (MYCOBACTERIA): Acid Fast Culture: NEGATIVE

## 2020-04-28 ENCOUNTER — Ambulatory Visit (INDEPENDENT_AMBULATORY_CARE_PROVIDER_SITE_OTHER): Payer: Medicaid Other | Admitting: Gastroenterology

## 2020-04-28 ENCOUNTER — Other Ambulatory Visit: Payer: Self-pay

## 2020-04-28 ENCOUNTER — Encounter: Payer: Self-pay | Admitting: Gastroenterology

## 2020-04-28 VITALS — BP 100/67 | HR 74 | Temp 97.7°F | Wt 197.0 lb

## 2020-04-28 DIAGNOSIS — R748 Abnormal levels of other serum enzymes: Secondary | ICD-10-CM | POA: Diagnosis not present

## 2020-04-28 DIAGNOSIS — Z8619 Personal history of other infectious and parasitic diseases: Secondary | ICD-10-CM | POA: Diagnosis not present

## 2020-04-28 NOTE — Patient Instructions (Signed)
Please remember to not eat or drink after midnight the night before. Please arrive at 9:30 AM at the [facility].

## 2020-04-29 NOTE — Progress Notes (Signed)
[name], MD [address]  [address]  [place], [place] [address]  Main: [phone]  Fax: [phone]   Primary Care Physician: Danelle Berry, PA-C   Chief Complaint  Patient presents with  . Cirrhosis of the liver    Patient stated that he ahd been doing well with no complaints.    HPI: Dean Anderson is a 54 y.o. male with recent pleural effusions requiring thoracentesis, and VATS procedure, along with hiatal hernia repair, previous history of alcohol abuse-abstinent since March or April 2021, elevated liver enzymes, here for follow-up.    Also has history of H. pylori that was treated, Barrett's esophagus, hiatal hernia,  Previous history: previously seen in April 2020 due to iron deficiency anemia anemia and intermittent bright red blood per rectum on Eliquis.  The patient denies abdominal or flank pain, anorexia, nausea or vomiting, dysphagia, change in bowel habits or black or bloody stools or weight loss.   Previous history:  May 2020 Upper endoscopy , Dr. Servando Snare 6 cm long segment Barrett's, biopsied every 2 cm Gastric erythema Hiatal hernia  Colonoscopy, Dr. Servando Snare, with nonbleeding internal hemorrhoids, otherwise normal  Pathology showed H. 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 Leone Payor with no etiology of iron deficiency anemia on small bowel exam  CT scan done by hematology oncology do not reveal any sources of iron deficiency anemia except for a large hiatal hernia for which she was referred to surgery  He also had mildly elevated liver enzymes and reported daily alcohol use.  He was advised to abstain from alcohol use.  Further viral and autoimmune hepatitis work-up was negative.  Right upper quadrant ultrasound indicated hepatic steatosis with no focal liver lesions    Current Outpatient Medications  Medication Sig Dispense Refill  . fluticasone (FLONASE) 50 MCG/ACT nasal spray Place  2 sprays into both nostrils as needed for allergies or rhinitis. (Patient taking differently: Place 2 sprays into both nostrils daily as needed for allergies or rhinitis. ) 16 g 5  . folic acid (FOLVITE) 1 MG tablet Take 1 tablet (1 mg total) by mouth daily. 30 tablet 1  . hydrALAZINE (APRESOLINE) 25 MG tablet Take 1-2 tablets (25-50 mg total) by mouth 3 (three) times daily as needed. Take for BP >150/90 90 tablet 2  . hydrochlorothiazide (HYDRODIURIL) 25 MG tablet Take 1 tablet (25 mg total) by mouth daily. 90 tablet 3  . ibuprofen (ADVIL) 200 MG tablet Take 400 mg by mouth every 8 (eight) hours as needed (for pain.).    Marland Kitchen levothyroxine (SYNTHROID) 88 MCG tablet Take 1 tablet (88 mcg total) by mouth daily before breakfast. 90 tablet 1  . lisinopril (ZESTRIL) 40 MG tablet Take 1 tablet (40 mg total) by mouth daily. 90 tablet 3  . metoprolol tartrate (LOPRESSOR) 25 MG tablet Take 100 mg by mouth 2 (two) times daily.    Marland Kitchen omeprazole (PRILOSEC) 20 MG capsule Take 1 capsule (20 mg total) by mouth at bedtime. 90 capsule 1  . albuterol (VENTOLIN HFA) 108 (90 Base) MCG/ACT inhaler Inhale 2 puffs into the lungs every 6 (six) hours as needed for wheezing or shortness of breath.     No current facility-administered medications for this visit.    Allergies as of 04/28/2020  . (No Known Allergies)    ROS:  General: Negative for anorexia, weight loss, fever, chills, fatigue, weakness. ENT: Negative for hoarseness, difficulty swallowing , nasal congestion. CV: Negative for chest pain,  angina, palpitations, dyspnea on exertion, peripheral edema.  Respiratory: Negative for dyspnea at rest, dyspnea on exertion, cough, sputum, wheezing.  GI: See history of present illness. GU:  Negative for dysuria, hematuria, urinary incontinence, urinary frequency, nocturnal urination.  Endo: Negative for unusual weight change.    Physical Examination:   BP 100/67   Pulse 74   Temp 97.7 F (36.5 C) (Oral)   Wt 197  lb (89.4 kg)   BMI 29.95 kg/m   General: Well-nourished, well-developed in no acute distress.  Eyes: No icterus. Conjunctivae pink. Mouth: Oropharyngeal mucosa moist and pink , no lesions erythema or exudate. Neck: Supple, Trachea midline Abdomen: Bowel sounds are normal, nontender, nondistended, no hepatosplenomegaly or masses, no abdominal bruits or hernia , no rebound or guarding.   Extremities: No lower extremity edema. No clubbing or deformities. Neuro: Alert and oriented x 3.  Grossly intact. Skin: Warm and dry, no jaundice.   Psych: Alert and cooperative, normal mood and affect.   Labs: CMP     Component Value Date/Time   NA 134 (L) 03/26/2020 0937   NA 138 04/13/2019 1156   K 5.1 03/26/2020 0937   CL 101 03/26/2020 0937   CO2 25 03/26/2020 0937   GLUCOSE 92 03/26/2020 0937   BUN 12 03/26/2020 0937   BUN 8 04/13/2019 1156   CREATININE 1.00 03/26/2020 0937   CALCIUM 9.1 03/26/2020 0937   PROT 6.7 03/26/2020 0937   PROT 6.7 06/13/2019 1337   ALBUMIN 3.2 (L) 03/14/2020 0550   ALBUMIN 4.0 06/13/2019 1337   AST 25 03/26/2020 0937   ALT 25 03/26/2020 0937   ALKPHOS 31 (L) 03/14/2020 0550   BILITOT 1.3 (H) 03/26/2020 0937   BILITOT 1.4 (H) 06/13/2019 1337   GFRNONAA 87 03/26/2020 0937   GFRAA 101 03/26/2020 0937   Lab Results  Component Value Date   WBC 10.8 (H) 03/14/2020   HGB 13.7 03/14/2020   HCT 43.3 03/14/2020   MCV 91.7 03/14/2020   PLT 201 03/14/2020    Imaging Studies: ECHOCARDIOGRAM COMPLETE  Result Date: 04/21/2020    ECHOCARDIOGRAM REPORT   Patient Name:   Dean Anderson Date of Exam: 04/21/2020 Medical Rec #:  [id number]     Height:       68.0 in Accession #:    [id number]    Weight:       205.0 lb Date of Birth:  [birth year]     BSA:          2.065 m Patient Age:    53 years      BP:           160/76 mmHg Patient Gender: M             HR:           106 bpm. Exam Location:  [facility] Procedure: 2D Echo, Cardiac Doppler and Color Doppler Indications:     I48.1 Persistent atrial fibrillation; R06.9 DOE  History:        Patient has no prior history of Echocardiogram examinations.                 Arrythmias:Atrial Fibrillation, Signs/Symptoms:Dyspnea; Risk                 Factors:Hypertension. 03/12/20 s/p VATS draining of empyema, right                 pleural effusion.  Sonographer:    Vella Kohler BS, RVT, RDCS Referring Phys: [id number] Debbe Odea IMPRESSIONS  1. Left ventricular ejection fraction, by estimation, is 60 to 65%. The left ventricle has normal function. The left ventricle has no regional wall motion abnormalities. There is mild left ventricular hypertrophy. Left ventricular diastolic parameters are indeterminate.  2. Right ventricular systolic function is normal. The right ventricular size is normal. There is mildly elevated pulmonary artery systolic pressure. The estimated right ventricular systolic pressure is 32.3 mmHg.  3. Left atrial size was moderately dilated.  4. The mitral valve is normal in structure. No evidence of mitral valve regurgitation. No evidence of mitral stenosis.  5. The aortic valve is normal in structure. Aortic valve regurgitation is not visualized. Mild to moderate aortic valve sclerosis/calcification is present, without any evidence of aortic stenosis.  6. The inferior vena cava is normal in size with greater than 50% respiratory variability, suggesting right atrial pressure of 3 mmHg. FINDINGS  Left Ventricle: Left ventricular ejection fraction, by estimation, is 60 to 65%. The left ventricle has normal function. The left ventricle has no regional wall motion abnormalities. The left ventricular internal cavity size was normal in size. There is  mild left ventricular hypertrophy. Left ventricular diastolic parameters are indeterminate. Right Ventricle: The right ventricular size is normal. No increase in right ventricular wall thickness. Right ventricular systolic function is normal. There is mildly elevated pulmonary  artery systolic pressure. The tricuspid regurgitant velocity is 3.04  m/s, and with an assumed right atrial pressure of 3 mmHg, the estimated right ventricular systolic pressure is 55.7 mmHg. Left Atrium: Left atrial size was moderately dilated. Right Atrium: Right atrial size was normal in size. Pericardium: There is no evidence of pericardial effusion. Mitral Valve: The mitral valve is normal in structure. Normal mobility of the mitral valve leaflets. No evidence of mitral valve regurgitation. No evidence of mitral valve stenosis. Tricuspid Valve: The tricuspid valve is normal in structure. Tricuspid valve regurgitation is trivial. No evidence of tricuspid stenosis. Aortic Valve: The aortic valve is normal in structure. Aortic valve regurgitation is not visualized. Mild to moderate aortic valve sclerosis/calcification is present, without any evidence of aortic stenosis. Pulmonic Valve: The pulmonic valve was normal in structure. Pulmonic valve regurgitation is not visualized. No evidence of pulmonic stenosis. Aorta: The aortic root is normal in size and structure. Venous: The inferior vena cava is normal in size with greater than 50% respiratory variability, suggesting right atrial pressure of 3 mmHg. IAS/Shunts: No atrial level shunt detected by color flow Doppler.  LEFT VENTRICLE PLAX 2D LVIDd:         3.50 cm  Diastology LVIDs:         1.90 cm  LV e' lateral:   12.20 cm/s LV PW:         1.30 cm  LV E/e' lateral: 11.3 LV IVS:        1.40 cm  LV e' medial:    12.80 cm/s LVOT diam:     2.20 cm  LV E/e' medial:  10.8 LV SV:         57 LV SV Index:   27 LVOT Area:     3.80 cm  RIGHT VENTRICLE RV Basal diam:  2.90 cm RV Mid diam:    2.30 cm RV S prime:     9.36 cm/s LEFT ATRIUM              Index       RIGHT ATRIUM           Index LA diam:  3.80 cm  1.84 cm/m  RA Area:     19.10 cm LA Vol (A2C):   107.0 ml 51.81 ml/m RA Volume:   53.60 ml  25.95 ml/m LA Vol (A4C):   93.7 ml  45.37 ml/m LA Biplane Vol:  107.0 ml 51.81 ml/m  AORTIC VALVE             PULMONIC VALVE LVOT Vmax:   76.40 cm/s  PV Vmax:       0.65 m/s LVOT Vmean:  58.900 cm/s PV Peak grad:  1.7 mmHg LVOT VTI:    0.149 m  AORTA Ao Root diam: 3.40 cm Ao Asc diam:  3.10 cm MITRAL VALVE                TRICUSPID VALVE MV Area (PHT): 7.66 cm     TR Peak grad:   37.0 mmHg MV Decel Time: 99 msec      TR Vmax:        304.00 cm/s MV E velocity: 138.00 cm/s MV A velocity: 39.60 cm/s   SHUNTS MV E/A ratio:  3.48         Systemic VTI:  0.15 m                             Systemic Diam: 2.20 cm Lorine Bears MD Electronically signed by Lorine Bears MD Signature Date/Time: 04/21/2020/3:37:24 PM    Final     Assessment and Plan:   Dean Anderson is a 54 y.o. y/o male previous history of elevated liver enzymes here for follow-up, with recent pleural effusion recurrent thoracentesis, VATS procedure along with paraesophageal hernia repair, with negative work-up for iron deficiency anemia previously  Obtain abdominal ultrasound to evaluate liver Bilirubin at baseline in April 2021  Repeat H. pylori test for eradication testing due to previously treated H. Pylori  Continue acid reflux lifestyle changes given history of Barrett's esophagus  No breakthrough symptoms on PPI once daily  Continue abstinence from alcohol  Consider fibrosure level if [place] inconclusive for cirrhosis  Dr [name]

## 2020-04-30 ENCOUNTER — Other Ambulatory Visit: Payer: Self-pay | Admitting: Thoracic Surgery (Cardiothoracic Vascular Surgery)

## 2020-04-30 DIAGNOSIS — J90 Pleural effusion, not elsewhere classified: Secondary | ICD-10-CM

## 2020-04-30 LAB — H. PYLORI BREATH TEST: H pylori Breath Test: NEGATIVE

## 2020-05-01 ENCOUNTER — Ambulatory Visit (INDEPENDENT_AMBULATORY_CARE_PROVIDER_SITE_OTHER): Payer: Self-pay | Admitting: Thoracic Surgery (Cardiothoracic Vascular Surgery)

## 2020-05-01 ENCOUNTER — Encounter: Payer: Self-pay | Admitting: Thoracic Surgery (Cardiothoracic Vascular Surgery)

## 2020-05-01 ENCOUNTER — Ambulatory Visit
Admission: RE | Admit: 2020-05-01 | Discharge: 2020-05-01 | Disposition: A | Discharge status: Home / Self Care | Payer: Medicaid Other | Source: Ambulatory Visit | Attending: Thoracic Surgery (Cardiothoracic Vascular Surgery) | Admitting: Thoracic Surgery (Cardiothoracic Vascular Surgery)

## 2020-05-01 ENCOUNTER — Other Ambulatory Visit: Payer: Self-pay

## 2020-05-01 DIAGNOSIS — Z8719 Personal history of other diseases of the digestive system: Secondary | ICD-10-CM

## 2020-05-01 DIAGNOSIS — J90 Pleural effusion, not elsewhere classified: Secondary | ICD-10-CM

## 2020-05-01 DIAGNOSIS — Z9889 Other specified postprocedural states: Secondary | ICD-10-CM

## 2020-05-01 NOTE — Progress Notes (Signed)
      [address]       [place] [address]             [phone]        [facility] Medical Record #[id number] Date of Birth: July 12, 1966  Referring: Nyoka Cowden, MD Primary Care: Danelle Berry, PA-C Primary Cardiologist:Brian Agbor-Etang, MD  Reason for visit:   follow-up  History of Present Illness:     Mr. Hammonds presents as follow-up appointment.  Overall he is doing much better.  He denies any issues swallowing.  He also states that his breathing is much better.  Physical Exam: There were no vitals taken for this visit.  Alert NAD Incision clean.   Abdomen soft, ND No peripheral edema   Diagnostic Studies & Laboratory data: CXR: No pleural effusion right side     Assessment / Plan:   54 year old male status post paraesophageal hernia repair, and drainage of a right-sided effusion.  His imaging shows complete resolution of the pleural effusion.  Additionally his liver function testing is improved. Return to clinic as needed.   Corliss Skains 05/01/2020 4:36 PM

## 2020-05-02 ENCOUNTER — Ambulatory Visit: Payer: Self-pay | Admitting: Thoracic Surgery (Cardiothoracic Vascular Surgery)

## 2020-05-04 ENCOUNTER — Other Ambulatory Visit: Payer: Self-pay | Admitting: Physician Assistant

## 2020-05-06 ENCOUNTER — Ambulatory Visit: Payer: Medicaid Other | Admitting: Family Medicine

## 2020-05-06 ENCOUNTER — Ambulatory Visit
Admission: RE | Admit: 2020-05-06 | Discharge: 2020-05-06 | Disposition: A | Discharge status: Home / Self Care | Payer: Medicaid Other | Source: Ambulatory Visit | Attending: Gastroenterology | Admitting: Gastroenterology

## 2020-05-06 ENCOUNTER — Other Ambulatory Visit: Payer: Self-pay

## 2020-05-06 DIAGNOSIS — Z8619 Personal history of other infectious and parasitic diseases: Secondary | ICD-10-CM | POA: Insufficient documentation

## 2020-05-06 DIAGNOSIS — K7689 Other specified diseases of liver: Secondary | ICD-10-CM | POA: Diagnosis not present

## 2020-05-16 ENCOUNTER — Telehealth: Payer: Self-pay

## 2020-05-16 NOTE — Addendum Note (Signed)
Addended by: [name] on: 05/16/2020 11:17 AM   Modules accepted: Orders

## 2020-05-16 NOTE — Telephone Encounter (Signed)
Called patient but spoke to his wife-Dean Anderson and was able to let her know what his ultrasound results were since she wanted me to tell her and then she would tell her husband when he goes home for lunch today. Therefore, I gave her the results since she is in his chart a person to give results. I also mentioned to Dean Anderson that Dean Anderson would like for him to have an additional lab and I gave her the option to come in to our office or to any [facility] location to have it drawn. Dean Anderson stated that it would be better for him to go to a [facility] location. I told her that all he would need is his photo ID and insurance information since he was already in the system. Dean Anderson agreed and had no further questions. Dean Anderson was also informed that our receptionist-Dean Anderson would call them back to schedule a follow up appointment in 6 weeks to come in and see Dean Anderson. Dean Anderson was notified by secure chat to call patient.

## 2020-05-16 NOTE — Telephone Encounter (Signed)
-----   Message from Pasty Spillers, MD sent at 05/16/2020 11:17 AM EDT ----- Dean Anderson please let the patient know, his ultrasound shows coarse liver.  I have ordered a blood test to help evaluate for cirrhosis.  Please have him make a clinic appointment in 6 to 8 weeks

## 2020-05-21 DIAGNOSIS — R748 Abnormal levels of other serum enzymes: Secondary | ICD-10-CM | POA: Diagnosis not present

## 2020-05-23 LAB — NASH FIBROSURE
ALPHA 2-MACROGLOBULINS, QN: 240 mg/dL (ref 110–276)
ALT (SGPT) P5P: 17 IU/L (ref 0–55)
AST (SGOT) P5P: 20 IU/L (ref 0–40)
Apolipoprotein A-1: 140 mg/dL (ref 101–178)
Bilirubin, Total: 0.5 mg/dL (ref 0.0–1.2)
Cholesterol, Total: 209 mg/dL — ABNORMAL HIGH (ref 100–199)
Fibrosis Score: 0.31 — ABNORMAL HIGH (ref 0.00–0.21)
GGT: 31 IU/L (ref 0–65)
Glucose: 117 mg/dL — ABNORMAL HIGH (ref 65–99)
Haptoglobin: 142 mg/dL (ref 29–370)
Height: 68 in
NASH Score: 0.5 — ABNORMAL HIGH
Steatosis Score: 0.52 — ABNORMAL HIGH (ref 0.00–0.30)
Triglycerides: 240 mg/dL — ABNORMAL HIGH (ref 0–149)
Weight: 195 [lb_av]

## 2020-06-26 ENCOUNTER — Ambulatory Visit (INDEPENDENT_AMBULATORY_CARE_PROVIDER_SITE_OTHER): Payer: Medicaid Other | Admitting: Family Medicine

## 2020-06-26 ENCOUNTER — Encounter: Payer: Self-pay | Admitting: Family Medicine

## 2020-06-26 ENCOUNTER — Other Ambulatory Visit: Payer: Self-pay

## 2020-06-26 VITALS — BP 136/86 | HR 77 | Temp 97.3°F | Resp 16 | Ht 68.0 in | Wt 209.0 lb

## 2020-06-26 DIAGNOSIS — Z5181 Encounter for therapeutic drug level monitoring: Secondary | ICD-10-CM

## 2020-06-26 DIAGNOSIS — E785 Hyperlipidemia, unspecified: Secondary | ICD-10-CM | POA: Diagnosis not present

## 2020-06-26 DIAGNOSIS — K760 Fatty (change of) liver, not elsewhere classified: Secondary | ICD-10-CM

## 2020-06-26 DIAGNOSIS — I4819 Other persistent atrial fibrillation: Secondary | ICD-10-CM

## 2020-06-26 DIAGNOSIS — E039 Hypothyroidism, unspecified: Secondary | ICD-10-CM | POA: Diagnosis not present

## 2020-06-26 DIAGNOSIS — R7989 Other specified abnormal findings of blood chemistry: Secondary | ICD-10-CM

## 2020-06-26 DIAGNOSIS — I10 Essential (primary) hypertension: Secondary | ICD-10-CM | POA: Diagnosis not present

## 2020-06-26 LAB — COMPLETE METABOLIC PANEL WITH GFR
AG Ratio: 1.5 (calc) (ref 1.0–2.5)
ALT: 17 U/L (ref 9–46)
AST: 15 U/L (ref 10–35)
Albumin: 4.1 g/dL (ref 3.6–5.1)
Alkaline phosphatase (APISO): 35 U/L (ref 35–144)
BUN: 14 mg/dL (ref 7–25)
CO2: 27 mmol/L (ref 20–32)
Calcium: 9.4 mg/dL (ref 8.6–10.3)
Chloride: 94 mmol/L — ABNORMAL LOW (ref 98–110)
Creat: 1.2 mg/dL (ref 0.70–1.33)
GFR, Est African American: 80 mL/min/{1.73_m2} (ref >=60)
GFR, Est Non African American: 69 mL/min/{1.73_m2} (ref >=60)
Globulin: 2.7 g/dL (calc) (ref 1.9–3.7)
Glucose, Bld: 85 mg/dL (ref 65–99)
Potassium: 4.8 mmol/L (ref 3.5–5.3)
Sodium: 129 mmol/L — ABNORMAL LOW (ref 135–146)
Total Bilirubin: 1.4 mg/dL — ABNORMAL HIGH (ref 0.2–1.2)
Total Protein: 6.8 g/dL (ref 6.1–8.1)

## 2020-06-26 LAB — LIPID PANEL
Cholesterol: 191 mg/dL (ref <200)
HDL: 55 mg/dL (ref >=40)
LDL Cholesterol (Calc): 109 mg/dL (calc) — ABNORMAL HIGH
Non-HDL Cholesterol (Calc): 136 mg/dL (calc) — ABNORMAL HIGH (ref <130)
Total CHOL/HDL Ratio: 3.5 (calc) (ref <5.0)
Triglycerides: 154 mg/dL — ABNORMAL HIGH (ref <150)

## 2020-06-26 LAB — TSH: TSH: 2.86 mIU/L (ref 0.40–4.50)

## 2020-06-26 MED ORDER — METOPROLOL TARTRATE 100 MG PO TABS: 100.0000 mg | ORAL_TABLET | Freq: Two times a day (BID) | ORAL | 3 refills | Status: DC

## 2020-06-26 NOTE — Patient Instructions (Signed)
COVID-19 Vaccine Information can be found at: [email or web address] For questions related to vaccine distribution or appointments, please email [email or web address] or call [phone].    

## 2020-06-26 NOTE — Progress Notes (Signed)
Name: Dean Anderson   MRN: [id number]    DOB: Apr 17, 1966   Date:06/26/2020       Progress Note  Chief Complaint  Patient presents with  . Hypertension  . Hypothyroidism     Subjective:   Dean Anderson is a 54 y.o. male, presents to clinic for   Hypothyroidism: Current Medication Regimen: 88 mcg synthroid Takes medicine daily in the am Current Symptoms: denies fatigue, weight changes, heat/cold intolerance, bowel/skin changes or CVS symptoms Most recent results are below; we will be repeating labs today ( due to recent abnormal labs and a lot of medical changes) Lab Results  Component Value Date   TSH 3.11 02/19/2020   HTN afib - Managed by cardiology - Dr. Mervyn Skeeters  Hypertension:  Currently managed on lisinopril 40 mg, HCTZ 25 mg  For Afib documented in chart to be taking 100 mg metoprolol BID - no new Rx was sent in, so patient has continued to take 75 mg BID of what he has  He has not used hydralazine in a long time - removed from med list amiodorone was discontinued No longer taking lasix or spironolactone  Pt reports good med compliance and denies any SE. No lightheadedness, hypotension, syncope. Blood pressure today is a little high today, repeated VS and improved a little, he is doing well with home monitoring and states it is usually about 120/70 on average at home.  BP Readings from Last 3 Encounters:  06/26/20 (!) 144/100  04/28/20 100/67  04/24/20 100/80  Pt denies CP, SOB, exertional sx, LE edema, palpitation, Ha's, visual disturbances  Hyperlipidemia: Pt is not on statin Last Lipids: Lab Results  Component Value Date   CHOL 209 (H) 05/21/2020   HDL 52 12/27/2019   LDLCALC 112 (H) 12/27/2019   TRIG 240 (H) 05/21/2020   CHOLHDL 3.5 12/27/2019   - Denies: Chest pain, shortness of breath, myalgias, claudication  LFTs were elevated but improving following hospitalization and surgery Seeing GI - suspected NASH?   Lab Results  Component Value Date   ALT 25  03/26/2020   AST 25 03/26/2020   ALKPHOS 31 (L) 03/14/2020   BILITOT 1.3 (H) 03/26/2020   GERD - pt taking prilosec daily, no GI upset.  Is doing much better after surgery. He did start drinking ETOH again - has about 1-2 beers a few times a week.  No longer feeling SOB, and no LE edema He hasn't used the inhaler in a long time.     Current Outpatient Medications:  .  albuterol (VENTOLIN HFA) 108 (90 Base) MCG/ACT inhaler, Inhale 2 puffs into the lungs every 6 (six) hours as needed for wheezing or shortness of breath., Disp: , Rfl:  .  fluticasone (FLONASE) 50 MCG/ACT nasal spray, Place 2 sprays into both nostrils as needed for allergies or rhinitis. (Patient taking differently: Place 2 sprays into both nostrils daily as needed for allergies or rhinitis. ), Disp: 16 g, Rfl: 5 .  folic acid (FOLVITE) 1 MG tablet, Take 1 tablet (1 mg total) by mouth daily., Disp: 30 tablet, Rfl: 1 .  hydrALAZINE (APRESOLINE) 25 MG tablet, Take 1-2 tablets (25-50 mg total) by mouth 3 (three) times daily as needed. Take for BP >150/90, Disp: 90 tablet, Rfl: 2 .  hydrochlorothiazide (HYDRODIURIL) 25 MG tablet, Take 1 tablet (25 mg total) by mouth daily., Disp: 90 tablet, Rfl: 3 .  ibuprofen (ADVIL) 200 MG tablet, Take 400 mg by mouth every 8 (eight) hours as needed (for pain.).,  Disp: , Rfl:  .  levothyroxine (SYNTHROID) 88 MCG tablet, Take 1 tablet (88 mcg total) by mouth daily before breakfast., Disp: 90 tablet, Rfl: 1 .  lisinopril (ZESTRIL) 40 MG tablet, Take 1 tablet (40 mg total) by mouth daily., Disp: 90 tablet, Rfl: 3 .  metoprolol tartrate (LOPRESSOR) 25 MG tablet, Take 100 mg by mouth 2 (two) times daily., Disp: , Rfl:  .  omeprazole (PRILOSEC) 20 MG capsule, Take 1 capsule (20 mg total) by mouth at bedtime., Disp: 90 capsule, Rfl: 1  Patient Active Problem List   Diagnosis Date Noted  . Hypothyroidism 06/26/2020  . Hepatic steatosis 06/26/2020  . Hyperlipidemia 06/26/2020  . History of repair of  hiatal hernia 03/12/2020  . Hyponatremia 02/20/2020  . Pleural effusion on right 01/11/2020  . Iron deficiency anemia due to chronic blood loss   . Acute gastritis without hemorrhage   . Hematochezia   . Hypertension 10/02/2018  . DOE (dyspnea on exertion) 10/02/2018  . Persistent atrial fibrillation (HCC) 09/21/2018    Past Surgical History:  Procedure Laterality Date  . COLONOSCOPY WITH PROPOFOL N/A 04/17/2019   Procedure: COLONOSCOPY WITH PROPOFOL;  Surgeon: Midge Minium, MD;  Location[facility] END[facility]COPY;  Service: Endoscopy;  Laterality: N/A;  . ESOPHAGOGASTRODUODENOSCOPY N/A 03/12/2020   Procedure: ESOPHAGOGASTRODUODENOSCOPY (EGD);  Surgeon: Corliss Skains, MD;  Location[facility] OR;  Service: Thoracic;  Laterality: N/A;  . ESOPHAGOGASTRODUODENOSCOPY (EGD) WITH PROPOFOL N/A 04/17/2019   Procedure: ESOPHAGOGASTRODUODENOSCOPY (EGD) WITH PROPOFOL;  Surgeon: Midge Minium, MD;  Location: [facility] ENDOSCOPY;  Service: Endoscopy;  Laterality: N/A;  . GIVENS CAPSULE STUDY  05/2019   negative  . VIDEO ASSISTED THORACOSCOPY (VATS)/DECORTICATION Right 03/12/2020   Procedure: VIDEO ASSISTED THORACOSCOPY (VATS)/DRAINING OF AN EMPYEMA;  Surgeon: Corliss Skains, MD;  Location: [facility] OR;  Service: Thoracic;  Laterality: Right;  . XI ROBOTIC ASSISTED HIATAL HERNIA REPAIR N/A 03/12/2020   Procedure: XI ROBOTIC ASSISTED HIATAL HERNIA REPAIR USING ACELL MATRIX HIATAL;  Surgeon: Corliss Skains, MD;  Location: [facility] OR;  Service: Thoracic;  Laterality: N/A;    Family History  Problem Relation Age of Onset  . Hypertension Mother   . Thyroid disease Mother   . Stroke Father 90  . Liver cancer Maternal Uncle   . Heart disease Brother   . Hypertension Brother     Social History   Tobacco Use  . Smoking status: Never Smoker  . Smokeless tobacco: Never Used  Vaping Use  . Vaping Use: Never used  Substance Use Topics  . Alcohol use: Not Currently    Comment: quit ETOH 03/12/2020  . Drug use: Yes     Types: Marijuana    Comment: gummy, last week     No Known Allergies  Health Maintenance  Topic Date Due  . COVID-19 Vaccine (1) Never done  . TETANUS/TDAP  12/26/2020 (Originally 06/05/1987)  . HIV Screening  01/06/2021 (Originally 06/05/1983)  . INFLUENZA VACCINE  07/06/2020  . COLONOSCOPY  04/16/2029  . Hepatitis C Screening  Completed    Chart Review Today: I personally reviewed active problem list, medication list, allergies, family history, social history, health maintenance, notes from last encounter, lab results, imaging with the patient/caregiver today.   Review of Systems  10 Systems reviewed and are negative for acute change except as noted in the HPI.  Objective:   Vitals:   06/26/20 1218 06/26/20 1241  BP: (!) 144/100 (!) 136/86  Pulse: 77   Resp: 16   Temp: (!) 97.3 F (36.3  C)   TempSrc: Temporal   SpO2: 100%   Weight: (!) 209 lb (94.8 kg)   Height: 5\' 8"  (1.727 m)     Body mass index is 31.78 kg/m.  Physical Exam Vitals and nursing note reviewed.  Constitutional:      General: He is not in acute distress.    Appearance: Normal appearance. He is well-developed. He is not ill-appearing, toxic-appearing or diaphoretic.     Interventions: Face mask in place.  HENT:     Head: Normocephalic and atraumatic.     Jaw: No trismus.     Right Ear: External ear normal.     Left Ear: External ear normal.  Eyes:     General: Lids are normal. No scleral icterus.       Right eye: No discharge.        Left eye: No discharge.     Conjunctiva/sclera: Conjunctivae normal.  Neck:     Trachea: Trachea and phonation normal. No tracheal deviation.  Cardiovascular:     Rate and Rhythm: Normal rate. Rhythm irregularly irregular.     Pulses: Normal pulses.          Radial pulses are 2+ on the right side and 2+ on the left side.     Heart sounds: Normal heart sounds. No murmur heard.  No friction rub. No gallop.   Pulmonary:     Effort: Pulmonary effort is normal.  No respiratory distress.     Breath sounds: Normal breath sounds. No stridor. No wheezing, rhonchi or rales.  Abdominal:     General: Bowel sounds are normal. There is no distension.     Palpations: Abdomen is soft.     Tenderness: There is no abdominal tenderness.  Musculoskeletal:     Right lower leg: No edema.     Left lower leg: No edema.  Skin:    General: Skin is warm and dry.     Coloration: Skin is not jaundiced.     Findings: No rash.     Nails: There is no clubbing.  Neurological:     Mental Status: He is alert.     Cranial Nerves: No dysarthria or facial asymmetry.     Motor: No tremor or abnormal muscle tone.     Gait: Gait normal.  Psychiatric:        Mood and Affect: Mood normal.        Speech: Speech normal.        Behavior: Behavior normal. Behavior is cooperative.         Assessment & Plan:   1. Persistent atrial fibrillation (HCC) Reviewed metoprolol - rate controlling med - adjusted Rx pt on 100 mg BID - encouraged to f/up with cardiology with tolerance and for refills when they adjust the meds and dosing.  Pt has not had correct meds  2. Essential hypertension BP at home are well controlled, he endorses feeling a little anxious here.  We reviewed his multiple med changes for HTN and afib  Med list updated and reviewed Lisinopril, HCTZ and metoprolol - COMPLETE METABOLIC PANEL WITH GFR  3. Elevated LFTs Per GI They have been improving I wonder if they would continue to improve (and fatty liver disease) if he used a statin to tx HLD (liver function would have to tolerate the med - per GI, will inquire) - COMPLETE METABOLIC PANEL WITH GFR - Lipid panel  4. Hypothyroidism, unspecified type No sx of chemical hyper or hypothyroid.  Recheck TSH today  due to a lot of recent medical excitement/surgery/med changes and recent abnormal TSH  If TSH remains steady and normal - can monitor annually - TSH  5. Hepatic steatosis Per GI (discussed diet, exercise  and cholesterol control and improvement with pt today- hand outs given)  - COMPLETE METABOLIC PANEL WITH GFR - Lipid panel  6. Hyperlipidemia, unspecified hyperlipidemia type Not on statin - see above #5 and #3 - COMPLETE METABOLIC PANEL WITH GFR - Lipid panel  7. Encounter for medication monitoring - COMPLETE METABOLIC PANEL WITH GFR - Lipid panel - TSH   Return in about 6 months (around 12/27/2020).   Danelle BerryLeisa Willmer Fellers, PA-C 06/26/20 12:22 PM

## 2020-07-16 ENCOUNTER — Other Ambulatory Visit: Payer: Self-pay | Admitting: Family Medicine

## 2020-07-16 DIAGNOSIS — E039 Hypothyroidism, unspecified: Secondary | ICD-10-CM

## 2020-07-23 ENCOUNTER — Other Ambulatory Visit: Payer: Self-pay

## 2020-07-23 ENCOUNTER — Encounter: Payer: Self-pay | Admitting: Gastroenterology

## 2020-07-23 ENCOUNTER — Ambulatory Visit (INDEPENDENT_AMBULATORY_CARE_PROVIDER_SITE_OTHER): Payer: Medicaid Other | Admitting: Gastroenterology

## 2020-07-23 VITALS — BP 130/87 | HR 73 | Temp 97.6°F | Ht 68.0 in | Wt 211.0 lb

## 2020-07-23 DIAGNOSIS — R748 Abnormal levels of other serum enzymes: Secondary | ICD-10-CM | POA: Diagnosis not present

## 2020-07-23 NOTE — Patient Instructions (Signed)
Dr. Maximino Greenland will get in contact with your primary care doctor and ask if you are going to need the Hepatitis A&B vaccine or not and then we will let you know.

## 2020-07-24 LAB — BILIRUBIN, FRACTIONATED(TOT/DIR/INDIR)
Bilirubin Total: 1.2 mg/dL (ref 0.0–1.2)
Bilirubin, Direct: 0.27 mg/dL (ref 0.00–0.40)
Bilirubin, Indirect: 0.93 mg/dL — ABNORMAL HIGH (ref 0.10–0.80)

## 2020-07-24 NOTE — Progress Notes (Signed)
Dean Bouillon, MD [address]  [address]  [place], [place] [address]  Main: [phone]  Fax: [phone]   Primary Care Physician: Danelle Berry, PA-C   Chief Complaint  Patient presents with  . Elevated Hepatic Enzymes    HPI: Dean Anderson is a 54 y.o. male here for follow-up of elevated liver enzymes. The patient denies abdominal or flank pain, anorexia, nausea or vomiting, dysphagia, change in bowel habits or black or bloody stools or weight loss.  Most recent blood work shows mildly elevated bilirubin, which is chronic and other liver enzymes are completely normal.  So far his work-up has shown ultrasound reporting hepatic steatosis, FibroSure level not consistent with cirrhosis.  Normal platelets, albumin not consistent with cirrhosis either.  Patient has significantly reduced his alcohol intake to only 2-3 drinks in a week.  Previous history: recent pleural effusions requiring thoracentesis, and VATS procedure, along with hiatal hernia repair, previous history of alcohol abuse-abstinent since March or April 2021, elevated liver enzymes  Also has history of H. pylori that was treated, Barrett's esophagus, hiatal hernia,   previously seen in April 2020 due to iron deficiency anemia anemia and intermittent bright red blood per rectum on Eliquis.  May 2020 Upper endoscopy , Dr. Servando Snare 6 cm long segment Barrett's, biopsied every 2 cm Gastric erythema Hiatal hernia  Colonoscopy, Dr. Servando Snare, with nonbleeding internal hemorrhoids, otherwise normal  Pathology showed H. 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 Leone Payor with no etiology of iron deficiency anemia on small bowel exam  CT scan done by hematology oncology do not reveal any sources of iron deficiency anemia except for a large hiatal hernia for which I did look use has been placed with some water when my I was just postpartum like a lot of like foods  and my mom he was referred to surgery  He also had mildly elevated liver enzymes and reported daily alcohol use.  He was advised to abstain from alcohol use.  Further viral and autoimmune hepatitis work-up was negative.  Right upper quadrant ultrasound indicated hepatic steatosis with no focal liver lesions    Current Outpatient Medications  Medication Sig Dispense Refill  . fluticasone (FLONASE) 50 MCG/ACT nasal spray Place 2 sprays into both nostrils as needed for allergies or rhinitis. (Patient taking differently: Place 2 sprays into both nostrils daily as needed for allergies or rhinitis. ) 16 g 5  . hydrochlorothiazide (HYDRODIURIL) 25 MG tablet Take 1 tablet (25 mg total) by mouth daily. 90 tablet 3  . levothyroxine (SYNTHROID) 88 MCG tablet TAKE 1 TABLET BY MOUTH ONCE DAILY BEFORE BREAKFAST 90 tablet 0  . lisinopril (ZESTRIL) 40 MG tablet Take 1 tablet (40 mg total) by mouth daily. 90 tablet 3  . metoprolol tartrate (LOPRESSOR) 100 MG tablet Take 1 tablet (100 mg total) by mouth 2 (two) times daily. 180 tablet 3  . omeprazole (PRILOSEC) 20 MG capsule Take 1 capsule (20 mg total) by mouth at bedtime. 90 capsule 1   No current facility-administered medications for this visit.    Allergies as of 07/23/2020  . (No Known Allergies)    ROS:  General: Negative for anorexia, weight loss, fever, chills, fatigue, weakness. ENT: Negative for hoarseness, difficulty swallowing , nasal congestion. CV: Negative for chest pain, angina, palpitations, dyspnea on exertion, peripheral edema.  Respiratory: Negative for dyspnea at rest, dyspnea on exertion, cough, sputum, wheezing.  GI: See history of present illness. GU:  Negative  for dysuria, hematuria, urinary incontinence, urinary frequency, nocturnal urination.  Endo: Negative for unusual weight change.    Physical Examination:   BP 130/87   Pulse 73   Temp 97.6 F (36.4 C) (Oral)   Ht 5\' 8"  (1.727 m)   Wt 211 lb (95.7 kg)   BMI 32.08  kg/m   General: Well-nourished, well-developed in no acute distress.  Eyes: No icterus. Conjunctivae pink. Mouth: Oropharyngeal mucosa moist and pink , no lesions erythema or exudate. Neck: Supple, Trachea midline Abdomen: Bowel sounds are normal, nontender, nondistended, no hepatosplenomegaly or masses, no abdominal bruits or hernia , no rebound or guarding.   Extremities: No lower extremity edema. No clubbing or deformities. Neuro: Alert and oriented x 3.  Grossly intact. Skin: Warm and dry, no jaundice.   Psych: Alert and cooperative, normal mood and affect.   Labs: CMP     Component Value Date/Time   NA 129 (L) 06/26/2020 1227   NA 138 04/13/2019 1156   K 4.8 06/26/2020 1227   CL 94 (L) 06/26/2020 1227   CO2 27 06/26/2020 1227   GLUCOSE 85 06/26/2020 1227   BUN 14 06/26/2020 1227   BUN 8 04/13/2019 1156   CREATININE 1.20 06/26/2020 1227   CALCIUM 9.4 06/26/2020 1227   PROT 6.8 06/26/2020 1227   PROT 6.7 06/13/2019 1337   ALBUMIN 3.2 (L) 03/14/2020 0550   ALBUMIN 4.0 06/13/2019 1337   AST 15 06/26/2020 1227   ALT 17 06/26/2020 1227   ALKPHOS 31 (L) 03/14/2020 0550   BILITOT 1.2 07/23/2020 1541   GFRNONAA 69 06/26/2020 1227   GFRAA 80 06/26/2020 1227   Lab Results  Component Value Date   WBC 10.8 (H) 03/14/2020   HGB 13.7 03/14/2020   HCT 43.3 03/14/2020   MCV 91.7 03/14/2020   PLT 201 03/14/2020    Imaging Studies: No results found.  Assessment and Plan:   Dean Anderson is a 54 y.o. 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  Will fractionate to confirm   H. pylori breath test in May 2021 negative   Dr June 2021

## 2020-07-28 ENCOUNTER — Ambulatory Visit: Payer: Medicaid Other | Admitting: Cardiology

## 2020-07-29 ENCOUNTER — Ambulatory Visit (INDEPENDENT_AMBULATORY_CARE_PROVIDER_SITE_OTHER): Payer: Medicaid Other | Admitting: Cardiology

## 2020-07-29 ENCOUNTER — Encounter: Payer: Self-pay | Admitting: Cardiology

## 2020-07-29 ENCOUNTER — Other Ambulatory Visit: Payer: Self-pay

## 2020-07-29 VITALS — BP 150/100 | HR 65 | Ht 68.0 in | Wt 209.0 lb

## 2020-07-29 DIAGNOSIS — I10 Essential (primary) hypertension: Secondary | ICD-10-CM | POA: Diagnosis not present

## 2020-07-29 DIAGNOSIS — I4819 Other persistent atrial fibrillation: Secondary | ICD-10-CM

## 2020-07-29 MED ORDER — LISINOPRIL 40 MG PO TABS: 40.0000 mg | ORAL_TABLET | Freq: Every day | ORAL | 5 refills | Status: DC

## 2020-07-29 MED ORDER — METOPROLOL TARTRATE 100 MG PO TABS: 50.0000 mg | ORAL_TABLET | Freq: Two times a day (BID) | ORAL | 5 refills | Status: DC

## 2020-07-29 NOTE — Patient Instructions (Signed)
Medication Instructions:   Your physician has recommended you make the following change in your medication:   1.  START taking your lisinopril (ZESTRIL) 40 MG tablet:       Take 1 tablet (40 mg total) by mouth daily. 2.  DECREASE your metoprolol tartrate (LOPRESSOR) 100 MG tablet:      Take 0.5 tablets (50 mg total) by mouth 2 (two) times daily  *If you need a refill on your cardiac medications before your next appointment, please call your pharmacy*   Lab Work: None Ordered If you have labs (blood work) drawn today and your tests are completely normal, you will receive your results only by: Marland Kitchen MyChart Message (if you have MyChart) OR . A paper copy in the mail If you have any lab test that is abnormal or we need to change your treatment, we will call you to review the results.   Testing/Procedures: None Ordered   Follow-Up: At [facility], you and your health needs are our priority.  As part of our continuing mission to provide you with exceptional heart care, we have created designated Provider Care Teams.  These Care Teams include your primary Cardiologist (physician) and Advanced Practice Providers (APPs -  Physician Assistants and Nurse Practitioners) who all work together to provide you with the care you need, when you need it.  We recommend signing up for the patient portal called "MyChart".  Sign up information is provided on this After Visit Summary.  MyChart is used to connect with patients for Virtual Visits (Telemedicine).  Patients are able to view lab/test results, encounter notes, upcoming appointments, etc.  Non-urgent messages can be sent to your provider as well.   To learn more about what you can do with MyChart, go to [email or web address].    Your next appointment:   6 month(s)  The format for your next appointment:   In Person  Provider:   Debbe Odea, MD   Other Instructions
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
## 2020-08-18 ENCOUNTER — Other Ambulatory Visit: Payer: Self-pay | Admitting: Family Medicine

## 2020-08-18 ENCOUNTER — Encounter: Payer: Self-pay | Admitting: Family Medicine

## 2020-08-18 DIAGNOSIS — K449 Diaphragmatic hernia without obstruction or gangrene: Secondary | ICD-10-CM

## 2020-08-18 DIAGNOSIS — K2900 Acute gastritis without bleeding: Secondary | ICD-10-CM

## 2020-08-19 MED ORDER — OMEPRAZOLE 20 MG PO CPDR: 20.0000 mg | DELAYED_RELEASE_CAPSULE | Freq: Every day | ORAL | 3 refills | Status: DC

## 2020-08-22 ENCOUNTER — Other Ambulatory Visit: Payer: Self-pay | Admitting: Family Medicine

## 2020-08-22 MED ORDER — HYDROCHLOROTHIAZIDE 25 MG PO TABS: 25.0000 mg | ORAL_TABLET | Freq: Every day | ORAL | 3 refills | Status: DC

## 2020-08-22 NOTE — Progress Notes (Signed)
Name: Dean Anderson   MRN: [id number]    DOB: 11/12/1966   Date:08/25/2020       Progress Note  Chief Complaint  Patient presents with  . Medication Management     Subjective:   Dean Anderson is a 54 y.o. male, presents to clinic for questions about ED meds since experiencing ED with all his heart and BP meds  Also missed sleep apnea test and need to set this up again- Since pt established here with [place] (transferred from [facility]) we have not referred for this.  He believes the referral may have come from his past pcp when he began to have elevated blood pressure. He does snore, his wife is a Engineer, civil (consulting) - suspects he has apnea/pauses in his breathing.  Hypertension:    BP high at cardiology but good here Currently managed on HCTZ 25 mg, lisinopril 40 mg, metoprolol 100 mg - taking 50 mg BID due to possible SE Pt reports good med compliance and denies any SE.   Blood pressure today is well controlled. BP Readings from Last 3 Encounters:  08/25/20 132/76  07/29/20 (!) 150/100  07/23/20 130/87   Pt denies CP, SOB, exertional sx, LE edema, palpitation, Ha's, visual disturbances, lightheadedness, hypotension, syncope. Dietary efforts for BP?  Trying to eat healthy    Current Outpatient Medications:  .  fluticasone (FLONASE) 50 MCG/ACT nasal spray, Place 2 sprays into both nostrils as needed for allergies or rhinitis. (Patient taking differently: Place 2 sprays into both nostrils daily as needed for allergies or rhinitis. ), Disp: 16 g, Rfl: 5 .  hydrochlorothiazide (HYDRODIURIL) 25 MG tablet, Take 1 tablet (25 mg total) by mouth daily., Disp: 90 tablet, Rfl: 3 .  levothyroxine (SYNTHROID) 88 MCG tablet, TAKE 1 TABLET BY MOUTH ONCE DAILY BEFORE BREAKFAST, Disp: 90 tablet, Rfl: 0 .  lisinopril (ZESTRIL) 40 MG tablet, Take 1 tablet (40 mg total) by mouth daily., Disp: 30 tablet, Rfl: 5 .  metoprolol tartrate (LOPRESSOR) 100 MG tablet, Take 0.5 tablets (50 mg total) by  mouth 2 (two) times daily., Disp: 30 tablet, Rfl: 5 .  omeprazole (PRILOSEC) 20 MG capsule, Take 1 capsule (20 mg total) by mouth at bedtime., Disp: 90 capsule, Rfl: 3  Patient Active Problem List   Diagnosis Date Noted  . Hypothyroidism 06/26/2020  . Hepatic steatosis 06/26/2020  . Hyperlipidemia 06/26/2020  . History of repair of hiatal hernia 03/12/2020  . Hyponatremia 02/20/2020  . Pleural effusion on right 01/11/2020  . Iron deficiency anemia due to chronic blood loss   . Acute gastritis without hemorrhage   . Hematochezia   . Hypertension 10/02/2018  . DOE (dyspnea on exertion) 10/02/2018  . Persistent atrial fibrillation (HCC) 09/21/2018    Past Surgical History:  Procedure Laterality Date  . COLONOSCOPY WITH PROPOFOL N/A 04/17/2019   Procedure: COLONOSCOPY WITH PROPOFOL;  Surgeon: Midge Minium, MD;  Location[facility] ENDOSCOPY;  Service: Endoscopy;  Laterality: N/A;  . ESOPHAGOGASTRODUODENOSCOPY N/A 03/12/2020   Procedure: ESOPHAGOGASTRODUODENOSCOPY (EGD);  Surgeon: Corliss Skains, MD;  Location: [facility] OR;  Service: Thoracic;  Laterality: N/A;  . ESOPHAGOGASTRODUODENOSCOPY (EGD) WITH PROPOFOL N/A 04/17/2019   Procedure: ESOPHAGOGASTRODUODENOSCOPY (EGD) WITH PROPOFOL;  Surgeon: Midge Minium, MD;  Location: [facility] ENDOSCOPY;  Service: Endoscopy;  Laterality: N/A;  . GIVENS CAPSULE STUDY  05/2019   negative  . VIDEO ASSISTED THORACOSCOPY (VATS)/DECORTICATION Right 03/12/2020   Procedure: VIDEO ASSISTED THORACOSCOPY (VATS)/DRAINING OF AN EMPYEMA;  Surgeon: Corliss Skains, MD;  Location: [facility]  OR;  Service: Thoracic;  Laterality: Right;  . XI ROBOTIC ASSISTED HIATAL HERNIA REPAIR N/A 03/12/2020   Procedure: XI ROBOTIC ASSISTED HIATAL HERNIA REPAIR USING ACELL MATRIX HIATAL;  Surgeon: Corliss Skains, MD;  Location: [facility] OR;  Service: Thoracic;  Laterality: N/A;    Family History  Problem Relation Age of Onset  . Hypertension Mother   . Thyroid disease Mother   . Stroke Father 40   . Liver cancer Maternal Uncle   . Heart disease Brother   . Hypertension Brother     Social History   Tobacco Use  . Smoking status: Never Smoker  . Smokeless tobacco: Never Used  Vaping Use  . Vaping Use: Never used  Substance Use Topics  . Alcohol use: Not Currently    Comment: quit ETOH 03/12/2020  . Drug use: Yes    Types: Marijuana    Comment: gummy, last week     No Known Allergies  Health Maintenance  Topic Date Due  . COVID-19 Vaccine (1) 09/10/2020 (Originally 06/04/1980)  . TETANUS/TDAP  12/26/2020 (Originally 06/05/1987)  . HIV Screening  01/06/2021 (Originally 06/05/1983)  . INFLUENZA VACCINE  03/05/2021 (Originally 07/06/2020)  . COLONOSCOPY  04/16/2029  . Hepatitis C Screening  Completed    Chart Review Today: I personally reviewed active problem list, medication list, allergies, family history, social history, health maintenance, notes from last encounter, lab results, imaging with the patient/caregiver today.   Review of Systems  10 Systems reviewed and are negative for acute change except as noted in the HPI.     Objective:   Vitals:   08/25/20 1308  BP: 132/76  Pulse: 86  Resp: 18  Temp: 98.2 F (36.8 C)  TempSrc: Oral  SpO2: 100%  Weight: 210 lb 6.4 oz (95.4 kg)  Height: 5\' 3"  (1.6 m)    Body mass index is 37.27 kg/m.  Physical Exam Vitals and nursing note reviewed.  Constitutional:      General: He is not in acute distress.    Appearance: Normal appearance. He is obese. He is not ill-appearing, toxic-appearing or diaphoretic.  HENT:     Head: Normocephalic and atraumatic.     Right Ear: External ear normal.     Left Ear: External ear normal.  Cardiovascular:     Rate and Rhythm: Normal rate. Rhythm irregularly irregular.     Pulses: Normal pulses.     Heart sounds: Normal heart sounds.  Pulmonary:     Effort: Pulmonary effort is normal. No respiratory distress.     Breath sounds: Normal breath sounds. No wheezing or rhonchi.    Abdominal:     General: Bowel sounds are normal.     Palpations: Abdomen is soft.  Musculoskeletal:     Right lower leg: No edema.     Left lower leg: No edema.  Skin:    General: Skin is warm and dry.  Neurological:     Mental Status: He is alert.     Gait: Gait normal.  Psychiatric:        Mood and Affect: Mood normal.        Behavior: Behavior normal.         Assessment & Plan:     ICD-10-CM   1. 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 Suspected sleep apnea  R29.818 Ambulatory referral to Pulmonology   Pt requests eval for suspected sleep apnea  Patient reports that he had initiated a sleep study not that long ago but he was lost to follow-up, he established with our clinic in the last 6 to 9 months and we did not initiate this but may have been from [facility] and we will have to start over I explained this to him and also explained that be more efficient for him to see one of the specialist who does sleep medicine since we have had significant delays with ordering sleep studies from primary care this year with Covid pandemic  Patient has changed some of his medications I did encourage him to follow-up with his cardiologist regarding med changes and any symptoms or side effects he may be having    Danelle Berry, PA-C 08/25/20 1:15 PM

## 2020-08-25 ENCOUNTER — Encounter: Payer: Self-pay | Admitting: Family Medicine

## 2020-08-25 ENCOUNTER — Ambulatory Visit: Payer: Medicaid Other | Admitting: Family Medicine

## 2020-08-25 ENCOUNTER — Other Ambulatory Visit: Payer: Self-pay

## 2020-08-25 VITALS — BP 132/76 | HR 86 | Temp 98.2°F | Resp 18 | Ht 63.0 in | Wt 210.4 lb

## 2020-08-25 DIAGNOSIS — R29818 Other symptoms and signs involving the nervous system: Secondary | ICD-10-CM

## 2020-08-25 DIAGNOSIS — N522 Drug-induced erectile dysfunction: Secondary | ICD-10-CM | POA: Diagnosis not present

## 2020-08-25 MED ORDER — SILDENAFIL CITRATE 20 MG PO TABS: 20.0000 mg | ORAL_TABLET | Freq: Every day | ORAL | 0 refills | Status: DC | PRN

## 2020-10-18 ENCOUNTER — Other Ambulatory Visit: Payer: Self-pay | Admitting: Family Medicine

## 2020-10-18 DIAGNOSIS — E039 Hypothyroidism, unspecified: Secondary | ICD-10-CM

## 2020-10-28 ENCOUNTER — Other Ambulatory Visit: Payer: Self-pay

## 2020-10-28 ENCOUNTER — Encounter: Payer: Self-pay | Admitting: Pulmonary Disease

## 2020-10-28 ENCOUNTER — Ambulatory Visit (INDEPENDENT_AMBULATORY_CARE_PROVIDER_SITE_OTHER): Payer: Medicaid Other | Admitting: Pulmonary Disease

## 2020-10-28 VITALS — BP 138/82 | HR 80 | Temp 97.5°F | Ht 63.0 in | Wt 221.4 lb

## 2020-10-28 DIAGNOSIS — Z8616 Personal history of COVID-19: Secondary | ICD-10-CM | POA: Insufficient documentation

## 2020-10-28 DIAGNOSIS — Z0000 Encounter for general adult medical examination without abnormal findings: Secondary | ICD-10-CM | POA: Diagnosis not present

## 2020-10-28 DIAGNOSIS — Z9189 Other specified personal risk factors, not elsewhere classified: Secondary | ICD-10-CM | POA: Insufficient documentation

## 2020-10-28 DIAGNOSIS — I4819 Other persistent atrial fibrillation: Secondary | ICD-10-CM

## 2020-10-28 DIAGNOSIS — G4733 Obstructive sleep apnea (adult) (pediatric): Secondary | ICD-10-CM | POA: Insufficient documentation

## 2020-10-28 NOTE — Assessment & Plan Note (Signed)
Patient is unvaccinated seasonal flu vaccine, he declines this today Patient is unvaccinated COVID-19, unvaccinated

## 2020-10-28 NOTE — Assessment & Plan Note (Signed)
Plan: Split-night sleep study ordered given patient's insurance being Medicaid Recommend follow-up in 3 months

## 2020-10-28 NOTE — Patient Instructions (Addendum)
You were seen today by Coral Ceo, NP  for:   1. At risk for obstructive sleep apnea  - Split night study; Future  2. History of COVID-19 3. Healthcare maintenance  We recommend seasonal flu vaccine   We recommend the covid19 vaccine  We recommend today:  Orders Placed This Encounter  Procedures  . Split night study    Standing Status:   Future    Standing Expiration Date:   02/25/2021    Order Specific Question:   Where should this test be performed:    Answer:   [place]   Orders Placed This Encounter  Procedures  . Split night study   No orders of the defined types were placed in this encounter.   Follow Up:    Return in about 3 months (around 01/28/2021), or if symptoms worsen or fail to improve, for Follow up with Elisha Headland FNP-C, [employer].   Notification of test results are managed in the following manner: If there are  any recommendations or changes to the  plan of Anderson discussed in office today,  we will contact you and let you know what they are. If you do not hear from [place], then your results are normal and you can view them through your  MyChart account , or a letter will be sent to you. Thank you again for trusting [place] with your Anderson  - Thank you, [facility]    It is flu season:   >>> Best ways to protect herself from the flu: Receive the yearly flu vaccine, practice good hand hygiene washing with soap and also using hand sanitizer when available, eat a nutritious meals, get adequate rest, hydrate appropriately       Please contact the office if your symptoms worsen or you have concerns that you are not improving.   Thank you for choosing Dean Anderson for your healthcare, and for allowing [place] to partner with you on your healthcare journey. I am thankful to be able to provide Anderson to you today.   Elisha Headland FNP-C    Sleep Apnea Sleep apnea affects breathing during sleep. It causes breathing to stop for a short time  or to become shallow. It can also increase the risk of:  Heart attack.  Stroke.  Being very overweight (obese).  Diabetes.  Heart failure.  Irregular heartbeat. The goal of treatment is to help you breathe normally again. What are the causes? There are three kinds of sleep apnea:  Obstructive sleep apnea. This is caused by a blocked or collapsed airway.  Central sleep apnea. This happens when the brain does not send the right signals to the muscles that control breathing.  Mixed sleep apnea. This is a combination of obstructive and central sleep apnea. The most common cause of this condition is a collapsed or blocked airway. This can happen if:  Your throat muscles are too relaxed.  Your tongue and tonsils are too large.  You are overweight.  Your airway is too small. What increases the risk?  Being overweight.  Smoking.  Having a small airway.  Being older.  Being male.  Drinking alcohol.  Taking medicines to calm yourself (sedatives or tranquilizers).  Having family members with the condition. What are the signs or symptoms?  Trouble staying asleep.  Being sleepy or tired during the day.  Getting angry a lot.  Loud snoring.  Headaches in the morning.  Not being able to focus your mind (concentrate).  Forgetting things.  Less interest in sex.  Mood swings.  Personality changes.  Feelings of sadness (depression).  Waking up a lot during the night to pee (urinate).  Dry mouth.  Sore throat. How is this diagnosed?  Your medical history.  A physical exam.  A test that is done when you are sleeping (sleep study). The test is most often done in a sleep lab but may also be done at home. How is this treated?   Sleeping on your side.  Using a medicine to get rid of mucus in your nose (decongestant).  Avoiding the use of alcohol, medicines to help you relax, or certain pain medicines (narcotics).  Losing weight, if  needed.  Changing your diet.  Not smoking.  Using a machine to open your airway while you sleep, such as: ? An oral appliance. This is a mouthpiece that shifts your lower jaw forward. ? A CPAP device. This device blows air through a mask when you breathe out (exhale). ? An EPAP device. This has valves that you put in each nostril. ? A BPAP device. This device blows air through a mask when you breathe in (inhale) and breathe out.  Having surgery if other treatments do not work. It is important to get treatment for sleep apnea. Without treatment, it can lead to:  High blood pressure.  Coronary artery disease.  In men, not being able to have an erection (impotence).  Reduced thinking ability. Follow these instructions at home: Lifestyle  Make changes that your doctor recommends.  Eat a healthy diet.  Lose weight if needed.  Avoid alcohol, medicines to help you relax, and some pain medicines.  Do not use any products that contain nicotine or tobacco, such as cigarettes, e-cigarettes, and chewing tobacco. If you need help quitting, ask your doctor. General instructions  Take over-the-counter and prescription medicines only as told by your doctor.  If you were given a machine to use while you sleep, use it only as told by your doctor.  If you are having surgery, make sure to tell your doctor you have sleep apnea. You may need to bring your device with you.  Keep all follow-up visits as told by your doctor. This is important. Contact a doctor if:  The machine that you were given to use during sleep bothers you or does not seem to be working.  You do not get better.  You get worse. Get help right away if:  Your chest hurts.  You have trouble breathing in enough air.  You have an uncomfortable feeling in your back, arms, or stomach.  You have trouble talking.  One side of your body feels weak.  A part of your face is hanging down. These symptoms may be an  emergency. Do not wait to see if the symptoms will go away. Get medical help right away. Call your local emergency services (911 in the [place] Do not drive yourself to the hospital. Summary  This condition affects breathing during sleep.  The most common cause is a collapsed or blocked airway.  The goal of treatment is to help you breathe normally while you sleep. This information is not intended to replace advice given to you by your health Anderson provider. Make sure you discuss any questions you have with your health Anderson provider. Document Revised: 09/08/2018 Document Reviewed: 07/18/2018 Elsevier Patient Education  2020 [employer].

## 2020-10-28 NOTE — Progress Notes (Signed)
@Patient  ID: , male    DOB: Oct 30, 1966, 54 y.o.   MRN: [id number]  Chief Complaint  Patient presents with  . SlEEP CONSULT    Per [id number], PA--no prior sleep study. c/o loud snoring, stops breathing during sleep, restless sleep and daytime sleepiness x3-4y    Referring provider: Danelle Berry, PA-C  HPI:  54 year old male never smoker initially referred to our office on 10/28/2020 for sleep consult.  PMH: Hypertension, hyponatremia, hypothyroidism Smoker/ Smoking History: Never smoker Maintenance: None Pt of: Needs sleep MD, established with Dr. 10/30/2020 for pulmonary needs  10/28/2020  - Visit   54 year old male never smoker referred to our office on 10/28/2020 for concerns of potential sleep apnea as a sleep consult.  Patient has reports of restless sleep.  He snores.  And also have had witnessed apneic events.  He is established with our pulmonary team Dr. 10/30/2020 for dyspnea on exertion.  He is presenting today for evaluation of sleep.  Patient reporting ongoing snoring, apneic episodes.  He has history of A. fib and this is been poorly controlled over the last year.  He is unsure if this is contributing.  We will discuss and review this today.  Epworth score today is 6  Patient remains unvaccinated COVID-19 as well as the flu vaccine.    Questionaires / Pulmonary Flowsheets:   ACT:  No flowsheet data found.  MMRC: No flowsheet data found.  Epworth:  Results of the Epworth flowsheet 10/28/2020 08/25/2020  Sitting and reading 1 3  Watching TV 0 1  Sitting, inactive in a public place (e.g. a theatre or a meeting) 0 0  As a passenger in a car for an hour without a break 2 3  Lying down to rest in the afternoon when circumstances permit 2 0  Sitting and talking to someone 0 0  Sitting quietly after a lunch without alcohol 1 1  In a car, while stopped for a few minutes in traffic 0 0  Total score 6 8    Tests:   FENO:  No results found for:  NITRICOXIDE  PFT: No flowsheet data found.  WALK:  No flowsheet data found.  Imaging: No results found.  Lab Results:  CBC    Component Value Date/Time   WBC 10.8 (H) 03/14/2020 0130   RBC 4.72 03/14/2020 0130   HGB 13.7 03/14/2020 0130   HGB 13.0 04/26/2019 1116   HCT 43.3 03/14/2020 0130   HCT 42.9 04/26/2019 1116   PLT 201 03/14/2020 0130   PLT 296 04/26/2019 1116   MCV 91.7 03/14/2020 0130   MCV 78 (L) 04/26/2019 1116   MCH 29.0 03/14/2020 0130   MCHC 31.6 03/14/2020 0130   RDW 13.7 03/14/2020 0130   LYMPHSABS 0.8 02/19/2020 1030   MONOABS 0.9 02/19/2020 1030   EOSABS 0.2 02/19/2020 1030   BASOSABS 0.1 02/19/2020 1030    BMET    Component Value Date/Time   NA 129 (L) 06/26/2020 1227   NA 138 04/13/2019 1156   K 4.8 06/26/2020 1227   CL 94 (L) 06/26/2020 1227   CO2 27 06/26/2020 1227   GLUCOSE 85 06/26/2020 1227   BUN 14 06/26/2020 1227   BUN 8 04/13/2019 1156   CREATININE 1.20 06/26/2020 1227   CALCIUM 9.4 06/26/2020 1227   GFRNONAA 69 06/26/2020 1227   GFRAA 80 06/26/2020 1227    BNP    Component Value Date/Time   BNP 156.0 (H) 01/07/2020 1722    ProBNP No  results found for: PROBNP  Specialty Problems      Pulmonary Problems   DOE (dyspnea on exertion)   Pleural effusion on right     Onset of symptoms early 2021 assoc with hbp/afib with neg cards w/u 02/14/20  - Thoracentesis 02/20/2020 >>>   2000 cc  Wbc 267  L > polys  Exudative by protein  with glucose wnl  Cytology >  Still large effusion  Present with loculations > refer to T surgery for ? VATS         No Known Allergies  Immunization History  Administered Date(s) Administered  . Pneumococcal Polysaccharide-23 03/16/2020    Past Medical History:  Diagnosis Date  . Anemia   . Atrial fibrillation (HCC)   . Dyspnea   . Family history of adverse reaction to anesthesia    brother woke up in a rage after geting anesthesia for teeth surgery  . GERD (gastroesophageal reflux  disease)   . History of hiatal hernia   . Hypertension   . Hypothyroidism   . Thyroid disease     Tobacco History: Social History   Tobacco Use  Smoking Status Never Smoker  Smokeless Tobacco Never Used   Counseling given: Not Answered   Continue to not smoke  Outpatient Encounter Medications as of 10/28/2020  Medication Sig  . EUTHYROX 88 MCG tablet TAKE 1 TABLET BY MOUTH ONCE DAILY BEFORE BREAKFAST  . fluticasone (FLONASE) 50 MCG/ACT nasal spray Place 2 sprays into both nostrils as needed for allergies or rhinitis. (Patient taking differently: Place 2 sprays into both nostrils daily as needed for allergies or rhinitis. )  . hydrochlorothiazide (HYDRODIURIL) 25 MG tablet Take 1 tablet (25 mg total) by mouth daily.  Marland Kitchen lisinopril (ZESTRIL) 40 MG tablet Take 1 tablet (40 mg total) by mouth daily.  . metoprolol tartrate (LOPRESSOR) 100 MG tablet Take 0.5 tablets (50 mg total) by mouth 2 (two) times daily.  Marland Kitchen omeprazole (PRILOSEC) 20 MG capsule Take 1 capsule (20 mg total) by mouth at bedtime.  . sildenafil (REVATIO) 20 MG tablet Take 1-5 tablets (20-100 mg total) by mouth daily as needed (30 min prior to sexual activity).   No facility-administered encounter medications on file as of 10/28/2020.     Review of Systems  Review of Systems  Constitutional: Negative for activity change, chills, fatigue, fever and unexpected weight change.  HENT: Negative for postnasal drip, rhinorrhea, sinus pressure, sinus pain and sore throat.   Eyes: Negative.   Respiratory: Negative for cough, shortness of breath and wheezing.   Cardiovascular: Negative for chest pain and palpitations.  Gastrointestinal: Negative for constipation, diarrhea, nausea and vomiting.  Endocrine: Negative.   Genitourinary: Negative.   Musculoskeletal: Negative.   Skin: Negative.   Neurological: Negative for dizziness and headaches.  Psychiatric/Behavioral: Negative.  Negative for dysphoric mood. The patient is not  nervous/anxious.   All other systems reviewed and are negative.    Physical Exam  BP 138/82 (BP Location: Left Arm, Cuff Size: Normal)   Pulse 80   Temp (!) 97.5 F (36.4 C) (Temporal)   Ht 5\' 3"  (1.6 m)   Wt 221 lb 6.4 oz (100.4 kg)   SpO2 99%   BMI 39.22 kg/m   Wt Readings from Last 5 Encounters:  10/28/20 221 lb 6.4 oz (100.4 kg)  08/25/20 210 lb 6.4 oz (95.4 kg)  07/29/20 209 lb (94.8 kg)  07/23/20 211 lb (95.7 kg)  06/26/20 (!) 209 lb (94.8 kg)    BMI  Readings from Last 5 Encounters:  10/28/20 39.22 kg/m  08/25/20 37.27 kg/m  07/29/20 31.78 kg/m  07/23/20 32.08 kg/m  06/26/20 31.78 kg/m     Physical Exam Vitals and nursing note reviewed.  Constitutional:      General: He is not in acute distress.    Appearance: Normal appearance. He is obese.  HENT:     Head: Normocephalic and atraumatic.     Right Ear: Hearing and external ear normal.     Left Ear: Hearing and external ear normal.     Nose: No mucosal edema.     Right Turbinates: Not enlarged.     Left Turbinates: Not enlarged.     Mouth/Throat:     Mouth: Mucous membranes are dry.     Pharynx: Oropharynx is clear. No oropharyngeal exudate.     Comments: MP2 Eyes:     Pupils: Pupils are equal, round, and reactive to light.  Cardiovascular:     Rate and Rhythm: Normal rate. Rhythm regularly irregular.     Pulses: Normal pulses.     Heart sounds: Normal heart sounds. No murmur heard.   Pulmonary:     Effort: Pulmonary effort is normal.     Breath sounds: Normal breath sounds. No decreased breath sounds, wheezing or rales.  Musculoskeletal:     Cervical back: Normal range of motion.     Right lower leg: No edema.     Left lower leg: No edema.  Lymphadenopathy:     Cervical: No cervical adenopathy.  Skin:    General: Skin is warm and dry.     Capillary Refill: Capillary refill takes less than 2 seconds.     Findings: No erythema or rash.  Neurological:     General: No focal deficit present.      Mental Status: He is alert and oriented to person, place, and time.     Motor: No weakness.     Coordination: Coordination normal.     Gait: Gait is intact. Gait normal.  Psychiatric:        Mood and Affect: Mood normal.        Behavior: Behavior normal. Behavior is cooperative.        Thought Content: Thought content normal.        Judgment: Judgment normal.       Assessment & Plan:   Persistent atrial fibrillation (HCC) Plan: Remain established with cardiology Reviewed with patient today that uncontrolled obstructive sleep apnea can affect atrial fibrillation  At risk for obstructive sleep apnea Plan: Split-night sleep study ordered given patient's insurance being Medicaid Recommend follow-up in 3 months  Healthcare maintenance Patient is unvaccinated seasonal flu vaccine, he declines this today Patient is unvaccinated COVID-19, unvaccinated  History of COVID-19 History of COVID-19 Did not require hospitalization Did not require mechanical ventilation Did not receive monoclonal antibody infusion  Patient remains unvaccinated COVID-19  Plan: We will continue to clinically monitor    Return in about 3 months (around 01/28/2021), or if symptoms worsen or fail to improve, for Follow up with Elisha Headland FNP-C, [employer].   Coral Ceo, NP 10/28/2020   This appointment required 32 minutes of patient care (this includes precharting, chart review, review of results, face-to-face care, etc.).

## 2020-10-28 NOTE — Assessment & Plan Note (Signed)
History of COVID-19 Did not require hospitalization Did not require mechanical ventilation Did not receive monoclonal antibody infusion  Patient remains unvaccinated COVID-19  Plan: We will continue to clinically monitor

## 2020-10-28 NOTE — Assessment & Plan Note (Signed)
Plan: Remain established with cardiology Reviewed with patient today that uncontrolled obstructive sleep apnea can affect atrial fibrillation

## 2020-10-29 NOTE — Progress Notes (Signed)
Reviewed and agree with assessment/plan.   Khallid Pasillas, MD [facility] 10/29/2020, 7:28 PM Pager:  [phone]  

## 2020-11-13 ENCOUNTER — Ambulatory Visit: Payer: Medicaid Other | Attending: Pulmonary Disease

## 2020-11-13 DIAGNOSIS — G4733 Obstructive sleep apnea (adult) (pediatric): Secondary | ICD-10-CM | POA: Diagnosis not present

## 2020-11-13 DIAGNOSIS — G4710 Hypersomnia, unspecified: Secondary | ICD-10-CM | POA: Diagnosis present

## 2020-11-13 DIAGNOSIS — I4891 Unspecified atrial fibrillation: Secondary | ICD-10-CM | POA: Diagnosis not present

## 2020-11-14 ENCOUNTER — Other Ambulatory Visit: Payer: Self-pay

## 2020-11-26 ENCOUNTER — Other Ambulatory Visit: Payer: Self-pay | Admitting: Family Medicine

## 2020-11-26 MED ORDER — SILDENAFIL CITRATE 20 MG PO TABS: 20.0000 mg | ORAL_TABLET | Freq: Every day | ORAL | 1 refills | Status: DC | PRN

## 2020-11-26 NOTE — Telephone Encounter (Signed)
Medication Refill - Medication: sildenafil (REVATIO) 20 MG tablet    Preferred Pharmacy (with phone number or street name):  [facility] [address] [place]), [place] - [address] Phone:  [phone]  Fax:  [phone]       Agent: Please be advised that RX refills may take up to 3 business days. We ask that you follow-up with your pharmacy.

## 2020-11-27 ENCOUNTER — Other Ambulatory Visit: Payer: Self-pay | Admitting: Family Medicine

## 2020-12-16 ENCOUNTER — Telehealth (INDEPENDENT_AMBULATORY_CARE_PROVIDER_SITE_OTHER): Payer: Medicaid Other | Admitting: Pulmonary Disease

## 2020-12-16 DIAGNOSIS — G4733 Obstructive sleep apnea (adult) (pediatric): Secondary | ICD-10-CM

## 2020-12-16 NOTE — Telephone Encounter (Signed)
NPSG showed moderate OSA, AHI 20/hour Suggest auto CPAP 5 to 15 cm may be adequate, versus formal CPAP titration

## 2020-12-17 NOTE — Telephone Encounter (Signed)
Lm for patient.  

## 2020-12-17 NOTE — Telephone Encounter (Signed)
Agree with recommendations by Dr. Vassie Loll.  If patient is agreeable continue CPAP start APAP setting 5-15 Mask of choice Supplies Patient will need follow-up in 2 to 3 months in our office after starting CPAP therapy.  As always of the patient would like to discuss this in more detail.  Can be scheduled as a telephone visit or an in office visit in [place] or [place].  Elisha Headland, FNP

## 2020-12-17 NOTE — Telephone Encounter (Signed)
Will await Brian's recommendations.

## 2020-12-18 ENCOUNTER — Telehealth: Payer: Self-pay | Admitting: Pulmonary Disease

## 2020-12-18 NOTE — Telephone Encounter (Signed)
Lm for patient.  

## 2020-12-19 NOTE — Telephone Encounter (Signed)
Please see 12/16/2020 phone note.   Lm x2 for patient.  Will close encounter per office protocol. Letter has been mailed to address on file.

## 2020-12-19 NOTE — Telephone Encounter (Signed)
Lm x2 for patient. Letter has been mailed to address on file per office protocol.   

## 2020-12-29 ENCOUNTER — Ambulatory Visit: Payer: Medicaid Other | Admitting: Family Medicine

## 2021-01-06 ENCOUNTER — Encounter: Payer: Self-pay | Admitting: Pulmonary Disease

## 2021-01-06 ENCOUNTER — Ambulatory Visit (INDEPENDENT_AMBULATORY_CARE_PROVIDER_SITE_OTHER): Payer: Medicaid Other | Admitting: Pulmonary Disease

## 2021-01-06 ENCOUNTER — Other Ambulatory Visit: Payer: Self-pay

## 2021-01-06 VITALS — BP 136/80 | HR 65 | Temp 97.3°F | Ht 63.0 in | Wt 227.0 lb

## 2021-01-06 DIAGNOSIS — G4733 Obstructive sleep apnea (adult) (pediatric): Secondary | ICD-10-CM

## 2021-01-06 DIAGNOSIS — I4819 Other persistent atrial fibrillation: Secondary | ICD-10-CM | POA: Diagnosis not present

## 2021-01-06 DIAGNOSIS — Z8616 Personal history of COVID-19: Secondary | ICD-10-CM

## 2021-01-06 NOTE — Patient Instructions (Addendum)
You were seen today by Coral Ceo, NP  for:   1. OSA (obstructive sleep apnea)  - AMB REFERRAL FOR DME  We recommend that you start using your CPAP daily >>>Keep up the hard work using your device >>> Goal should be wearing this for the entire night that you are sleeping, at least 4 to 6 hours  Remember:  . Do not drive or operate heavy machinery if tired or drowsy.  . Please notify the supply company and office if you are unable to use your device regularly due to missing supplies or machine being broken.  . Work on maintaining a healthy weight and following your recommended nutrition plan  . Maintain proper daily exercise and movement  . Maintaining proper use of your device can also help improve management of other chronic illnesses such as: Blood pressure, blood sugars, and weight management.   BiPAP/ CPAP Cleaning:  >>>Clean weekly, with Dawn soap, and bottle brush.  Set up to air dry. >>> Wipe mask out daily with wet wipe or towelette   2. History of COVID-19  Continue to work on improving your overall physical activity  3. Persistent atrial fibrillation (HCC)  Keep follow-up with cardiology   We recommend today:  Orders Placed This Encounter  Procedures  . AMB REFERRAL FOR DME    Referral Priority:   Routine    Referral Type:   Durable Medical Equipment Purchase    Number of Visits Requested:   1   Orders Placed This Encounter  Procedures  . AMB REFERRAL FOR DME   No orders of the defined types were placed in this encounter.   Follow Up:    Return in about 3 months (around 04/05/2021), or if symptoms worsen or fail to improve, for [facility].   Notification of test results are managed in the following manner: If there are  any recommendations or changes to the  plan of care discussed in office today,  we will contact you and let you know what they are. If you do not hear from [place], then your results are normal and you can view them through  your  MyChart account , or a letter will be sent to you. Thank you again for trusting [place] with your care  - Thank you, [facility]    It is flu season:   >>> Best ways to protect herself from the flu: Receive the yearly flu vaccine, practice good hand hygiene washing with soap and also using hand sanitizer when available, eat a nutritious meals, get adequate rest, hydrate appropriately       Please contact the office if your symptoms worsen or you have concerns that you are not improving.   Thank you for choosing [facility] for your healthcare, and for allowing [place] to partner with you on your healthcare journey. I am thankful to be able to provide care to you today.   Elisha Headland FNP-C

## 2021-01-06 NOTE — Assessment & Plan Note (Signed)
Plan: Recommend COVID-19 vaccinations, patient declines

## 2021-01-06 NOTE — Progress Notes (Signed)
Reviewed and agree with assessment/plan.   Coralyn Helling, MD [facility]/Critical Care 01/06/2021, 4:40 PM Pager:  [phone]

## 2021-01-06 NOTE — Progress Notes (Signed)
@Patient  ID: , male    DOB: 1966/07/12, 55 y.o.   MRN: [id number]  Chief Complaint  Patient presents with  . Follow-up    Review sleep study.     Referring provider: [id number], PA-C  HPI:  55 year old male never smoker initially referred to our office on 10/28/2020 for sleep consult.  PMH: Hypertension, hyponatremia, hypothyroidism Smoker/ Smoking History: Never smoker Maintenance: None Pt of: Needs sleep MD, established with Dr. 10/30/2020 for pulmonary needs  01/06/2021  - Visit   55 year old male never smoker presenting today as a follow-up after obtaining a January/2022 sleep study.  Which showed moderate obstructive sleep apnea with an AHI of 20.  Patient has no current respiratory complaints he would like to discuss his sleep study results and discuss treatment options.  Questionaires / Pulmonary Flowsheets:   ACT:  No flowsheet data found.  MMRC: No flowsheet data found.  Epworth:  Results of the Epworth flowsheet 10/28/2020 08/25/2020  Sitting and reading 1 3  Watching TV 0 1  Sitting, inactive in a public place (e.g. a theatre or a meeting) 0 0  As a passenger in a car for an hour without a break 2 3  Lying down to rest in the afternoon when circumstances permit 2 0  Sitting and talking to someone 0 0  Sitting quietly after a lunch without alcohol 1 1  In a car, while stopped for a few minutes in traffic 0 0  Total score 6 8    Tests:   FENO:  No results found for: NITRICOXIDE  PFT: No flowsheet data found.  WALK:  No flowsheet data found.  Imaging: SLEEP STUDY DOCUMENTS  Result Date: 12/18/2020 Ordered by an unspecified provider.   Lab Results:  CBC    Component Value Date/Time   WBC 10.8 (H) 03/14/2020 0130   RBC 4.72 03/14/2020 0130   HGB 13.7 03/14/2020 0130   HGB 13.0 04/26/2019 1116   HCT 43.3 03/14/2020 0130   HCT 42.9 04/26/2019 1116   PLT 201 03/14/2020 0130   PLT 296 04/26/2019 1116   MCV 91.7 03/14/2020 0130    MCV 78 (L) 04/26/2019 1116   MCH 29.0 03/14/2020 0130   MCHC 31.6 03/14/2020 0130   RDW 13.7 03/14/2020 0130   LYMPHSABS 0.8 02/19/2020 1030   MONOABS 0.9 02/19/2020 1030   EOSABS 0.2 02/19/2020 1030   BASOSABS 0.1 02/19/2020 1030    BMET    Component Value Date/Time   NA 129 (L) 06/26/2020 1227   NA 138 04/13/2019 1156   K 4.8 06/26/2020 1227   CL 94 (L) 06/26/2020 1227   CO2 27 06/26/2020 1227   GLUCOSE 85 06/26/2020 1227   BUN 14 06/26/2020 1227   BUN 8 04/13/2019 1156   CREATININE 1.20 06/26/2020 1227   CALCIUM 9.4 06/26/2020 1227   GFRNONAA 69 06/26/2020 1227   GFRAA 80 06/26/2020 1227    BNP    Component Value Date/Time   BNP 156.0 (H) 01/07/2020 1722    ProBNP No results found for: PROBNP  Specialty Problems      Pulmonary Problems   DOE (dyspnea on exertion)   Pleural effusion on right     Onset of symptoms early 2021 assoc with hbp/afib with neg cards w/u 02/14/20  - Thoracentesis 02/20/2020 >>>   2000 cc  Wbc 267  L > polys  Exudative by protein  with glucose wnl  Cytology >  Still large effusion  Present with loculations > refer  to T surgery for ? VATS      OSA (obstructive sleep apnea)      No Known Allergies  Immunization History  Administered Date(s) Administered  . Pneumococcal Polysaccharide-23 03/16/2020    Past Medical History:  Diagnosis Date  . Anemia   . Atrial fibrillation (HCC)   . Dyspnea   . Family history of adverse reaction to anesthesia    brother woke up in a rage after geting anesthesia for teeth surgery  . GERD (gastroesophageal reflux disease)   . History of hiatal hernia   . Hypertension   . Hypothyroidism   . Thyroid disease     Tobacco History: Social History   Tobacco Use  Smoking Status Never Smoker  Smokeless Tobacco Never Used   Counseling given: Not Answered   Continue to not smoke  Outpatient Encounter Medications as of 01/06/2021  Medication Sig  . EUTHYROX 88 MCG tablet TAKE 1 TABLET BY MOUTH  ONCE DAILY BEFORE BREAKFAST  . fluticasone (FLONASE) 50 MCG/ACT nasal spray Place 2 sprays into both nostrils as needed for allergies or rhinitis. (Patient taking differently: Place 2 sprays into both nostrils daily as needed for allergies or rhinitis.)  . hydrochlorothiazide (HYDRODIURIL) 25 MG tablet Take 1 tablet (25 mg total) by mouth daily.  Marland Kitchen lisinopril (ZESTRIL) 40 MG tablet Take 1 tablet (40 mg total) by mouth daily.  . metoprolol tartrate (LOPRESSOR) 100 MG tablet Take 0.5 tablets (50 mg total) by mouth 2 (two) times daily.  Marland Kitchen omeprazole (PRILOSEC) 20 MG capsule Take 1 capsule (20 mg total) by mouth at bedtime.  . sildenafil (REVATIO) 20 MG tablet Take 1-5 tablets (20-100 mg total) by mouth daily as needed (30 min prior to sexual activity).   No facility-administered encounter medications on file as of 01/06/2021.     Review of Systems  Review of Systems  Constitutional: Negative for activity change, chills, fatigue, fever and unexpected weight change.  HENT: Negative for postnasal drip, rhinorrhea, sinus pressure, sinus pain and sore throat.   Eyes: Negative.   Respiratory: Negative for cough, shortness of breath and wheezing.   Cardiovascular: Negative for chest pain and palpitations.  Gastrointestinal: Negative for constipation, diarrhea, nausea and vomiting.  Endocrine: Negative.   Genitourinary: Negative.   Musculoskeletal: Negative.   Skin: Negative.   Neurological: Negative for dizziness and headaches.  Psychiatric/Behavioral: Negative.  Negative for dysphoric mood. The patient is not nervous/anxious.   All other systems reviewed and are negative.    Physical Exam  BP 136/80 (BP Location: Left Arm, Cuff Size: Normal)   Pulse 65   Temp (!) 97.3 F (36.3 C) (Temporal)   Ht 5\' 3"  (1.6 m)   Wt 227 lb (103 kg)   SpO2 99%   BMI 40.21 kg/m   Wt Readings from Last 5 Encounters:  01/06/21 227 lb (103 kg)  10/28/20 221 lb 6.4 oz (100.4 kg)  08/25/20 210 lb 6.4 oz  (95.4 kg)  07/29/20 209 lb (94.8 kg)  07/23/20 211 lb (95.7 kg)    BMI Readings from Last 5 Encounters:  01/06/21 40.21 kg/m  10/28/20 39.22 kg/m  08/25/20 37.27 kg/m  07/29/20 31.78 kg/m  07/23/20 32.08 kg/m     Physical Exam Vitals and nursing note reviewed.  Constitutional:      General: He is not in acute distress.    Appearance: Normal appearance. He is obese.  HENT:     Head: Normocephalic and atraumatic.     Right Ear: Hearing and external  ear normal.     Left Ear: Hearing and external ear normal.     Nose: Nose normal. No mucosal edema or rhinorrhea.     Right Turbinates: Not enlarged.     Left Turbinates: Not enlarged.     Mouth/Throat:     Mouth: Mucous membranes are dry.     Pharynx: Oropharynx is clear. No oropharyngeal exudate.  Eyes:     Pupils: Pupils are equal, round, and reactive to light.  Cardiovascular:     Rate and Rhythm: Normal rate and regular rhythm.     Pulses: Normal pulses.     Heart sounds: Normal heart sounds. No murmur heard.   Pulmonary:     Effort: Pulmonary effort is normal.     Breath sounds: Normal breath sounds. No decreased breath sounds, wheezing or rales.  Musculoskeletal:     Cervical back: Normal range of motion.     Right lower leg: No edema.     Left lower leg: No edema.  Lymphadenopathy:     Cervical: No cervical adenopathy.  Skin:    General: Skin is warm and dry.     Capillary Refill: Capillary refill takes less than 2 seconds.     Findings: No erythema or rash.  Neurological:     General: No focal deficit present.     Mental Status: He is alert and oriented to person, place, and time.     Motor: No weakness.     Coordination: Coordination normal.     Gait: Gait is intact. Gait normal.  Psychiatric:        Mood and Affect: Mood normal.        Behavior: Behavior normal. Behavior is cooperative.        Thought Content: Thought content normal.        Judgment: Judgment normal.       Assessment & Plan:    History of COVID-19 Plan: Recommend COVID-19 vaccinations, patient declines  Persistent atrial fibrillation (HCC) Plan: Start CPAP therapy Continue follow-up with cardiology   OSA (obstructive sleep apnea) Plan: Start CPAP therapy    Return in about 3 months (around 04/05/2021), or if symptoms worsen or fail to improve, for [facility].   Coral Ceo, NP 01/06/2021   This appointment required 32 minutes of patient care (this includes precharting, chart review, review of results, face-to-face care, etc.).

## 2021-01-06 NOTE — Assessment & Plan Note (Signed)
Plan: Start CPAP therapy

## 2021-01-06 NOTE — Assessment & Plan Note (Signed)
Plan: Start CPAP therapy Continue follow-up with cardiology

## 2021-01-19 ENCOUNTER — Other Ambulatory Visit: Payer: Self-pay

## 2021-01-19 ENCOUNTER — Encounter: Payer: Self-pay | Admitting: Family Medicine

## 2021-01-19 ENCOUNTER — Ambulatory Visit: Payer: Medicaid Other | Admitting: Family Medicine

## 2021-01-19 VITALS — BP 126/78 | HR 88 | Temp 98.1°F | Resp 18 | Ht 68.0 in | Wt 227.0 lb

## 2021-01-19 DIAGNOSIS — R0600 Dyspnea, unspecified: Secondary | ICD-10-CM

## 2021-01-19 DIAGNOSIS — R739 Hyperglycemia, unspecified: Secondary | ICD-10-CM

## 2021-01-19 DIAGNOSIS — D509 Iron deficiency anemia, unspecified: Secondary | ICD-10-CM | POA: Diagnosis not present

## 2021-01-19 DIAGNOSIS — Z114 Encounter for screening for human immunodeficiency virus [HIV]: Secondary | ICD-10-CM | POA: Diagnosis not present

## 2021-01-19 DIAGNOSIS — I10 Essential (primary) hypertension: Secondary | ICD-10-CM

## 2021-01-19 DIAGNOSIS — I4819 Other persistent atrial fibrillation: Secondary | ICD-10-CM | POA: Diagnosis not present

## 2021-01-19 DIAGNOSIS — E785 Hyperlipidemia, unspecified: Secondary | ICD-10-CM

## 2021-01-19 DIAGNOSIS — R7989 Other specified abnormal findings of blood chemistry: Secondary | ICD-10-CM | POA: Diagnosis not present

## 2021-01-19 DIAGNOSIS — K2270 Barrett's esophagus without dysplasia: Secondary | ICD-10-CM

## 2021-01-19 DIAGNOSIS — R0609 Other forms of dyspnea: Secondary | ICD-10-CM

## 2021-01-19 DIAGNOSIS — Z5181 Encounter for therapeutic drug level monitoring: Secondary | ICD-10-CM | POA: Diagnosis not present

## 2021-01-19 DIAGNOSIS — E039 Hypothyroidism, unspecified: Secondary | ICD-10-CM | POA: Diagnosis not present

## 2021-01-19 MED ORDER — SILDENAFIL CITRATE 20 MG PO TABS: 100.0000 mg | ORAL_TABLET | Freq: Every day | ORAL | 1 refills | Status: DC | PRN

## 2021-01-19 MED ORDER — FLUTICASONE PROPIONATE 50 MCG/ACT NA SUSP: 2.0000 | Freq: Every day | NASAL | 5 refills | Status: DC | PRN

## 2021-01-19 NOTE — Progress Notes (Signed)
Name: Dean Anderson   MRN: [id number]    DOB: [birth year]   Date:01/19/2021       Progress Note  Chief Complaint  Patient presents with  . Hypertension  . Hyperlipidemia     Subjective:   Dean Anderson is a 55 y.o. male, presents to clinic for routine f/up  Hypertension:  Currently managed on BB per cardilogy and lisinopril 40 mg Pt reports good med compliance and denies any SE.   Blood pressure today is well controlled. BP Readings from Last 3 Encounters:  01/19/21 126/78  01/06/21 136/80  10/28/20 138/82  Pt denies CP, exertional sx, LE edema, Ha's, visual disturbances, lightheadedness, hypotension, syncope. Recent a fib, pleural effusion -palpitations are controlled and he does not feel any tachycardia but he does have some mild dyspnea on exertion   Hyperlipidemia: Not currently on meds Last Lipids: Lab Results  Component Value Date   CHOL 191 06/26/2020   HDL 55 06/26/2020   LDLCALC 109 (H) 06/26/2020   TRIG 154 (H) 06/26/2020   CHOLHDL 3.5 06/26/2020   - Denies: Chest pain,myalgias, claudication  Hypothyroidism:  Current Medication Regimen: 88 mcg synthroid Current Symptoms: denies fatigue, weight changes, heat/cold intolerance, bowel/skin changes or CVS symptoms Most recent results are below; we will be repeating labs today. Lab Results  Component Value Date   TSH 2.86 06/26/2020   GERD - well controlled with omeprazole     Current Outpatient Medications:  .  EUTHYROX 88 MCG tablet, TAKE 1 TABLET BY MOUTH ONCE DAILY BEFORE BREAKFAST, Disp: 90 tablet, Rfl: 1 .  fluticasone (FLONASE) 50 MCG/ACT nasal spray, Place 2 sprays into both nostrils as needed for allergies or rhinitis. (Patient taking differently: Place 2 sprays into both nostrils daily as needed for allergies or rhinitis.), Disp: 16 g, Rfl: 5 .  hydrochlorothiazide (HYDRODIURIL) 25 MG tablet, Take 1 tablet (25 mg total) by mouth daily., Disp: 90 tablet, Rfl: 3 .  lisinopril (ZESTRIL) 40 MG  tablet, Take 1 tablet (40 mg total) by mouth daily., Disp: 30 tablet, Rfl: 5 .  metoprolol tartrate (LOPRESSOR) 100 MG tablet, Take 0.5 tablets (50 mg total) by mouth 2 (two) times daily., Disp: 30 tablet, Rfl: 5 .  omeprazole (PRILOSEC) 20 MG capsule, Take 1 capsule (20 mg total) by mouth at bedtime., Disp: 90 capsule, Rfl: 3 .  sildenafil (REVATIO) 20 MG tablet, Take 1-5 tablets (20-100 mg total) by mouth daily as needed (30 min prior to sexual activity)., Disp: 30 tablet, Rfl: 1  Patient Active Problem List   Diagnosis Date Noted  . OSA (obstructive sleep apnea) 10/28/2020  . Healthcare maintenance 10/28/2020  . History of COVID-19 10/28/2020  . Hypothyroidism 06/26/2020  . Hepatic steatosis 06/26/2020  . Hyperlipidemia 06/26/2020  . History of repair of hiatal hernia 03/12/2020  . Hyponatremia 02/20/2020  . Pleural effusion on right 01/11/2020  . Iron deficiency anemia due to chronic blood loss   . Acute gastritis without hemorrhage   . Hematochezia   . Hypertension 10/02/2018  . DOE (dyspnea on exertion) 10/02/2018  . Persistent atrial fibrillation (HCC) 09/21/2018    Past Surgical History:  Procedure Laterality Date  . COLONOSCOPY WITH PROPOFOL N/A 04/17/2019   Procedure: COLONOSCOPY WITH PROPOFOL;  Surgeon: Midge Minium, MD;  Location[facility] ENDOSCOPY;  Service: Endoscopy;  Laterality: N/A;  . ESOPHAGOGASTRODUODENOSCOPY N/A 03/12/2020   Procedure: ESOPHAGOGASTRODUODENOSCOPY (EGD);  Surgeon: Corliss Skains, MD;  Location: [facility] OR;  Service: Thoracic;  Laterality: N/A;  . ESOPHAGOGASTRODUODENOSCOPY (EGD) WITH PROPOFOL  N/A 04/17/2019   Procedure: ESOPHAGOGASTRODUODENOSCOPY (EGD) WITH PROPOFOL;  Surgeon: Midge Minium, MD;  Location: [facility] ENDOSCOPY;  Service: Endoscopy;  Laterality: N/A;  . GIVENS CAPSULE STUDY  05/2019   negative  . VIDEO ASSISTED THORACOSCOPY (VATS)/DECORTICATION Right 03/12/2020   Procedure: VIDEO ASSISTED THORACOSCOPY (VATS)/DRAINING OF AN EMPYEMA;  Surgeon:  Corliss Skains, MD;  Location: [facility] OR;  Service: Thoracic;  Laterality: Right;  . XI ROBOTIC ASSISTED HIATAL HERNIA REPAIR N/A 03/12/2020   Procedure: XI ROBOTIC ASSISTED HIATAL HERNIA REPAIR USING ACELL MATRIX HIATAL;  Surgeon: Corliss Skains, MD;  Location: [facility] OR;  Service: Thoracic;  Laterality: N/A;    Family History  Problem Relation Age of Onset  . Hypertension Mother   . Thyroid disease Mother   . Stroke Father 65  . Liver cancer Maternal Uncle   . Heart disease Brother   . Hypertension Brother     Social History   Tobacco Use  . Smoking status: Never Smoker  . Smokeless tobacco: Never Used  Vaping Use  . Vaping Use: Never used  Substance Use Topics  . Alcohol use: Not Currently    Comment: quit ETOH 03/12/2020  . Drug use: Yes    Types: Marijuana    Comment: gummy, last week     No Known Allergies  Health Maintenance  Topic Date Due  . COVID-19 Vaccine (1) 01/22/2021 (Originally 06/04/1971)  . INFLUENZA VACCINE  03/05/2021 (Originally 07/06/2020)  . TETANUS/TDAP  01/19/2022 (Originally 06/05/1987)  . COLONOSCOPY (Pts 45-56yrs Insurance coverage will need to be confirmed)  04/16/2029  . Hepatitis C Screening  Completed  . HIV Screening  Completed    Chart Review Today: I personally reviewed active problem list, medication list, allergies, family history, social history, health maintenance, notes from last encounter, lab results, imaging with the patient/caregiver today.   Review of Systems  Constitutional: Negative.   HENT: Negative.   Eyes: Negative.   Respiratory: Negative.   Cardiovascular: Negative.   Gastrointestinal: Negative.   Endocrine: Negative.   Genitourinary: Negative.   Musculoskeletal: Negative.   Skin: Negative.   Allergic/Immunologic: Negative.   Neurological: Negative.   Hematological: Negative.   Psychiatric/Behavioral: Negative.   All other systems reviewed and are negative.    Objective:   Vitals:   01/19/21 1123  BP:  126/78  Pulse: 88  Resp: 18  Temp: 98.1 F (36.7 C)  TempSrc: Oral  SpO2: 99%  Weight: 227 lb (103 kg)  Height: 5\' 8"  (1.727 m)    Body mass index is 34.52 kg/m.  Physical Exam Vitals and nursing note reviewed.  Constitutional:      General: He is not in acute distress.    Appearance: Normal appearance. He is well-developed. He is obese. He is not ill-appearing, toxic-appearing or diaphoretic.     Interventions: Face mask in place.  HENT:     Head: Normocephalic and atraumatic.     Jaw: No trismus.     Right Ear: External ear normal.     Left Ear: External ear normal.  Eyes:     General: Lids are normal. No scleral icterus.       Right eye: No discharge.        Left eye: No discharge.     Conjunctiva/sclera: Conjunctivae normal.  Neck:     Trachea: Trachea and phonation normal. No tracheal deviation.  Cardiovascular:     Rate and Rhythm: Normal rate and regular rhythm.     Pulses: Normal pulses.  Radial pulses are 2+ on the right side and 2+ on the left side.       Posterior tibial pulses are 2+ on the right side and 2+ on the left side.     Heart sounds: Normal heart sounds. No murmur heard. No friction rub. No gallop.   Pulmonary:     Effort: Pulmonary effort is normal. No respiratory distress.     Breath sounds: Normal breath sounds. No stridor. No wheezing, rhonchi or rales.  Abdominal:     General: Bowel sounds are normal. There is no distension.     Palpations: Abdomen is soft.  Musculoskeletal:     Right lower leg: No edema.     Left lower leg: No edema.  Skin:    General: Skin is warm and dry.     Coloration: Skin is not jaundiced.     Findings: No rash.     Nails: There is no clubbing.  Neurological:     Mental Status: He is alert. Mental status is at baseline.     Cranial Nerves: No dysarthria or facial asymmetry.     Motor: No tremor or abnormal muscle tone.     Gait: Gait normal.  Psychiatric:        Mood and Affect: Mood normal.         Speech: Speech normal.        Behavior: Behavior normal. Behavior is cooperative.         Assessment & Plan:     ICD-10-CM   1. Essential hypertension  I10 COMPLETE METABOLIC PANEL WITH GFR   Stable, well-controlled, blood pressure at goal today continue same dose of lisinopril, beta-blocker per cardiology  2. Hypothyroidism, unspecified type  E03.9 TSH   No symptoms concerning for chemical hyper or hypothyroid, check labs and adjust dose as needed  3. Hyperlipidemia, unspecified hyperlipidemia type  E78.5 Lipid panel    COMPLETE METABOLIC PANEL WITH GFR   hx of mild hyperlipidemia, currently not on medications, recheck labs, statin per ASCVD/cardiology  4. Screening for HIV without presence of risk factors  Z11.4 HIV antibody (with reflex)   Low risk  5. Hyperglycemia  R73.9 COMPLETE METABOLIC PANEL WITH GFR    Hemoglobin A1c   History of high blood sugar in the past check his A1c to screen for diabetes or prediabetes  6. Iron deficiency anemia, unspecified iron deficiency anemia type  D50.9 CBC with Differential/Platelet   Recheck labs  7. Elevated LFTs  R79.89 COMPLETE METABOLIC PANEL WITH GFR   Recheck labs  8. Persistent atrial fibrillation (HCC)  I48.19 COMPLETE METABOLIC PANEL WITH GFR    CBC with Differential/Platelet   Per cardiology currently rate controlled  9. Barrett's esophagus without dysplasia  K22.70    Per gastroenterology he is managing with omeprazole, history of GI bleed likely long-term PPI use, no current sx, monitor CBC and sx  10. DOE (dyspnea on exertion)  R06.00 DG Chest 2 View   mild, he has f/up with cardiology   11. Encounter for medication monitoring  Z51.81 TSH    Lipid panel    COMPLETE METABOLIC PANEL WITH GFR    CBC with Differential/Platelet     Return in about 6 months (around 07/19/2021) for Routine follow-up.   Danelle Berry, PA-C 01/19/21 11:37 AM

## 2021-01-19 NOTE — Patient Instructions (Signed)
Blood Pressure Record Sheet  Blood pressure log Date: _______________________  a.m. _____________________(1st reading) _____________________(2nd reading)  p.m. _____________________(1st reading) _____________________(2nd reading) Date: _______________________  a.m. _____________________(1st reading) _____________________(2nd reading)  p.m. _____________________(1st reading) _____________________(2nd reading) Date: _______________________  a.m. _____________________(1st reading) _____________________(2nd reading)  p.m. _____________________(1st reading) _____________________(2nd reading) Date: _______________________  a.m. _____________________(1st reading) _____________________(2nd reading)  p.m. _____________________(1st reading) _____________________(2nd reading) Date: _______________________  a.m. _____________________(1st reading) _____________________(2nd reading)  p.m. _____________________(1st reading) _____________________(2nd reading) Date: _______________________  a.m. _____________________(1st reading) _____________________(2nd reading)  p.m. _____________________(1st reading) _____________________(2nd reading) Date: _______________________  a.m. _____________________(1st reading) _____________________(2nd reading)  p.m. _____________________(1st reading) _____________________(2nd reading) Date: _______________________  a.m. _____________________(1st reading) _____________________(2nd reading)  p.m. _____________________(1st reading) _____________________(2nd reading) Date: _______________________  a.m. _____________________(1st reading) _____________________(2nd reading)  p.m. _____________________(1st reading) _____________________(2nd reading) Date: _______________________  a.m. _____________________(1st reading) _____________________(2nd reading)  p.m. _____________________(1st reading) _____________________(2nd reading)       How to Take Your  Blood Pressure Blood pressure measures how strongly your blood is pressing against the walls of your arteries. Arteries are blood vessels that carry blood from your heart throughout your body. You can take your blood pressure at home with a machine. You may need to check your blood pressure at home:  To check if you have high blood pressure (hypertension).  To check your blood pressure over time.  To make sure your blood pressure medicine is working. Supplies needed:  Blood pressure machine, or monitor.  Dining room chair to sit in.  Table or desk.  Small notebook.  Pencil or pen. How to prepare Avoid these things for 30 minutes before checking your blood pressure:  Having drinks with caffeine in them, such as coffee or tea.  Drinking alcohol.  Eating.  Smoking.  Exercising. Do these things five minutes before checking your blood pressure:  Go to the bathroom and pee (urinate).  Sit in a dining chair. Do not sit in a soft couch or an armchair.  Be quiet. Do not talk. How to take your blood pressure Follow the instructions that came with your machine. If you have a digital blood pressure monitor, these may be the instructions: 1. Sit up straight. 2. Place your feet on the floor. Do not cross your ankles or legs. 3. Rest your left arm at the level of your heart. You may rest it on a table, desk, or chair. 4. Pull up your shirt sleeve. 5. Wrap the blood pressure cuff around the upper part of your left arm. The cuff should be 1 inch (2.5 cm) above your elbow. It is best to wrap the cuff around bare skin. 6. Fit the cuff snugly around your arm. You should be able to place only one finger between the cuff and your arm. 7. Place the cord so that it rests in the bend of your elbow. 8. Press the power button. 9. Sit quietly while the cuff fills with air and loses air. 10. Write down the numbers on the screen. 11. Wait 2-3 minutes and then repeat steps 1-10.   What do the  numbers mean? Two numbers make up your blood pressure. The first number is called systolic pressure. The second is called diastolic pressure. An example of a blood pressure reading is "120 over 80" (or 120/80). If you are an adult and do not have a medical condition, use this guide to find out if your blood pressure is normal: Normal  First number: below 120.  Second number: below 80. Elevated  First number: 120-129.  Second   number: below 80. Hypertension stage 1  First number: 130-139.  Second number: 80-89. Hypertension stage 2  First number: 140 or above.  Second number: 90 or above. Your blood pressure is above normal even if only the top or bottom number is above normal. Follow these instructions at home:  Check your blood pressure as often as your doctor tells you to.  Check your blood pressure at the same time every day.  Take your monitor to your next doctor's appointment. Your doctor will: ? Make sure you are using it correctly. ? Make sure it is working right.  Make sure you understand what your blood pressure numbers should be.  Tell your doctor if your medicine is causing side effects.  Keep all follow-up visits as told by your doctor. This is important. General tips:  You will need a blood pressure machine, or monitor. Your doctor can suggest a monitor. You can buy one at a drugstore or online. When choosing one: ? Choose one with an arm cuff. ? Choose one that wraps around your upper arm. Only one finger should fit between your arm and the cuff. ? Do not choose one that measures your blood pressure from your wrist or finger. Where to find more information American Heart Association: www.heart.org Contact a doctor if:  Your blood pressure keeps being high. Get help right away if:  Your first blood pressure number is higher than 180.  Your second blood pressure number is higher than 120. Summary  Check your blood pressure at the same time every  day.  Avoid caffeine, alcohol, smoking, and exercise for 30 minutes before checking your blood pressure.  Make sure you understand what your blood pressure numbers should be. This information is not intended to replace advice given to you by your health care provider. Make sure you discuss any questions you have with your health care provider. Document Revised: 11/16/2019 Document Reviewed: 11/16/2019 Elsevier Patient Education  2021 Elsevier Inc.  

## 2021-01-20 LAB — CBC WITH DIFFERENTIAL/PLATELET
Absolute Monocytes: 752 cells/uL (ref 200–950)
Basophils Absolute: 40 cells/uL (ref 0–200)
Basophils Relative: 0.6 %
Eosinophils Absolute: 284 cells/uL (ref 15–500)
Eosinophils Relative: 4.3 %
HCT: 42.4 % (ref 38.5–50.0)
Hemoglobin: 14.4 g/dL (ref 13.2–17.1)
Lymphs Abs: 997 cells/uL (ref 850–3900)
MCH: 29.9 pg (ref 27.0–33.0)
MCHC: 34 g/dL (ref 32.0–36.0)
MCV: 88 fL (ref 80.0–100.0)
MPV: 10 fL (ref 7.5–12.5)
Monocytes Relative: 11.4 %
Neutro Abs: 4528 cells/uL (ref 1500–7800)
Neutrophils Relative %: 68.6 %
Platelets: 258 10*3/uL (ref 140–400)
RBC: 4.82 10*6/uL (ref 4.20–5.80)
RDW: 12.8 % (ref 11.0–15.0)
Total Lymphocyte: 15.1 %
WBC: 6.6 10*3/uL (ref 3.8–10.8)

## 2021-01-20 LAB — COMPLETE METABOLIC PANEL WITH GFR
AG Ratio: 1.8 (calc) (ref 1.0–2.5)
ALT: 19 U/L (ref 9–46)
AST: 16 U/L (ref 10–35)
Albumin: 4.2 g/dL (ref 3.6–5.1)
Alkaline phosphatase (APISO): 35 U/L (ref 35–144)
BUN: 12 mg/dL (ref 7–25)
CO2: 27 mmol/L (ref 20–32)
Calcium: 9.1 mg/dL (ref 8.6–10.3)
Chloride: 96 mmol/L — ABNORMAL LOW (ref 98–110)
Creat: 1.07 mg/dL (ref 0.70–1.33)
GFR, Est African American: 92 mL/min/{1.73_m2} (ref >=60)
GFR, Est Non African American: 79 mL/min/{1.73_m2} (ref >=60)
Globulin: 2.4 g/dL (calc) (ref 1.9–3.7)
Glucose, Bld: 91 mg/dL (ref 65–99)
Potassium: 4.8 mmol/L (ref 3.5–5.3)
Sodium: 131 mmol/L — ABNORMAL LOW (ref 135–146)
Total Bilirubin: 1.3 mg/dL — ABNORMAL HIGH (ref 0.2–1.2)
Total Protein: 6.6 g/dL (ref 6.1–8.1)

## 2021-01-20 LAB — HIV ANTIBODY (ROUTINE TESTING W REFLEX): HIV 1&2 Ab, 4th Generation: NONREACTIVE

## 2021-01-20 LAB — HEMOGLOBIN A1C
Hgb A1c MFr Bld: 5.5 % of total Hgb (ref <5.7)
Mean Plasma Glucose: 111 mg/dL
eAG (mmol/L): 6.2 mmol/L

## 2021-01-20 LAB — LIPID PANEL
Cholesterol: 183 mg/dL (ref <200)
HDL: 55 mg/dL (ref >=40)
LDL Cholesterol (Calc): 101 mg/dL (calc) — ABNORMAL HIGH
Non-HDL Cholesterol (Calc): 128 mg/dL (calc) (ref <130)
Total CHOL/HDL Ratio: 3.3 (calc) (ref <5.0)
Triglycerides: 175 mg/dL — ABNORMAL HIGH (ref <150)

## 2021-01-20 LAB — TSH: TSH: 3.9 mIU/L (ref 0.40–4.50)

## 2021-01-26 ENCOUNTER — Other Ambulatory Visit: Payer: Self-pay

## 2021-01-26 ENCOUNTER — Encounter: Payer: Self-pay | Admitting: Cardiology

## 2021-01-26 ENCOUNTER — Ambulatory Visit (INDEPENDENT_AMBULATORY_CARE_PROVIDER_SITE_OTHER): Payer: Medicaid Other | Admitting: Cardiology

## 2021-01-26 VITALS — BP 124/94 | HR 81 | Ht 63.0 in | Wt 228.0 lb

## 2021-01-26 DIAGNOSIS — I4819 Other persistent atrial fibrillation: Secondary | ICD-10-CM

## 2021-01-26 DIAGNOSIS — I10 Essential (primary) hypertension: Secondary | ICD-10-CM

## 2021-01-26 MED ORDER — METOPROLOL TARTRATE 100 MG PO TABS: 100.0000 mg | ORAL_TABLET | Freq: Two times a day (BID) | ORAL | 3 refills | Status: DC

## 2021-01-26 MED ORDER — METOPROLOL TARTRATE 50 MG PO TABS: 50.0000 mg | ORAL_TABLET | Freq: Two times a day (BID) | ORAL | 2 refills | Status: DC

## 2021-01-26 NOTE — Progress Notes (Signed)
Cardiology Office Note:    Date:  01/26/2021   ID:  Dean Anderson, DOB 01/24/1966, MRN [id number]  PCP:  Danelle Berry, PA-C  Cardiologist:  Debbe Odea, MD  Electrophysiologist:  None   Referring MD: Danelle Berry, PA-C   Chief Complaint  Patient presents with  . Follow-up    6 Months follow up. Medications verbally reviewed with patient.     History of Present Illness:    Dean Anderson is a 55 y.o. male with a hx of persistent A. fib, hypertension, OSA who presents for follow-up.  Previously seen for A. fib and hypertension.  Blood pressure was elevated, lisinopril started with good effect.    Blood pressures have been better controlled since addition of lisinopril.  Has a CHA2DS2-VASc score of 1, also has GI bleeding/hemorrhoidal bleeding preventing use of anticoagulants.  He feels well, denies palpitations, taking all his medications as prescribed.  Has occasional GI bleeds.  He was recently diagnosed with OSA, CPAP machine has been ordered by pulmonary service.   Prior notes Since diagnosis of A. fib around 2019, he was  put on blood thinners but subsequently developed an anemia.  GI work-up has since been unrevealing.  He states noticing blood in his stool sometimes.   History of dyspnea status post VATS draining of empyema and decortication, hiatal hernia repair.  Shortness of breath improved after surgery. Echo 04/2020 showed normal systolic function, EF 60 to 65%, moderate LA dilatation, aortic valve sclerosis..  Past Medical History:  Diagnosis Date  . Anemia   . Atrial fibrillation (HCC)   . Dyspnea   . Family history of adverse reaction to anesthesia    brother woke up in a rage after geting anesthesia for teeth surgery  . GERD (gastroesophageal reflux disease)   . History of hiatal hernia   . Hypertension   . Hypothyroidism   . Thyroid disease     Past Surgical History:  Procedure Laterality Date  . COLONOSCOPY WITH PROPOFOL N/A 04/17/2019   Procedure:  COLONOSCOPY WITH PROPOFOL;  Surgeon: Midge Minium, MD;  Location: [facility] ENDOSCOPY;  Service: Endoscopy;  Laterality: N/A;  . ESOPHAGOGASTRODUODENOSCOPY N/A 03/12/2020   Procedure: ESOPHAGOGASTRODUODENOSCOPY (EGD);  Surgeon: Corliss Skains, MD;  Location: [facility] OR;  Service: Thoracic;  Laterality: N/A;  . ESOPHAGOGASTRODUODENOSCOPY (EGD) WITH PROPOFOL N/A 04/17/2019   Procedure: ESOPHAGOGASTRODUODENOSCOPY (EGD) WITH PROPOFOL;  Surgeon: Midge Minium, MD;  Location: [facility] ENDOSCOPY;  Service: Endoscopy;  Laterality: N/A;  . GIVENS CAPSULE STUDY  05/2019   negative  . VIDEO ASSISTED THORACOSCOPY (VATS)/DECORTICATION Right 03/12/2020   Procedure: VIDEO ASSISTED THORACOSCOPY (VATS)/DRAINING OF AN EMPYEMA;  Surgeon: Corliss Skains, MD;  Location: [facility] OR;  Service: Thoracic;  Laterality: Right;  . XI ROBOTIC ASSISTED HIATAL HERNIA REPAIR N/A 03/12/2020   Procedure: XI ROBOTIC ASSISTED HIATAL HERNIA REPAIR USING ACELL MATRIX HIATAL;  Surgeon: Corliss Skains, MD;  Location: [facility] OR;  Service: Thoracic;  Laterality: N/A;    Current Medications: Current Meds  Medication Sig  . EUTHYROX 88 MCG tablet TAKE 1 TABLET BY MOUTH ONCE DAILY BEFORE BREAKFAST  . fluticasone (FLONASE) 50 MCG/ACT nasal spray Place 2 sprays into both nostrils daily as needed for allergies or rhinitis.  . hydrochlorothiazide (HYDRODIURIL) 25 MG tablet Take 1 tablet (25 mg total) by mouth daily.  Marland Kitchen lisinopril (ZESTRIL) 40 MG tablet Take 1 tablet (40 mg total) by mouth daily.  . metoprolol tartrate (LOPRESSOR) 100 MG tablet Take 1 tablet (100 mg total) by mouth 2 (two) times  daily.  . omeprazole (PRILOSEC) 20 MG capsule Take 1 capsule (20 mg total) by mouth at bedtime.  . sildenafil (REVATIO) 20 MG tablet Take 5 tablets (100 mg total) by mouth daily as needed (30 min prior to sexual activity).  . [DISCONTINUED] metoprolol tartrate (LOPRESSOR) 100 MG tablet Take 0.5 tablets (50 mg total) by mouth 2 (two) times daily.     Allergies:    Patient has no known allergies.   Social History   Socioeconomic History  . Marital status: Married    Spouse name: [place]  . Number of children: Not on file  . Years of education: 71  . Highest education level: Some college, no degree  Occupational History  . Not on file  Tobacco Use  . Smoking status: Never Smoker  . Smokeless tobacco: Never Used  Vaping Use  . Vaping Use: Never used  Substance and Sexual Activity  . Alcohol use: Not Currently    Comment: quit ETOH 03/12/2020  . Drug use: Yes    Types: Marijuana    Comment: gummy, last week  . Sexual activity: Yes  Other Topics Concern  . Not on file  Social History Narrative   alcohol use- cut back April 2021.  Lives with his wife at home.   No lung dx hx, grew up with second hand smoke, wife smokes out of home   Social Determinants of Health   Financial Resource Strain: Not on file  Food Insecurity: Not on file  Transportation Needs: Not on file  Physical Activity: Not on file  Stress: Not on file  Social Connections: Not on file     Family History: The patient's family history includes Heart disease in his brother; Hypertension in his brother and mother; Liver cancer in his maternal uncle; Stroke (age of onset: 41) in his father; Thyroid disease in his mother.  ROS:   Please see the history of present illness.     All other systems reviewed and are negative.  EKGs/Labs/Other Studies Reviewed:    The following studies were reviewed today:   EKG:  EKG is  ordered today.  EKG shows atrial fibrillation, heart rate 81  Recent Labs: 01/19/2021: ALT 19; BUN 12; Creat 1.07; Hemoglobin 14.4; Platelets 258; Potassium 4.8; Sodium 131; TSH 3.90  Recent Lipid Panel    Component Value Date/Time   CHOL 183 01/19/2021 1205   CHOL 209 (H) 05/21/2020 1649   TRIG 175 (H) 01/19/2021 1205   TRIG 240 (H) 05/21/2020 1649   HDL 55 01/19/2021 1205   CHOLHDL 3.3 01/19/2021 1205   LDLCALC 101 (H) 01/19/2021 1205     Physical Exam:    VS:  BP (!) 124/94 (BP Location: Left Arm, Patient Position: Sitting, Cuff Size: Normal)   Pulse 81   Ht 5\' 3"  (1.6 m)   Wt 228 lb (103.4 kg)   SpO2 93%   BMI 40.39 kg/m     Wt Readings from Last 3 Encounters:  01/26/21 228 lb (103.4 kg)  01/19/21 227 lb (103 kg)  01/06/21 227 lb (103 kg)     GEN:  Well nourished, well developed in no acute distress HEENT: Normal NECK: No JVD; No carotid bruits LYMPHATICS: No lymphadenopathy CARDIAC: Irregular irregular, no murmurs, rubs, gallops RESPIRATORY: Decreased breath sounds at the right lung base, clear anteriorly ABDOMEN: Soft, non-tender, non-distended MUSCULOSKELETAL:  No edema; No deformity  SKIN: Warm and dry NEUROLOGIC:  Alert and oriented x 3 PSYCHIATRIC:  Normal affect   ASSESSMENT:  1. Persistent atrial fibrillation (HCC)   2. Essential hypertension    PLAN:    In order of problems listed above:  1. Patient with persistent atrial fibrillation.  CHA2DS2-VASc: 1(htn).  Intolerable to anticoagulants with severe anemia.  GI work-up is so far has shown hemorrhoidal bleeding are only possible etiology/.  Continue Lopressor to 100 mg twice daily.  Okay not to anticoagulate due to CHA2DS2-VASc of 1.  If CHA2DS2-VASc score becomes elevated/greater than/= 2, will consider watchman.  Watchman procedure discussed again with patient 2. history of hypertension, BP controlled, continue Lopressor, lisinopril 40 mg daily,  HCTZ 25 mg daily.    Follow-up in 6 months  Total encounter time minutes  Greater than 50% was spent in counseling and coordination of care with the patient Medication management.  This note was generated in part or whole with voice recognition software. Voice recognition is usually quite accurate but there are transcription errors that can and very often do occur. I apologize for any typographical errors that were not detected and corrected.  Medication Adjustments/Labs and Tests  Ordered: Current medicines are reviewed at length with the patient today.  Concerns regarding medicines are outlined above.  Orders Placed This Encounter  Procedures  . EKG 12-Lead   Meds ordered this encounter  Medications  . DISCONTD: metoprolol tartrate (LOPRESSOR) 50 MG tablet    Sig: Take 1 tablet (50 mg total) by mouth 2 (two) times daily.    Dispense:  180 tablet    Refill:  2    Persistent afib  . metoprolol tartrate (LOPRESSOR) 100 MG tablet    Sig: Take 1 tablet (100 mg total) by mouth 2 (two) times daily.    Dispense:  180 tablet    Refill:  3    Please cancel previous Rx for meto 50 mg BID. Correct one is 100 mg BID. Thanks    Patient Instructions  Medication Instructions:  Your physician recommends that you continue on your current medications as directed. Please refer to the Current Medication list given to you today.  Ok to continue taking Metoprolol tartrate 100 mg by mouth two times a day as you were doing per Dr. Azucena Cecil.  *If you need a refill on your cardiac medications before your next appointment, please call your pharmacy*  Follow-Up: At [facility], you and your health needs are our priority.  As part of our continuing mission to provide you with exceptional heart care, we have created designated Provider Care Teams.  These Care Teams include your primary Cardiologist (physician) and Advanced Practice Providers (APPs -  Physician Assistants and Nurse Practitioners) who all work together to provide you with the care you need, when you need it.  We recommend signing up for the patient portal called "MyChart".  Sign up information is provided on this After Visit Summary.  MyChart is used to connect with patients for Virtual Visits (Telemedicine).  Patients are able to view lab/test results, encounter notes, upcoming appointments, etc.  Non-urgent messages can be sent to your provider as well.   To learn more about what you can do with MyChart, go to  [email or web address].    Your next appointment:   6 month(s)  The format for your next appointment:   In Person  Provider:   You may see Debbe Odea, MD or one of the following Advanced Practice Providers on your designated Care Team:    Nicolasa Ducking, NP  Eula Listen, PA-C  Marisue Ivan, PA-C  Cadence Fransico Michael,  PA-C  Gillian Shieldsaitlin Walker, NP      Signed, Debbe OdeaBrian Agbor-Etang, MD  01/26/2021 12:12 PM    [facility]

## 2021-01-26 NOTE — Patient Instructions (Addendum)
Medication Instructions:  Your physician recommends that you continue on your current medications as directed. Please refer to the Current Medication list given to you today.  Ok to continue taking Metoprolol tartrate 100 mg by mouth two times a day as you were doing per Dr. Azucena Cecil.  *If you need a refill on your cardiac medications before your next appointment, please call your pharmacy*  Follow-Up: At [facility], you and your health needs are our priority.  As part of our continuing mission to provide you with exceptional heart care, we have created designated Provider Care Teams.  These Care Teams include your primary Cardiologist (physician) and Advanced Practice Providers (APPs -  Physician Assistants and Nurse Practitioners) who all work together to provide you with the care you need, when you need it.  We recommend signing up for the patient portal called "MyChart".  Sign up information is provided on this After Visit Summary.  MyChart is used to connect with patients for Virtual Visits (Telemedicine).  Patients are able to view lab/test results, encounter notes, upcoming appointments, etc.  Non-urgent messages can be sent to your provider as well.   To learn more about what you can do with MyChart, go to [email or web address].    Your next appointment:   6 month(s)  The format for your next appointment:   In Person  Provider:   You may see Debbe Odea, MD or one of the following Advanced Practice Providers on your designated Care Team:    Nicolasa Ducking, NP  Eula Listen, PA-C  Marisue Ivan, PA-C  Cadence [place], [place]  Gillian Shields, NP

## 2021-01-28 ENCOUNTER — Encounter: Payer: Self-pay | Admitting: Gastroenterology

## 2021-01-28 ENCOUNTER — Other Ambulatory Visit: Payer: Self-pay

## 2021-01-28 ENCOUNTER — Ambulatory Visit (INDEPENDENT_AMBULATORY_CARE_PROVIDER_SITE_OTHER): Payer: Medicaid Other | Admitting: Gastroenterology

## 2021-01-28 VITALS — BP 123/79 | HR 77 | Temp 97.8°F | Ht 69.0 in | Wt 230.0 lb

## 2021-01-28 DIAGNOSIS — R748 Abnormal levels of other serum enzymes: Secondary | ICD-10-CM | POA: Diagnosis not present

## 2021-01-28 DIAGNOSIS — K2270 Barrett's esophagus without dysplasia: Secondary | ICD-10-CM

## 2021-01-28 DIAGNOSIS — K760 Fatty (change of) liver, not elsewhere classified: Secondary | ICD-10-CM

## 2021-01-29 NOTE — Progress Notes (Signed)
Dean Bouillon, MD [address]  [address]  [place], [place] [address]  Main: [phone]  Fax: [phone]   Primary Care Physician: Danelle Berry, PA-C   Chief Complaint  Patient presents with  . Elevated Hepatic Enzymes    HPI: Dean Anderson is a 55 y.o. male with history of Barrett's esophagus, 6 cm long segment, previous history of H. pylori, indirect hyperbilirubinemia from Dean Anderson here for follow-up. The patient denies abdominal or flank pain, anorexia, nausea or vomiting, dysphagia, change in bowel habits or black or bloody stools or weight loss.  Denies any episodes of confusion, abdominal distention or lower extremity edema.  Previous history: So far his work-up has shown ultrasound reporting hepatic steatosis, FibroSure level not consistent with cirrhosis.  Normal platelets, albumin not consistent with cirrhosis either.  History of pleural effusions in 2021 requiring thoracentesis, and VATS procedure, along with hiatal hernia repair, previous history of alcohol abuse-abstinent since March or April 2021, elevated liver enzymes  Also has history of H. pylori that was treated, Barrett's esophagus, hiatal hernia,   previously seen in April 2020 due to iron deficiency anemia anemia and intermittent bright red blood per rectum on Eliquis.  May 2020 Upper endoscopy , Dr. Servando Snare 6 cm long segment Barrett's, biopsied every 2 cm Gastric erythema Hiatal hernia  Colonoscopy, Dr. Servando Snare, with nonbleeding internal hemorrhoids, otherwise normal  Pathology showed H. 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 Leone Payor with no etiology of iron deficiency anemia on small bowel exam  CT scan done by hematology oncology do not reveal any sources of iron deficiency anemia except for a large hiatal hernia for which I did look use has been placed with some water when my I was just postpartum like a lot of like foods  and my mom he was referred to surgery  He also had mildly elevated liver enzymes and reported daily alcohol use.  He was advised to abstain from alcohol use.  Further viral and autoimmune hepatitis work-up was negative.  Right upper quadrant ultrasound indicated hepatic steatosis with no focal liver lesions   Current Outpatient Medications  Medication Sig Dispense Refill  . EUTHYROX 88 MCG tablet TAKE 1 TABLET BY MOUTH ONCE DAILY BEFORE BREAKFAST 90 tablet 1  . fluticasone (FLONASE) 50 MCG/ACT nasal spray Place 2 sprays into both nostrils daily as needed for allergies or rhinitis. 16 g 5  . hydrochlorothiazide (HYDRODIURIL) 25 MG tablet Take 1 tablet (25 mg total) by mouth daily. 90 tablet 3  . lisinopril (ZESTRIL) 40 MG tablet Take 1 tablet (40 mg total) by mouth daily. 30 tablet 5  . metoprolol tartrate (LOPRESSOR) 100 MG tablet Take 1 tablet (100 mg total) by mouth 2 (two) times daily. 180 tablet 3  . omeprazole (PRILOSEC) 20 MG capsule Take 1 capsule (20 mg total) by mouth at bedtime. 90 capsule 3  . sildenafil (REVATIO) 20 MG tablet Take 5 tablets (100 mg total) by mouth daily as needed (30 min prior to sexual activity). 120 tablet 1   No current facility-administered medications for this visit.    Allergies as of 01/28/2021  . (No Known Allergies)    ROS:  General: Negative for anorexia, weight loss, fever, chills, fatigue, weakness. ENT: Negative for hoarseness, difficulty swallowing , nasal congestion. CV: Negative for chest pain, angina, palpitations, dyspnea on exertion, peripheral edema.  Respiratory: Negative for dyspnea at rest, dyspnea on exertion, cough, sputum, wheezing.  GI: See history of  present illness. GU:  Negative for dysuria, hematuria, urinary incontinence, urinary frequency, nocturnal urination.  Endo: Negative for unusual weight change.    Physical Examination:   BP 123/79   Pulse 77   Temp 97.8 F (36.6 C) (Oral)   Ht 5\' 9"  (1.753 m)   Wt 230 lb  (104.3 kg)   BMI 33.97 kg/m   General: Well-nourished, well-developed in no acute distress.  Eyes: No icterus. Conjunctivae pink. Mouth: Oropharyngeal mucosa moist and pink , no lesions erythema or exudate. Neck: Supple, Trachea midline Abdomen: Bowel sounds are normal, nontender, nondistended, no hepatosplenomegaly or masses, no abdominal bruits or hernia , no rebound or guarding.   Extremities: No lower extremity edema. No clubbing or deformities. Neuro: Alert and oriented x 3.  Grossly intact. Skin: Warm and dry, no jaundice.   Psych: Alert and cooperative, normal mood and affect.   Labs: CMP     Component Value Date/Time   NA 131 (L) 01/19/2021 1205   NA 138 04/13/2019 1156   K 4.8 01/19/2021 1205   CL 96 (L) 01/19/2021 1205   CO2 27 01/19/2021 1205   GLUCOSE 91 01/19/2021 1205   BUN 12 01/19/2021 1205   BUN 8 04/13/2019 1156   CREATININE 1.07 01/19/2021 1205   CALCIUM 9.1 01/19/2021 1205   PROT 6.6 01/19/2021 1205   PROT 6.7 06/13/2019 1337   ALBUMIN 3.2 (L) 03/14/2020 0550   ALBUMIN 4.0 06/13/2019 1337   AST 16 01/19/2021 1205   ALT 19 01/19/2021 1205   ALKPHOS 31 (L) 03/14/2020 0550   BILITOT 1.3 (H) 01/19/2021 1205   BILITOT 1.2 07/23/2020 1541   GFRNONAA 79 01/19/2021 1205   GFRAA 92 01/19/2021 1205   Lab Results  Component Value Date   WBC 6.6 01/19/2021   HGB 14.4 01/19/2021   HCT 42.4 01/19/2021   MCV 88.0 01/19/2021   PLT 258 01/19/2021    Imaging Studies: No results found.  Assessment and Plan:   Dean Anderson is a 55 y.o. y/o male with history of Barrett's esophagus, history of H. pylori previously treated here for follow-up  Patient has not had any clinical symptoms of cirrhosis Normal albumin Normal platelets No ascites or lower extremity edema  Question if his previous pleural effusions were due to his large hiatal hernia or other etiologies  None of this has reoccurred since his surgery  I will obtain right upper quadrant ultrasound  at this time  Labs are otherwise up-to-date from February 2022  Barrett's surveillance due next year  Finding of fatty liver on imaging discussed with patient Diet, weight loss, and exercise encouraged along with avoiding hepatotoxic drugs including alcohol Risk of progression to cirrhosis if above measures are not instituted were discussed as well, and patient verbalized understanding  Patient is taking low-dose PPI every day due to his history of Barrett's esophagus  (Risks of PPI use were discussed with patient including bone loss, C. Diff diarrhea, pneumonia, infections, CKD, electrolyte abnormalities.  Pt. Verbalizes understanding and chooses to continue the medication.)  Patient educated extensively on acid reflux lifestyle modification, including buying a bed wedge, not eating 3 hrs before bedtime, diet modifications, and handout given for the same.     Dr March 2022

## 2021-02-04 ENCOUNTER — Other Ambulatory Visit: Payer: Self-pay

## 2021-02-04 ENCOUNTER — Ambulatory Visit
Admission: RE | Admit: 2021-02-04 | Discharge: 2021-02-04 | Disposition: A | Discharge status: Home / Self Care | Payer: Medicaid Other | Source: Ambulatory Visit | Attending: Gastroenterology | Admitting: Gastroenterology

## 2021-02-04 DIAGNOSIS — R748 Abnormal levels of other serum enzymes: Secondary | ICD-10-CM | POA: Diagnosis not present

## 2021-02-04 DIAGNOSIS — R945 Abnormal results of liver function studies: Secondary | ICD-10-CM | POA: Diagnosis not present

## 2021-02-05 ENCOUNTER — Other Ambulatory Visit: Payer: Self-pay | Admitting: Family Medicine

## 2021-02-05 MED ORDER — OLMESARTAN MEDOXOMIL-HCTZ 20-12.5 MG PO TABS: 1.0000 | ORAL_TABLET | Freq: Every day | ORAL | 0 refills | Status: DC

## 2021-02-16 ENCOUNTER — Encounter: Payer: Self-pay | Admitting: Family Medicine

## 2021-04-30 ENCOUNTER — Other Ambulatory Visit: Payer: Self-pay | Admitting: Family Medicine

## 2021-04-30 DIAGNOSIS — K449 Diaphragmatic hernia without obstruction or gangrene: Secondary | ICD-10-CM

## 2021-04-30 DIAGNOSIS — K2900 Acute gastritis without bleeding: Secondary | ICD-10-CM

## 2021-04-30 MED ORDER — OMEPRAZOLE 20 MG PO CPDR: 20.0000 mg | DELAYED_RELEASE_CAPSULE | Freq: Every day | ORAL | 3 refills | Status: DC

## 2021-04-30 MED ORDER — OLMESARTAN MEDOXOMIL-HCTZ 20-12.5 MG PO TABS: 1.0000 | ORAL_TABLET | Freq: Every day | ORAL | 0 refills | Status: DC

## 2021-04-30 MED ORDER — METOPROLOL TARTRATE 100 MG PO TABS: 100.0000 mg | ORAL_TABLET | Freq: Two times a day (BID) | ORAL | 3 refills | Status: DC

## 2021-04-30 NOTE — Telephone Encounter (Signed)
Requested medication (s) are due for refill today: yes  Requested medication (s) are on the active medication list: yes  Last refill:  02/05/21  Future visit scheduled: yes  Notes to clinic: no BP follow up noted per pharmacy note dated 02/05/21    Requested Prescriptions  Pending Prescriptions Disp Refills   olmesartan-hydrochlorothiazide (BENICAR HCT) 20-12.5 MG tablet 90 tablet 0    Sig: Take 1 tablet by mouth daily.      Cardiovascular: ARB + Diuretic Combos Failed - 04/30/2021  1:51 PM      Failed - Na in normal range and within 180 days    Sodium  Date Value Ref Range Status  01/19/2021 131 (L) 135 - 146 mmol/L Final  04/13/2019 138 134 - 144 mmol/L Final          Passed - K in normal range and within 180 days    Potassium  Date Value Ref Range Status  01/19/2021 4.8 3.5 - 5.3 mmol/L Final          Passed - Cr in normal range and within 180 days    Creat  Date Value Ref Range Status  01/19/2021 1.07 0.70 - 1.33 mg/dL Final    Comment:    For patients >66 years of age, the reference limit for Creatinine is approximately 13% higher for people identified as African-American. .           Passed - Ca in normal range and within 180 days    Calcium  Date Value Ref Range Status  01/19/2021 9.1 8.6 - 10.3 mg/dL Final   Calcium, Ion  Date Value Ref Range Status  03/13/2020 1.03 (L) 1.15 - 1.40 mmol/L Final          Passed - Patient is not pregnant      Passed - Last BP in normal range    BP Readings from Last 1 Encounters:  01/28/21 123/79          Passed - Valid encounter within last 6 months    Recent Outpatient Visits           3 months ago Essential hypertension   [facility] [place], Sheliah Mends, PA-C   8 months ago Drug-induced erectile dysfunction   [facility] [place], Sheliah Mends, PA-C   10 months ago Persistent atrial fibrillation [facility])   [facility] Danelle Berry, PA-C   1 year ago Essential  hypertension   [facility] Danelle Berry, PA-C   1 year ago Essential hypertension   [facility] Danelle Berry, PA-C       Future Appointments             In 2 months Danelle Berry, PA-C [facility]   In 3 months Agbor-Etang, Arlys John, MD [facility], [facility]   In 3 months Pasty Spillers, MD [facility] [place]               metoprolol tartrate (LOPRESSOR) 100 MG tablet 180 tablet 3    Sig: Take 1 tablet (100 mg total) by mouth 2 (two) times daily.      Cardiovascular:  Beta Blockers Passed - 04/30/2021  1:51 PM      Passed - Last BP in normal range    BP Readings from Last 1 Encounters:  01/28/21 123/79          Passed - Last Heart Rate in normal range    Pulse  Readings from Last 1 Encounters:  01/28/21 77          Passed - Valid encounter within last 6 months    Recent Outpatient Visits           3 months ago Essential hypertension   [facility] [place], Sheliah Mends, PA-C   8 months ago Drug-induced erectile dysfunction   [facility] Danelle Berry, PA-C   10 months ago Persistent atrial fibrillation [facility])   [facility] Danelle Berry, PA-C   1 year ago Essential hypertension   [facility] Danelle Berry, PA-C   1 year ago Essential hypertension   [facility] Danelle Berry, PA-C       Future Appointments             In 2 months Danelle Berry, PA-C [facility]   In 3 months Agbor-Etang, Arlys John, MD [facility], [facility]   In 3 months Pasty Spillers, MD [facility] [place]               omeprazole (PRILOSEC) 20 MG capsule 90 capsule 3    Sig: Take 1 capsule (20 mg total) by mouth at bedtime.      Gastroenterology: Proton Pump Inhibitors Passed - 04/30/2021  1:51 PM      Passed - Valid encounter within last 12 months    Recent  Outpatient Visits           3 months ago Essential hypertension   [facility] Danelle Berry, PA-C   8 months ago Drug-induced erectile dysfunction   [facility] Danelle Berry, PA-C   10 months ago Persistent atrial fibrillation [facility])   [facility] Danelle Berry, PA-C   1 year ago Essential hypertension   [facility] Danelle Berry, PA-C   1 year ago Essential hypertension   [facility] Danelle Berry, PA-C       Future Appointments             In 2 months Danelle Berry, PA-C [facility], [facility]   In 3 months Agbor-Etang, Arlys John, MD [facility], [facility]   In 3 months Pasty Spillers, MD [facility] [place]

## 2021-04-30 NOTE — Telephone Encounter (Signed)
Pt has an appt on 07/20/21

## 2021-04-30 NOTE — Telephone Encounter (Signed)
Copied from CRM [id number]. Topic: Quick Communication - Rx Refill/Question >> Apr 30, 2021 12:34 PM Jaquita Rector A wrote: Medication: olmesartan-hydrochlorothiazide (BENICAR HCT) 20-12.5 MG tablet , metoprolol tartrate (LOPRESSOR) 100 MG tablet, omeprazole (PRILOSEC) 20 MG capsule  Has the patient contacted their pharmacy? Yes.   (Agent: If no, request that the patient contact the pharmacy for the refill.) (Agent: If yes, when and what did the pharmacy advise?)  Preferred Pharmacy (with phone number or street name): [facility] [address] [place]), [place] - [address]  Phone:  [phone] Fax:  [phone]     Agent: Please be advised that RX refills may take up to 3 business days. We ask that you follow-up with your pharmacy.

## 2021-05-06 ENCOUNTER — Other Ambulatory Visit: Payer: Self-pay | Admitting: Family Medicine

## 2021-05-12 ENCOUNTER — Other Ambulatory Visit: Payer: Self-pay

## 2021-05-12 MED ORDER — OLMESARTAN MEDOXOMIL-HCTZ 20-12.5 MG PO TABS: 1.0000 | ORAL_TABLET | Freq: Every day | ORAL | 0 refills | Status: DC

## 2021-06-09 ENCOUNTER — Encounter: Payer: Self-pay | Admitting: Family Medicine

## 2021-07-20 ENCOUNTER — Encounter: Payer: Self-pay | Admitting: Family Medicine

## 2021-07-20 ENCOUNTER — Ambulatory Visit
Admission: RE | Admit: 2021-07-20 | Discharge: 2021-07-20 | Disposition: A | Discharge status: Home / Self Care | Payer: Medicaid Other | Attending: Family Medicine | Admitting: Family Medicine

## 2021-07-20 ENCOUNTER — Other Ambulatory Visit: Payer: Self-pay

## 2021-07-20 ENCOUNTER — Ambulatory Visit
Admission: RE | Admit: 2021-07-20 | Discharge: 2021-07-20 | Disposition: A | Discharge status: Home / Self Care | Payer: Medicaid Other | Source: Ambulatory Visit | Attending: Family Medicine | Admitting: Family Medicine

## 2021-07-20 ENCOUNTER — Ambulatory Visit: Payer: Medicaid Other | Admitting: Family Medicine

## 2021-07-20 VITALS — BP 124/78 | HR 75 | Temp 97.5°F | Resp 18 | Ht 69.0 in | Wt 236.6 lb

## 2021-07-20 DIAGNOSIS — D509 Iron deficiency anemia, unspecified: Secondary | ICD-10-CM | POA: Diagnosis not present

## 2021-07-20 DIAGNOSIS — G4733 Obstructive sleep apnea (adult) (pediatric): Secondary | ICD-10-CM | POA: Diagnosis not present

## 2021-07-20 DIAGNOSIS — I4819 Other persistent atrial fibrillation: Secondary | ICD-10-CM

## 2021-07-20 DIAGNOSIS — I10 Essential (primary) hypertension: Secondary | ICD-10-CM

## 2021-07-20 DIAGNOSIS — J302 Other seasonal allergic rhinitis: Secondary | ICD-10-CM

## 2021-07-20 DIAGNOSIS — K2270 Barrett's esophagus without dysplasia: Secondary | ICD-10-CM

## 2021-07-20 DIAGNOSIS — R0600 Dyspnea, unspecified: Secondary | ICD-10-CM | POA: Insufficient documentation

## 2021-07-20 DIAGNOSIS — J9811 Atelectasis: Secondary | ICD-10-CM | POA: Diagnosis not present

## 2021-07-20 DIAGNOSIS — Z5181 Encounter for therapeutic drug level monitoring: Secondary | ICD-10-CM | POA: Diagnosis not present

## 2021-07-20 DIAGNOSIS — E039 Hypothyroidism, unspecified: Secondary | ICD-10-CM

## 2021-07-20 DIAGNOSIS — R7989 Other specified abnormal findings of blood chemistry: Secondary | ICD-10-CM

## 2021-07-20 DIAGNOSIS — N522 Drug-induced erectile dysfunction: Secondary | ICD-10-CM | POA: Diagnosis not present

## 2021-07-20 DIAGNOSIS — E785 Hyperlipidemia, unspecified: Secondary | ICD-10-CM

## 2021-07-20 DIAGNOSIS — R0609 Other forms of dyspnea: Secondary | ICD-10-CM

## 2021-07-20 MED ORDER — OLMESARTAN MEDOXOMIL-HCTZ 20-12.5 MG PO TABS: 1.0000 | ORAL_TABLET | Freq: Every day | ORAL | 3 refills | Status: DC

## 2021-07-20 MED ORDER — SILDENAFIL CITRATE 100 MG PO TABS: 100.0000 mg | ORAL_TABLET | Freq: Every day | ORAL | 11 refills | Status: DC | PRN

## 2021-07-20 MED ORDER — METOPROLOL TARTRATE 100 MG PO TABS: 100.0000 mg | ORAL_TABLET | Freq: Two times a day (BID) | ORAL | 3 refills | Status: DC

## 2021-07-20 MED ORDER — FLUTICASONE PROPIONATE 50 MCG/ACT NA SUSP: 2.0000 | Freq: Every day | NASAL | 5 refills | Status: DC | PRN

## 2021-07-20 NOTE — Progress Notes (Signed)
Name: Dean CamelKelvin Katona   MRN: [id number]    DOB: 1966-09-16   Date:07/20/2021       Progress Note  Chief Complaint  Patient presents with   Hypertension   Gastroesophageal Reflux    6 month follow up     Subjective:   Dean Anderson is a 55 y.o. male, presents to clinic for routine f/up  Chronic Afib- per cardiology Dr. Azucena CecilAgbor-Etang  He is having DOE recently, feeling heavy and right in his chest sx similar to when he had pleural effusion - no cough, wheeze, CP, palpitations, near syncope Some LE edema Rate controlled on metoprolol 100 mg BID Pulse Readings from Last 3 Encounters:  07/20/21 75  01/28/21 77  01/26/21 81  HTN on benicar 20-12.5 BP Readings from Last 3 Encounters:  07/20/21 124/78  01/28/21 123/79  01/26/21 (!) 124/94   No longer on lasix   Hypothyroid was on 88 mcg daily levothyroxine He hasn't taken the med in months Last recheck was done in office by covering provider and colleague Lab Results  Component Value Date   TSH 3.90 01/19/2021  He reports gaining weight, denies swelling, fatigue, hair/skin changes or poor mood  Obesity:  up 10 lbs since last OV Wt Readings from Last 5 Encounters:  07/20/21 236 lb 9.6 oz (107.3 kg)  01/28/21 230 lb (104.3 kg)  01/26/21 228 lb (103.4 kg)  01/19/21 227 lb (103 kg)  01/06/21 227 lb (103 kg)   BMI Readings from Last 5 Encounters:  07/20/21 34.94 kg/m  01/28/21 33.97 kg/m  01/26/21 40.39 kg/m  01/19/21 34.52 kg/m  01/06/21 40.21 kg/m   Barretts esophagus, elevated LFTs, prior GI blood loss on anticoagulants w/ neg work up by oncology and GI in 2020 - pt not on anticoagulants now with afib - saw Dr. Servando SnareWohl in the past and more recently Dr. Maximino Greenlandahiliani On PPI daily, asx Prior elevated LFT's improved Lab Results  Component Value Date   ALT 19 01/19/2021   AST 16 01/19/2021   ALKPHOS 31 (L) 03/14/2020   BILITOT 1.3 (H) 01/19/2021  Repeated RUQ US was unremarkable Hx of gilberts  OSA not able to get  CPAP - lost to f/up with pulm  ED - sildenafil 100 mg works for him using about 3 x a week   Allergies - ran out of flonase, got an otc antihistamine instead           Current Outpatient Medications:    EUTHYROX 88 MCG tablet, TAKE 1 TABLET BY MOUTH ONCE DAILY BEFORE BREAKFAST, Disp: 90 tablet, Rfl: 1   fluticasone (FLONASE) 50 MCG/ACT nasal spray, Place 2 sprays into both nostrils daily as needed for allergies or rhinitis., Disp: 16 g, Rfl: 5   metoprolol tartrate (LOPRESSOR) 100 MG tablet, Take 1 tablet (100 mg total) by mouth 2 (two) times daily., Disp: 180 tablet, Rfl: 3   olmesartan-hydrochlorothiazide (BENICAR HCT) 20-12.5 MG tablet, Take 1 tablet by mouth daily., Disp: 90 tablet, Rfl: 0   omeprazole (PRILOSEC) 20 MG capsule, Take 1 capsule (20 mg total) by mouth at bedtime., Disp: 90 capsule, Rfl: 3   sildenafil (REVATIO) 20 MG tablet, Take 5 tablets (100 mg total) by mouth daily as needed (30 min prior to sexual activity)., Disp: 120 tablet, Rfl: 1  Patient Active Problem List   Diagnosis Date Noted   OSA (obstructive sleep apnea) 10/28/2020   History of COVID-19 10/28/2020   Hypothyroidism 06/26/2020   Hepatic steatosis 06/26/2020   Hyperlipidemia 06/26/2020  History of repair of hiatal hernia 03/12/2020   Hyponatremia 02/20/2020   Iron deficiency anemia    Barrett's esophagus without dysplasia    Essential hypertension 10/02/2018   DOE (dyspnea on exertion) 10/02/2018   Persistent atrial fibrillation (HCC) 09/21/2018    Past Surgical History:  Procedure Laterality Date   COLONOSCOPY WITH PROPOFOL N/A 04/17/2019   Procedure: COLONOSCOPY WITH PROPOFOL;  Surgeon: Midge Minium, MD;  Location: [facility] ENDOSCOPY;  Service: Endoscopy;  Laterality: N/A;   ESOPHAGOGASTRODUODENOSCOPY N/A 03/12/2020   Procedure: ESOPHAGOGASTRODUODENOSCOPY (EGD);  Surgeon: Corliss Skains, MD;  Location[facility] OR;  Service: Thoracic;  Laterality: N/A;   ESOPHAGOGASTRODUODENOSCOPY (EGD) WITH  PROPOFOL N/A 04/17/2019   Procedure: ESOPHAGOGASTRODUODENOSCOPY (EGD) WITH PROPOFOL;  Surgeon: Midge Minium, MD;  Location: [facility] ENDOSCOPY;  Service: Endoscopy;  Laterality: N/A;   GIVENS CAPSULE STUDY  05/2019   negative   VIDEO ASSISTED THORACOSCOPY (VATS)/DECORTICATION Right 03/12/2020   Procedure: VIDEO ASSISTED THORACOSCOPY (VATS)/DRAINING OF AN EMPYEMA;  Surgeon: Corliss Skains, MD;  Location: [facility] OR;  Service: Thoracic;  Laterality: Right;   XI ROBOTIC ASSISTED HIATAL HERNIA REPAIR N/A 03/12/2020   Procedure: XI ROBOTIC ASSISTED HIATAL HERNIA REPAIR USING ACELL MATRIX HIATAL;  Surgeon: Corliss Skains, MD;  Location: [facility] OR;  Service: Thoracic;  Laterality: N/A;    Family History  Problem Relation Age of Onset   Hypertension Mother    Thyroid disease Mother    Stroke Father 30   Liver cancer Maternal Uncle    Heart disease Brother    Hypertension Brother     Social History   Tobacco Use   Smoking status: Never   Smokeless tobacco: Never  Vaping Use   Vaping Use: Never used  Substance Use Topics   Alcohol use: Not Currently    Comment: quit ETOH 03/12/2020   Drug use: Yes    Types: Marijuana    Comment: gummy, last week     No Known Allergies  Health Maintenance  Topic Date Due   INFLUENZA VACCINE  07/06/2021   COVID-19 Vaccine (1) 08/05/2021 (Originally 06/04/1971)   Zoster Vaccines- Shingrix (1 of 2) 10/20/2021 (Originally 06/05/1987)   TETANUS/TDAP  01/19/2022 (Originally 06/05/1987)   Pneumococcal Vaccine 39-68 Years old (2 - PCV) 07/20/2022 (Originally 03/16/2021)   COLONOSCOPY (Pts 45-22yrs Insurance coverage will need to be confirmed)  04/16/2029   Hepatitis C Screening  Completed   HIV Screening  Completed   HPV VACCINES  Aged Out    Chart Review Today: I personally reviewed active problem list, medication list, allergies, family history, social history, health maintenance, notes from last encounter, lab results, imaging with the patient/caregiver  today.   Review of Systems  Constitutional: Negative.   HENT: Negative.    Eyes: Negative.   Respiratory: Negative.    Cardiovascular: Negative.   Gastrointestinal: Negative.   Endocrine: Negative.   Genitourinary: Negative.   Musculoskeletal: Negative.   Skin: Negative.   Allergic/Immunologic: Negative.   Neurological: Negative.   Hematological: Negative.   Psychiatric/Behavioral: Negative.    All other systems reviewed and are negative.   Objective:   Vitals:   07/20/21 1532  BP: 124/78  Pulse: 75  Resp: 18  Temp: (!) 97.5 F (36.4 C)  TempSrc: Oral  SpO2: 99%  Weight: 236 lb 9.6 oz (107.3 kg)  Height: 5\' 9"  (1.753 m)    Body mass index is 34.94 kg/m.  Physical Exam Vitals and nursing note reviewed.  Constitutional:      General: He is not  in acute distress.    Appearance: Normal appearance. He is well-developed. He is obese. He is not ill-appearing, toxic-appearing or diaphoretic.     Interventions: Face mask in place.  HENT:     Head: Normocephalic and atraumatic.     Jaw: No trismus.     Right Ear: External ear normal.     Left Ear: External ear normal.  Eyes:     General: Lids are normal. No scleral icterus.       Right eye: No discharge.        Left eye: No discharge.     Conjunctiva/sclera: Conjunctivae normal.  Neck:     Trachea: Trachea and phonation normal. No tracheal deviation.  Cardiovascular:     Rate and Rhythm: Normal rate. Rhythm irregularly irregular.     Pulses: Normal pulses.          Radial pulses are 2+ on the right side and 2+ on the left side.       Posterior tibial pulses are 2+ on the right side and 2+ on the left side.     Heart sounds: Normal heart sounds. No murmur heard.   No friction rub. No gallop.  Pulmonary:     Effort: Pulmonary effort is normal. No tachypnea, accessory muscle usage, prolonged expiration, respiratory distress or retractions.     Breath sounds: Normal breath sounds. No stridor. No decreased breath  sounds, wheezing, rhonchi or rales.  Abdominal:     General: Abdomen is protuberant. Bowel sounds are normal. There is no distension.     Palpations: Abdomen is soft.     Tenderness: There is no abdominal tenderness. There is no guarding or rebound.  Musculoskeletal:     Right lower leg: 1+ Pitting Edema present.     Left lower leg: 1+ Pitting Edema present.  Skin:    General: Skin is warm and dry.     Coloration: Skin is not jaundiced.     Findings: No rash.     Nails: There is no clubbing.  Neurological:     Mental Status: He is alert. Mental status is at baseline.     Cranial Nerves: No dysarthria or facial asymmetry.     Motor: No tremor or abnormal muscle tone.     Gait: Gait normal.  Psychiatric:        Mood and Affect: Mood normal.        Speech: Speech normal.        Behavior: Behavior normal. Behavior is cooperative.        Assessment & Plan:     ICD-10-CM   1. Essential hypertension  I10 olmesartan-hydrochlorothiazide (BENICAR HCT) 20-12.5 MG tablet    metoprolol tartrate (LOPRESSOR) 100 MG tablet    COMPLETE METABOLIC PANEL WITH GFR   stable, well controlled, BP at goal today much better with benicar daily and metroprolol 100 BID    2. Hypothyroidism, unspecified type  E03.9 TSH   pt not on levothyroxine for months - med ran out and he did not request refills, last labs TSH was in normal range but increased slightly, gained weight    3. Hyperlipidemia, unspecified hyperlipidemia type  E78.5 COMPLETE METABOLIC PANEL WITH GFR   lipids last checked Feb, elevated, not on statin, advised to work on diet/lifestyle    4. Barrett's esophagus without dysplasia  K22.70    no dysphagia or reflux sx, continue ppi daily and f/up with GI     5. Persistent atrial fibrillation (HCC)  I48.19  metoprolol tartrate (LOPRESSOR) 100 MG tablet    CBC with Differential/Platelet    COMPLETE METABOLIC PANEL WITH GFR   rate controlled, having DOE and orthopnea - check for effusion  again, possibly retaining some fluid?  BB SE vs CHF sx, no CP, no hypotension    6. Iron deficiency anemia, unspecified iron deficiency anemia type  D50.9 CBC with Differential/Platelet   recheck and monitor cbc    7. Elevated LFTs  R79.89 COMPLETE METABOLIC PANEL WITH GFR   monitoring, prior labs and LUQ [place] was showing improvement    8. DOE (dyspnea on exertion)  R06.00 DG Chest 2 View   BB SE? or recurrent CHF/afib sx with plueral effusion?  recheck CXR, weight up and some LE edema + orthopnea    9. OSA (obstructive sleep apnea)  G47.33 CBC with Differential/Platelet    Ambulatory referral to Pulmonology   never got supplies, tried to f/up with pulmonary but could not get ahold of them, PSG 11/2020 and f/up with LBPU FEb 2022 - did not get CPAP    10. Encounter for medication monitoring  Z51.81 CBC with Differential/Platelet    COMPLETE METABOLIC PANEL WITH GFR    TSH    11. Seasonal allergies  J30.2 fluticasone (FLONASE) 50 MCG/ACT nasal spray   on OTC antihistamine and flonase    12. Drug-induced erectile dysfunction  N52.2 sildenafil (VIAGRA) 100 MG tablet   refill on ED meds    13. Dyspnea, unspecified type  R06.00 DG Chest 2 View   exertional and orthopnea - when he first lays down SOB and uncomfortable     We will likely need to restart thyroid meds with 1/2 tab dose for a week then increase to daily dose - will send in refills pending lab results show he still needs   Return for 8 week thyroid f/up with labs.   Danelle Berry, PA-C 07/20/21 3:49 PM

## 2021-07-21 ENCOUNTER — Encounter: Payer: Self-pay | Admitting: Family Medicine

## 2021-07-21 ENCOUNTER — Other Ambulatory Visit: Payer: Self-pay | Admitting: Family Medicine

## 2021-07-21 DIAGNOSIS — E039 Hypothyroidism, unspecified: Secondary | ICD-10-CM

## 2021-07-21 LAB — CBC WITH DIFFERENTIAL/PLATELET
Absolute Monocytes: 945 cells/uL (ref 200–950)
Basophils Absolute: 41 cells/uL (ref 0–200)
Basophils Relative: 0.6 %
Eosinophils Absolute: 290 cells/uL (ref 15–500)
Eosinophils Relative: 4.2 %
HCT: 42.5 % (ref 38.5–50.0)
Hemoglobin: 13.5 g/dL (ref 13.2–17.1)
Lymphs Abs: 932 cells/uL (ref 850–3900)
MCH: 26.2 pg — ABNORMAL LOW (ref 27.0–33.0)
MCHC: 31.8 g/dL — ABNORMAL LOW (ref 32.0–36.0)
MCV: 82.5 fL (ref 80.0–100.0)
MPV: 9.8 fL (ref 7.5–12.5)
Monocytes Relative: 13.7 %
Neutro Abs: 4692 cells/uL (ref 1500–7800)
Neutrophils Relative %: 68 %
Platelets: 236 10*3/uL (ref 140–400)
RBC: 5.15 10*6/uL (ref 4.20–5.80)
RDW: 13.6 % (ref 11.0–15.0)
Total Lymphocyte: 13.5 %
WBC: 6.9 10*3/uL (ref 3.8–10.8)

## 2021-07-21 LAB — COMPLETE METABOLIC PANEL WITH GFR
AG Ratio: 1.4 (calc) (ref 1.0–2.5)
ALT: 16 U/L (ref 9–46)
AST: 15 U/L (ref 10–35)
Albumin: 4.3 g/dL (ref 3.6–5.1)
Alkaline phosphatase (APISO): 32 U/L — ABNORMAL LOW (ref 35–144)
BUN: 15 mg/dL (ref 7–25)
CO2: 27 mmol/L (ref 20–32)
Calcium: 9.7 mg/dL (ref 8.6–10.3)
Chloride: 97 mmol/L — ABNORMAL LOW (ref 98–110)
Creat: 1.06 mg/dL (ref 0.70–1.30)
Globulin: 3 g/dL (calc) (ref 1.9–3.7)
Glucose, Bld: 93 mg/dL (ref 65–99)
Potassium: 4.8 mmol/L (ref 3.5–5.3)
Sodium: 131 mmol/L — ABNORMAL LOW (ref 135–146)
Total Bilirubin: 1.6 mg/dL — ABNORMAL HIGH (ref 0.2–1.2)
Total Protein: 7.3 g/dL (ref 6.1–8.1)
eGFR: 84 mL/min/{1.73_m2} (ref >=60)

## 2021-07-21 LAB — TSH: TSH: 5.23 mIU/L — ABNORMAL HIGH (ref 0.40–4.50)

## 2021-07-21 MED ORDER — LEVOTHYROXINE SODIUM 25 MCG PO TABS: ORAL_TABLET | ORAL | 0 refills | Status: DC

## 2021-07-21 MED ORDER — LEVOTHYROXINE SODIUM 75 MCG PO TABS: 75.0000 ug | ORAL_TABLET | Freq: Every day | ORAL | 3 refills | Status: DC

## 2021-07-31 ENCOUNTER — Other Ambulatory Visit: Payer: Self-pay

## 2021-07-31 ENCOUNTER — Ambulatory Visit: Payer: Medicaid Other | Admitting: Cardiology

## 2021-07-31 ENCOUNTER — Encounter: Payer: Self-pay | Admitting: Cardiology

## 2021-07-31 VITALS — BP 130/86 | HR 65 | Ht 68.0 in | Wt 236.0 lb

## 2021-07-31 DIAGNOSIS — I10 Essential (primary) hypertension: Secondary | ICD-10-CM | POA: Diagnosis not present

## 2021-07-31 DIAGNOSIS — I4819 Other persistent atrial fibrillation: Secondary | ICD-10-CM | POA: Diagnosis not present

## 2021-07-31 NOTE — Progress Notes (Signed)
Cardiology Office Note:    Date:  07/31/2021   ID:  Dean Anderson, DOB Jun 29, 1966, MRN [id number]  PCP:  Danelle Berry, PA-C  Cardiologist:  Debbe Odea, MD  Electrophysiologist:  None   Referring MD: Danelle Berry, PA-C   Chief Complaint  Patient presents with   Other    6 month follow up -- Meds reviewed verbally with patient.     History of Present Illness:    Dean Anderson is a 55 y.o. male with a hx of persistent A. Fib (not on anticoagulation due to GI bleeds.), hypertension, OSA who presents for follow-up.  Previously seen for A. fib and hypertension.    Denies palpitations, states feeling tired, has gained some weight since last visit.  His CPAP mask was ordered, supposed to be getting basal to treat OSA.  Denies dizziness.  Tolerating Lopressor.  Has follow-up appointment with GI soon to evaluate GI bleeds.  Has no new concerns at this time.   Prior notes Echo 04/2020 showed normal systolic function, EF 60 to 65%, moderate LA dilatation, aortic valve sclerosis. Lexiscan Myoview 02/2020 no evidence for ischemia, low risk scan.  Since diagnosis of A. fib around 2019, he was  put on blood thinners but subsequently developed an anemia.  GI work-up has since been unrevealing.  He states noticing blood in his stool sometimes.   History of dyspnea status post VATS draining of empyema and decortication, hiatal hernia repair.  Shortness of breath improved after surgery.   Past Medical History:  Diagnosis Date   Acute gastritis without hemorrhage    Anemia    Atrial fibrillation (HCC)    Dyspnea    Family history of adverse reaction to anesthesia    brother woke up in a rage after geting anesthesia for teeth surgery   GERD (gastroesophageal reflux disease)    Hematochezia    History of hiatal hernia    Hypertension    Hypothyroidism    Pleural effusion on right 01/11/2020    Onset of symptoms early 2021 assoc with hbp/afib with neg cards w/u 02/14/20  - Thoracentesis  02/20/2020 >>>   2000 cc  Wbc 267  L > polys  Exudative by protein  with glucose wnl  Cytology >  Still large effusion  Present with loculations > refer to T surgery for ? VATS   Thyroid disease     Past Surgical History:  Procedure Laterality Date   COLONOSCOPY WITH PROPOFOL N/A 04/17/2019   Procedure: COLONOSCOPY WITH PROPOFOL;  Surgeon: Midge Minium, MD;  Location: [facility] ENDOSCOPY;  Service: Endoscopy;  Laterality: N/A;   ESOPHAGOGASTRODUODENOSCOPY N/A 03/12/2020   Procedure: ESOPHAGOGASTRODUODENOSCOPY (EGD);  Surgeon: Corliss Skains, MD;  Location: [facility] OR;  Service: Thoracic;  Laterality: N/A;   ESOPHAGOGASTRODUODENOSCOPY (EGD) WITH PROPOFOL N/A 04/17/2019   Procedure: ESOPHAGOGASTRODUODENOSCOPY (EGD) WITH PROPOFOL;  Surgeon: Midge Minium, MD;  Location: [facility] ENDOSCOPY;  Service: Endoscopy;  Laterality: N/A;   GIVENS CAPSULE STUDY  05/2019   negative   VIDEO ASSISTED THORACOSCOPY (VATS)/DECORTICATION Right 03/12/2020   Procedure: VIDEO ASSISTED THORACOSCOPY (VATS)/DRAINING OF AN EMPYEMA;  Surgeon: Corliss Skains, MD;  Location: [facility] OR;  Service: Thoracic;  Laterality: Right;   XI ROBOTIC ASSISTED HIATAL HERNIA REPAIR N/A 03/12/2020   Procedure: XI ROBOTIC ASSISTED HIATAL HERNIA REPAIR USING ACELL MATRIX HIATAL;  Surgeon: Corliss Skains, MD;  Location: [facility] OR;  Service: Thoracic;  Laterality: N/A;    Current Medications: Current Meds  Medication Sig   fluticasone (FLONASE) 50 MCG/ACT nasal  spray Place 2 sprays into both nostrils daily as needed for allergies or rhinitis.   levothyroxine (SYNTHROID) 25 MCG tablet Take 1 tablet (25 mcg total) by mouth daily before breakfast for 7 days, THEN 2 tablets (50 mcg total) daily before breakfast for 7 days.   [START ON 08/05/2021] levothyroxine (SYNTHROID) 75 MCG tablet Take 1 tablet (75 mcg total) by mouth daily before breakfast.   metoprolol tartrate (LOPRESSOR) 100 MG tablet Take 1 tablet (100 mg total) by mouth 2 (two) times daily.    [START ON 08/12/2021] olmesartan-hydrochlorothiazide (BENICAR HCT) 20-12.5 MG tablet Take 1 tablet by mouth daily.   omeprazole (PRILOSEC) 20 MG capsule Take 1 capsule (20 mg total) by mouth at bedtime.   sildenafil (VIAGRA) 100 MG tablet Take 1 tablet (100 mg total) by mouth daily as needed for erectile dysfunction.     Allergies:   Patient has no known allergies.   Social History   Socioeconomic History   Marital status: Married    Spouse name: [place]   Number of children: Not on file   Years of education: 12   Highest education level: Some college, no degree  Occupational History   Not on file  Tobacco Use   Smoking status: Never   Smokeless tobacco: Never  Vaping Use   Vaping Use: Never used  Substance and Sexual Activity   Alcohol use: Not Currently    Comment: quit ETOH 03/12/2020   Drug use: Yes    Types: Marijuana    Comment: gummy, last week   Sexual activity: Yes  Other Topics Concern   Not on file  Social History Narrative   alcohol use- cut back April 2021.  Lives with his wife at home.   No lung dx hx, grew up with second hand smoke, wife smokes out of home   Social Determinants of Health   Financial Resource Strain: Not on file  Food Insecurity: Not on file  Transportation Needs: Not on file  Physical Activity: Not on file  Stress: Not on file  Social Connections: Not on file     Family History: The patient's family history includes Heart disease in his brother; Hypertension in his brother and mother; Liver cancer in his maternal uncle; Stroke (age of onset: 61) in his father; Thyroid disease in his mother.  ROS:   Please see the history of present illness.     All other systems reviewed and are negative.  EKGs/Labs/Other Studies Reviewed:    The following studies were reviewed today:   EKG:  EKG is  ordered today.  EKG shows atrial fibrillation, heart rate 65  Recent Labs: 07/20/2021: ALT 16; BUN 15; Creat 1.06; Hemoglobin 13.5; Platelets 236;  Potassium 4.8; Sodium 131; TSH 5.23  Recent Lipid Panel    Component Value Date/Time   CHOL 183 01/19/2021 1205   CHOL 209 (H) 05/21/2020 1649   TRIG 175 (H) 01/19/2021 1205   TRIG 240 (H) 05/21/2020 1649   HDL 55 01/19/2021 1205   CHOLHDL 3.3 01/19/2021 1205   LDLCALC 101 (H) 01/19/2021 1205    Physical Exam:    VS:  BP 130/86 (BP Location: Left Arm, Patient Position: Sitting, Cuff Size: Normal)   Pulse 65   Ht 5\' 8"  (1.727 m)   Wt 236 lb (107 kg)   SpO2 96%   BMI 35.88 kg/m     Wt Readings from Last 3 Encounters:  07/31/21 236 lb (107 kg)  07/20/21 236 lb 9.6 oz (107.3 kg)  01/28/21 230 lb (104.3 kg)     GEN:  Well nourished, well developed in no acute distress HEENT: Normal NECK: No JVD; No carotid bruits LYMPHATICS: No lymphadenopathy CARDIAC: Irregular irregular, no murmurs, rubs, gallops RESPIRATORY: Decreased breath sounds at the right lung base, clear anteriorly ABDOMEN: Soft, non-tender, non-distended MUSCULOSKELETAL:  No edema; No deformity  SKIN: Warm and dry NEUROLOGIC:  Alert and oriented x 3 PSYCHIATRIC:  Normal affect   ASSESSMENT:    1. Persistent atrial fibrillation (HCC)   2. Primary hypertension     PLAN:    In order of problems listed above:  Patient with persistent atrial fibrillation.  CHA2DS2-VASc: 1(htn).  Not on anticoagulation due to GI bleeding and anemia when anticoagulation was tried in the past. GI work-up is so far has shown hemorrhoidal bleeding as only possible etiology.  Continue Lopressor to 100 mg twice daily.  Okay not to anticoagulate due to CHA2DS2-VASc of 1.  Consider watchman for higher CHA2DS2-VASc scores. hypertension, BP controlled, continue Lopressor, olmesartan 20mg -HCTZ 12.5 mg daily.    Follow-up in 6 months  Total encounter time minutes  Greater than 50% was spent in counseling and coordination of care with the patient Medication management.  This note was generated in part or whole with voice  recognition software. Voice recognition is usually quite accurate but there are transcription errors that can and very often do occur. I apologize for any typographical errors that were not detected and corrected.  Medication Adjustments/Labs and Tests Ordered: Current medicines are reviewed at length with the patient today.  Concerns regarding medicines are outlined above.  Orders Placed This Encounter  Procedures   EKG 12-Lead    No orders of the defined types were placed in this encounter.   Patient Instructions  Medication Instructions:  Your physician recommends that you continue on your current medications as directed. Please refer to the Current Medication list given to you today.  *If you need a refill on your cardiac medications before your next appointment, please call your pharmacy*   Lab Work: None ordered If you have labs (blood work) drawn today and your tests are completely normal, you will receive your results only by: MyChart Message (if you have MyChart) OR A paper copy in the mail If you have any lab test that is abnormal or we need to change your treatment, we will call you to review the results.   Testing/Procedures: None ordered   Follow-Up: At [facility], you and your health needs are our priority.  As part of our continuing mission to provide you with exceptional heart care, we have created designated Provider Care Teams.  These Care Teams include your primary Cardiologist (physician) and Advanced Practice Providers (APPs -  Physician Assistants and Nurse Practitioners) who all work together to provide you with the care you need, when you need it.  We recommend signing up for the patient portal called "MyChart".  Sign up information is provided on this After Visit Summary.  MyChart is used to connect with patients for Virtual Visits (Telemedicine).  Patients are able to view lab/test results, encounter notes, upcoming appointments, etc.  Non-urgent  messages can be sent to your provider as well.   To learn more about what you can do with MyChart, go to [facility].    Your next appointment:   6 month(s)  The format for your next appointment:   In Person  Provider:   You may see [email or web address], MD or one of the following  Advanced Practice Providers on your designated Care Team:   Nicolasa Duckinghristopher Berge, NP Eula Listenyan Dunn, PA-C Marisue IvanJacquelyn Visser, PA-C Cadence Grand BayFurth, [place] JerseyPA-C   Other Instructions    Signed, Debbe OdeaBrian Agbor-Etang, MD  07/31/2021 4:52 PM     [facility]

## 2021-07-31 NOTE — Patient Instructions (Signed)
Medication Instructions:  Your physician recommends that you continue on your current medications as directed. Please refer to the Current Medication list given to you today.  *If you need a refill on your cardiac medications before your next appointment, please call your pharmacy*   Lab Work: None ordered If you have labs (blood work) drawn today and your tests are completely normal, you will receive your results only by: MyChart Message (if you have MyChart) OR A paper copy in the mail If you have any lab test that is abnormal or we need to change your treatment, we will call you to review the results.   Testing/Procedures: None ordered   Follow-Up: At [facility], you and your health needs are our priority.  As part of our continuing mission to provide you with exceptional heart care, we have created designated Provider Care Teams.  These Care Teams include your primary Cardiologist (physician) and Advanced Practice Providers (APPs -  Physician Assistants and Nurse Practitioners) who all work together to provide you with the care you need, when you need it.  We recommend signing up for the patient portal called "MyChart".  Sign up information is provided on this After Visit Summary.  MyChart is used to connect with patients for Virtual Visits (Telemedicine).  Patients are able to view lab/test results, encounter notes, upcoming appointments, etc.  Non-urgent messages can be sent to your provider as well.   To learn more about what you can do with MyChart, go to [email or web address].    Your next appointment:   6 month(s)  The format for your next appointment:   In Person  Provider:   You may see Brian Agbor-Etang, MD or one of the following Advanced Practice Providers on your designated Care Team:   Christopher Berge, NP Ryan Dunn, PA-C Jacquelyn Visser, PA-C Cadence Furth, PA-C   Other Instructions   

## 2021-08-06 DIAGNOSIS — I10 Essential (primary) hypertension: Secondary | ICD-10-CM | POA: Diagnosis not present

## 2021-08-06 DIAGNOSIS — G4733 Obstructive sleep apnea (adult) (pediatric): Secondary | ICD-10-CM | POA: Diagnosis not present

## 2021-08-12 ENCOUNTER — Telehealth: Payer: Self-pay | Admitting: Pulmonary Disease

## 2021-08-12 NOTE — Telephone Encounter (Signed)
Received CPAP set up confirmation from [employer]   Pt started CPAP on 08/06/21 Luna 3   Needs f/u here 09/06/21- 11/04/21   Tried calling the pt to schedule appt and there was no answer and no option to leave vm, will call back.

## 2021-08-19 ENCOUNTER — Telehealth (INDEPENDENT_AMBULATORY_CARE_PROVIDER_SITE_OTHER): Payer: Medicaid Other | Admitting: Family Medicine

## 2021-08-19 ENCOUNTER — Encounter: Payer: Self-pay | Admitting: Family Medicine

## 2021-08-19 VITALS — Ht 68.0 in | Wt 236.0 lb

## 2021-08-19 DIAGNOSIS — J988 Other specified respiratory disorders: Secondary | ICD-10-CM

## 2021-08-19 DIAGNOSIS — E039 Hypothyroidism, unspecified: Secondary | ICD-10-CM | POA: Diagnosis not present

## 2021-08-19 DIAGNOSIS — R0600 Dyspnea, unspecified: Secondary | ICD-10-CM

## 2021-08-19 DIAGNOSIS — I4819 Other persistent atrial fibrillation: Secondary | ICD-10-CM

## 2021-08-19 DIAGNOSIS — U071 COVID-19: Secondary | ICD-10-CM

## 2021-08-19 DIAGNOSIS — G4733 Obstructive sleep apnea (adult) (pediatric): Secondary | ICD-10-CM

## 2021-08-19 DIAGNOSIS — J069 Acute upper respiratory infection, unspecified: Secondary | ICD-10-CM

## 2021-08-19 DIAGNOSIS — Z20822 Contact with and (suspected) exposure to covid-19: Secondary | ICD-10-CM | POA: Diagnosis not present

## 2021-08-19 DIAGNOSIS — R0609 Other forms of dyspnea: Secondary | ICD-10-CM

## 2021-08-19 MED ORDER — BENZONATATE 100 MG PO CAPS: 100.0000 mg | ORAL_CAPSULE | Freq: Three times a day (TID) | ORAL | 0 refills | Status: DC | PRN

## 2021-08-19 NOTE — Progress Notes (Signed)
Name: Dean Anderson   MRN: [id number]    DOB: 1966/01/22   Date:08/19/2021       Progress Note  Subjective:    Chief Complaint  Chief Complaint  Patient presents with   Cough    Runny nose and congestion x3 days    I connected with  [name] on 08/19/21 at  2:20 PM EDT by telephone and verified that I am speaking with the correct person using two identifiers.   I discussed the limitations, risks, security and privacy concerns of performing an evaluation and management service by telephone and the availability of in person appointments. Staff also discussed with the patient that there may be a patient responsible charge related to this service.  Patient verbalized understanding and agreed to proceed with encounter. Patient Location: home Provider Location: [facility] Additional Individuals present: none  HPI Acute visit - feeling "under the weather" started over the weekend wife and child got sick with URI/sinus sx He developed sneezing, nasal discharge, post-nasal drip, congestion, coughing/choking on stuff in throat  Sx started yesterday  On generic zyrtec or allergy pill, flonase, robitussin  HR 136/80     Patient Active Problem List   Diagnosis Date Noted   OSA (obstructive sleep apnea) 10/28/2020   History of COVID-19 10/28/2020   Hypothyroidism 06/26/2020   Hepatic steatosis 06/26/2020   Hyperlipidemia 06/26/2020   History of repair of hiatal hernia 03/12/2020   Hyponatremia 02/20/2020   Iron deficiency anemia    Barrett's esophagus without dysplasia    Essential hypertension 10/02/2018   DOE (dyspnea on exertion) 10/02/2018   Persistent atrial fibrillation (HCC) 09/21/2018    Social History   Tobacco Use   Smoking status: Never   Smokeless tobacco: Never  Substance Use Topics   Alcohol use: Not Currently    Comment: quit ETOH 03/12/2020     Current Outpatient Medications:    fluticasone (FLONASE) 50 MCG/ACT nasal spray, Place 2 sprays into both  nostrils daily as needed for allergies or rhinitis., Disp: 16 g, Rfl: 5   levothyroxine (SYNTHROID) 75 MCG tablet, Take 1 tablet (75 mcg total) by mouth daily before breakfast., Disp: 90 tablet, Rfl: 3   metoprolol tartrate (LOPRESSOR) 100 MG tablet, Take 1 tablet (100 mg total) by mouth 2 (two) times daily., Disp: 180 tablet, Rfl: 3   olmesartan-hydrochlorothiazide (BENICAR HCT) 20-12.5 MG tablet, Take 1 tablet by mouth daily., Disp: 90 tablet, Rfl: 3   omeprazole (PRILOSEC) 20 MG capsule, Take 1 capsule (20 mg total) by mouth at bedtime., Disp: 90 capsule, Rfl: 3   sildenafil (VIAGRA) 100 MG tablet, Take 1 tablet (100 mg total) by mouth daily as needed for erectile dysfunction., Disp: 20 tablet, Rfl: 11   levothyroxine (SYNTHROID) 25 MCG tablet, Take 1 tablet (25 mcg total) by mouth daily before breakfast for 7 days, THEN 2 tablets (50 mcg total) daily before breakfast for 7 days., Disp: 21 tablet, Rfl: 0  No Known Allergies  Chart Review: I personally reviewed active problem list, medication list, allergies, family history, social history, health maintenance, notes from last encounter, lab results, imaging with the patient/caregiver today.   Review of Systems  Constitutional: Negative.  Negative for chills, diaphoresis, fatigue and fever.  HENT: Negative.    Eyes: Negative.   Respiratory:  Positive for cough. Negative for shortness of breath and wheezing.   Cardiovascular: Negative.  Negative for chest pain, palpitations and leg swelling.  Gastrointestinal: Negative.   Endocrine: Negative.   Genitourinary: Negative.  Musculoskeletal: Negative.   Skin: Negative.   Allergic/Immunologic: Negative.   Neurological: Negative.   Hematological: Negative.   Psychiatric/Behavioral: Negative.    All other systems reviewed and are negative.   Objective:    Virtual encounter, vitals limited, only able to obtain the following Today's Vitals   08/19/21 1358  Weight: 236 lb (107 kg)   Height: 5\' 8"  (1.727 m)   Body mass index is 35.88 kg/m. Nursing Note and Vital Signs reviewed.  Physical Exam Vitals and nursing note reviewed.  Pulmonary:     Effort: No respiratory distress.  Neurological:     Mental Status: He is alert.  Psychiatric:        Mood and Affect: Mood normal.    PE limited by telephone encounter  No results found for this or any previous visit (from the past 72 hour(s)).  Assessment and Plan:     ICD-10-CM   1. Suspected COVID-19 virus infection  Z20.822 Novel Coronavirus, NAA ([facility])    2. Upper respiratory tract infection, unspecified type  J06.9 Novel Coronavirus, NAA ([facility])    benzonatate (TESSALON) 100 MG capsule    3. Hypothyroidism, unspecified type  E03.9    pt is back on full dose of levothyroxine, no SE, feeling good    4. Persistent atrial fibrillation (HCC)  I48.19    rate controlled, no change with HR with resuming thyroid meds - continue BB and continue watching for signs of fluid overload, and continue BP monitoring    5. DOE (dyspnea on exertion)  R06.00    sx baseline, no worsening other than coughing up post-nasal drainage    6. OSA (obstructive sleep apnea)  G47.33    sleeping well at night, URI sx actually better with CPAP on - feel pt is doing well as long as tolerating CPAP and sleeping well at night w/o orthpnea/SOB/CP    Supportive and sx care advised, likely viral, self limiting, no current indication for abx, test for covid and monitor  1 day URI sx, no SOB, CP - HR and BP are good, no signs of fluid overload Profuse drainage, congestion, sneezing Instructed pt to manage with OTC antihistamines, can try mucinex, tessalon for cough - be careful with other cough meds or decongestants with cardiac hx, come by and test for covid, and monitor  With pts recent changes in health (pleural effusion etc) would give abx sooner than later if he starts to worsen acutely Would be careful if he needs steroids - and  monitor his LE edema'/BP/weights carefully  If not improving in 1-2 weeks f/up If any sudden worsening pt will need f/up and possibly urgent care visit   -Red flags and when to present for emergency care or RTC including but not limited to new/worsening/un-resolving symptoms,  reviewed with patient at time of visit. Follow up and care instructions discussed and provided in AVS. - I discussed the assessment and treatment plan with the patient. The patient was provided an opportunity to ask questions and all were answered. The patient agreed with the plan and demonstrated an understanding of the instructions.  - The patient was advised to call back or seek an in-person evaluation if the symptoms worsen or if the condition fails to improve as anticipated.  I provided 20 minutes of non-face-to-face time during this encounter.  , PA-C 08/19/21 2:50 PM

## 2021-08-21 LAB — NOVEL CORONAVIRUS, NAA: SARS-CoV-2, NAA: DETECTED — AB

## 2021-08-21 LAB — SARS-COV-2, NAA 2 DAY TAT

## 2021-08-21 LAB — SPECIMEN STATUS REPORT

## 2021-08-21 MED ORDER — NIRMATRELVIR/RITONAVIR (PAXLOVID)TABLET: 3.0000 | ORAL_TABLET | Freq: Two times a day (BID) | ORAL | 0 refills | Status: AC

## 2021-08-21 NOTE — Addendum Note (Signed)
Addended by: Danelle Berry on: 08/21/2021 06:14 PM   Modules accepted: Orders

## 2021-08-21 NOTE — Telephone Encounter (Signed)
Video visit 09/24/21 with TP scheduled

## 2021-08-26 ENCOUNTER — Ambulatory Visit: Payer: Medicaid Other | Admitting: Gastroenterology

## 2021-08-26 ENCOUNTER — Other Ambulatory Visit: Payer: Self-pay

## 2021-08-26 ENCOUNTER — Encounter: Payer: Self-pay | Admitting: Gastroenterology

## 2021-08-26 VITALS — BP 159/114 | HR 80 | Wt 236.8 lb

## 2021-08-26 DIAGNOSIS — K760 Fatty (change of) liver, not elsewhere classified: Secondary | ICD-10-CM | POA: Diagnosis not present

## 2021-08-26 DIAGNOSIS — K2270 Barrett's esophagus without dysplasia: Secondary | ICD-10-CM

## 2021-08-26 NOTE — Progress Notes (Signed)
[name], MD [address]  [address]  [place], [place] [address]  Main: [phone]  Fax: [phone]   Primary Care Physician: Danelle Berry, PA-C   Chief complaint: Fatty liver  HPI: Dean Anderson is a 55 y.o. male with a history of Barrett's esophagus, fatty liver, here for follow-up. The patient denies abdominal or flank pain, anorexia, nausea or vomiting, dysphagia, change in bowel habits or black or bloody stools or weight loss.  Patient taking low-dose Prilosec daily, and denies any breakthrough symptoms.  Good appetite.  Continues to drink alcohol daily.  Describes drinking 5 beers a day for years.  Denies any lower extremity edema, episodes of confusion or bleeding.  As noted in previous notes, he has not had any clinical evidence of cirrhosis.  He has not had any further pleural effusions since his hiatal hernia repair in 2021.  FibroSure level in June 2021 showed steatosis, F1 fibrosis, not consistent with cirrhosis.  Most recent TSH was elevated, and is being followed by PCP, with the most recent recommendations being to restart levothyroxine as per 07/20/2021 note.  Note reviewed.  Previous history: So far his work-up has shown ultrasound reporting hepatic steatosis, FibroSure level not consistent with cirrhosis.  Normal platelets, albumin not consistent with cirrhosis either.  History of pleural effusions in 2021 requiring thoracentesis, and VATS procedure, along with hiatal hernia repair, previous history of alcohol abuse-abstinent since March or April 2021, elevated liver enzymes  Also has history of H. pylori that was treated, Barrett's esophagus, hiatal hernia,   previously seen in April 2020 due to iron deficiency anemia anemia and intermittent bright red blood per rectum on Eliquis.  May 2020 Upper endoscopy , Dr. Servando Snare 6 cm long segment Barrett's, biopsied every 2 cm Gastric erythema Hiatal hernia  Colonoscopy, Dr. Servando Snare, with nonbleeding  internal hemorrhoids, otherwise normal  Pathology showed H. 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 Leone Payor with no etiology of iron deficiency anemia on small bowel exam  CT scan done by hematology oncology do not reveal any sources of iron deficiency anemia except for a large hiatal hernia for which I did look use has been placed with some water when my I was just postpartum like a lot of like foods and my mom he was referred to surgery  He also had mildly elevated liver enzymes and reported daily alcohol use.  He was advised to abstain from alcohol use.  Further viral and autoimmune hepatitis work-up was negative.  Right upper quadrant ultrasound indicated hepatic steatosis with no focal liver lesions   ROS: All ROS reviewed and negative except as per HPI   Past Medical History:  Diagnosis Date   Acute gastritis without hemorrhage    Anemia    Atrial fibrillation (HCC)    Dyspnea    Family history of adverse reaction to anesthesia    brother woke up in a rage after geting anesthesia for teeth surgery   GERD (gastroesophageal reflux disease)    Hematochezia    History of hiatal hernia    Hypertension    Hypothyroidism    Pleural effusion on right 01/11/2020    Onset of symptoms early 2021 assoc with hbp/afib with neg cards w/u 02/14/20  - Thoracentesis 02/20/2020 >>>   2000 cc  Wbc 267  L > polys  Exudative by protein  with glucose wnl  Cytology >  Still large effusion  Present with loculations > refer to T surgery  for ? VATS   Thyroid disease     Past Surgical History:  Procedure Laterality Date   COLONOSCOPY WITH PROPOFOL N/A 04/17/2019   Procedure: COLONOSCOPY WITH PROPOFOL;  Surgeon: Midge Minium, MD;  Location: [facility] ENDOSCOPY;  Service: Endoscopy;  Laterality: N/A;   ESOPHAGOGASTRODUODENOSCOPY N/A 03/12/2020   Procedure: ESOPHAGOGASTRODUODENOSCOPY (EGD);  Surgeon: Corliss Skains, MD;  Location: [facility] OR;  Service:  Thoracic;  Laterality: N/A;   ESOPHAGOGASTRODUODENOSCOPY (EGD) WITH PROPOFOL N/A 04/17/2019   Procedure: ESOPHAGOGASTRODUODENOSCOPY (EGD) WITH PROPOFOL;  Surgeon: Midge Minium, MD;  Location: [facility] ENDOSCOPY;  Service: Endoscopy;  Laterality: N/A;   GIVENS CAPSULE STUDY  05/2019   negative   VIDEO ASSISTED THORACOSCOPY (VATS)/DECORTICATION Right 03/12/2020   Procedure: VIDEO ASSISTED THORACOSCOPY (VATS)/DRAINING OF AN EMPYEMA;  Surgeon: Corliss Skains, MD;  Location: [facility] OR;  Service: Thoracic;  Laterality: Right;   XI ROBOTIC ASSISTED HIATAL HERNIA REPAIR N/A 03/12/2020   Procedure: XI ROBOTIC ASSISTED HIATAL HERNIA REPAIR USING ACELL MATRIX HIATAL;  Surgeon: Corliss Skains, MD;  Location: [facility] OR;  Service: Thoracic;  Laterality: N/A;    Prior to Admission medications   Medication Sig Start Date End Date Taking? Authorizing Provider  benzonatate (TESSALON) 100 MG capsule Take 1-2 capsules (100-200 mg total) by mouth 3 (three) times daily as needed for cough. 08/19/21  Yes Danelle Berry, PA-C  fluticasone (FLONASE) 50 MCG/ACT nasal spray Place 2 sprays into both nostrils daily as needed for allergies or rhinitis. 07/20/21  Yes Danelle Berry, PA-C  levothyroxine (SYNTHROID) 75 MCG tablet Take 1 tablet (75 mcg total) by mouth daily before breakfast. 08/05/21  Yes Danelle Berry, PA-C  metoprolol tartrate (LOPRESSOR) 100 MG tablet Take 1 tablet (100 mg total) by mouth 2 (two) times daily. 07/20/21  Yes Danelle Berry, PA-C  nirmatrelvir/ritonavir EUA (PAXLOVID) 20 x 150 MG & 10 x 100MG  TABS Take 3 tablets by mouth 2 (two) times daily for 5 days. (Take nirmatrelvir 150 mg two tablets twice daily for 5 days and ritonavir 100 mg one tablet twice daily for 5 days) Patient GFR is 88 08/21/21 08/26/21 Yes Tapia, Leisa, PA-C  olmesartan-hydrochlorothiazide (BENICAR HCT) 20-12.5 MG tablet Take 1 tablet by mouth daily. 08/12/21  Yes 10/12/21, PA-C  omeprazole (PRILOSEC) 20 MG capsule Take 1 capsule (20 mg total) by  mouth at bedtime. 04/30/21  Yes 05/02/21, PA-C  levothyroxine (SYNTHROID) 25 MCG tablet Take 1 tablet (25 mcg total) by mouth daily before breakfast for 7 days, THEN 2 tablets (50 mcg total) daily before breakfast for 7 days. 07/22/21 08/05/21  08/07/21, PA-C    Family History  Problem Relation Age of Onset   Hypertension Mother    Thyroid disease Mother    Stroke Father 46   Liver cancer Maternal Uncle    Heart disease Brother    Hypertension Brother      Social History   Tobacco Use   Smoking status: Never   Smokeless tobacco: Never  Vaping Use   Vaping Use: Never used  Substance Use Topics   Alcohol use: Not Currently    Comment: quit ETOH 03/12/2020   Drug use: Yes    Types: Marijuana    Comment: gummy, last week    Allergies as of 08/26/2021   (No Known Allergies)    Physical Examination:  Constitutional: General:   Alert,  Well-developed, well-nourished, pleasant and cooperative in NAD BP (!) 159/114   Pulse 80   Wt 236 lb 12.8 oz (107.4 kg)  BMI 36.01 kg/m   Respiratory: Normal respiratory effort  Gastrointestinal:  Soft, non-tender and non-distended without masses, hepatosplenomegaly or hernias noted.  No guarding or rebound tenderness.     Cardiac: No clubbing or edema.  No cyanosis. Normal posterior tibial pedal pulses noted.  Psych:  Alert and cooperative. Normal mood and affect.  Musculoskeletal:  Normal gait. Head normocephalic, atraumatic. Symmetrical without gross deformities. 5/5 Lower extremity strength bilaterally.  Skin: Warm. Intact without significant lesions or rashes. No jaundice.  Neck: Supple, trachea midline  Lymph: No cervical lymphadenopathy  Psych:  Alert and oriented x3, Alert and cooperative. Normal mood and affect.  Labs: CMP     Component Value Date/Time   NA 131 (L) 07/20/2021 1614   NA 138 04/13/2019 1156   K 4.8 07/20/2021 1614   CL 97 (L) 07/20/2021 1614   CO2 27 07/20/2021 1614   GLUCOSE 93 07/20/2021 1614    BUN 15 07/20/2021 1614   BUN 8 04/13/2019 1156   CREATININE 1.06 07/20/2021 1614   CALCIUM 9.7 07/20/2021 1614   PROT 7.3 07/20/2021 1614   PROT 6.7 06/13/2019 1337   ALBUMIN 3.2 (L) 03/14/2020 0550   ALBUMIN 4.0 06/13/2019 1337   AST 15 07/20/2021 1614   ALT 16 07/20/2021 1614   ALKPHOS 31 (L) 03/14/2020 0550   BILITOT 1.6 (H) 07/20/2021 1614   BILITOT 1.2 07/23/2020 1541   GFRNONAA 79 01/19/2021 1205   GFRAA 92 01/19/2021 1205   Lab Results  Component Value Date   WBC 6.9 07/20/2021   HGB 13.5 07/20/2021   HCT 42.5 07/20/2021   MCV 82.5 07/20/2021   PLT 236 07/20/2021   TSH 5.23  Imaging Studies:   Assessment and Plan:   Rathana Viveros is a 55 y.o. y/o male here for follow-up of for chronic history of fatty liver and Barrett's esophagus  Barrett's esophagus without dysplasia On low-dose PPI indefinitely as per guidelines 6 cm segment, biopsied in 2020 by Dr. Servando Snare Repeat upper endoscopy due in 2023 Continue acid reflux lifestyle modifications  (Risks of PPI use were discussed with patient including bone loss, C. Diff diarrhea, pneumonia, infections, CKD, electrolyte abnormalities.  Pt. Verbalizes understanding and chooses to continue the medication.)  Fatty liver Due to daily alcohol use, and obesity as well Patient advised to start decreasing alcohol intake, and eventually abstain from alcohol use to prevent disease from worsening into cirrhosis.  Weight loss recommended as well.  Handout for fatty liver given. Most recent ultrasound did not show changes in liver parenchyma, but previous ones have shown this. Most recent CBC and CMP do not show evidence of cirrhosis with normal platelets, and reassuring liver enzymes. He is not immune to hepatitis a and B.  Vaccination was recommended and offered in our clinic today, but pt declined  Dr [name]

## 2021-08-26 NOTE — Patient Instructions (Signed)
Fatty Liver Disease  The liver converts food into energy, removes toxic material from the blood, makes important proteins, and absorbs necessary vitamins from food. Fatty liver disease occurs when too much fat has built up in your liver cells. Fatty liverdisease is also called hepatic steatosis. In many cases, fatty liver disease does not cause symptoms or problems. It is often diagnosed when tests are being done for other reasons. However, over time, fatty liver can cause inflammation that may lead to more serious liver problems, such as scarring of the liver (cirrhosis) and liver failure. Fatty liver is associated with insulin resistance, increased body fat, high blood pressure (hypertension), and high cholesterol. These are features of metabolic syndrome and increaseyour risk for stroke, diabetes, and heart disease. What are the causes? This condition may be caused by components of metabolic syndrome: Obesity. Insulin resistance. High cholesterol. Other causes: Alcohol abuse. Poor nutrition. Cushing syndrome. Pregnancy. Certain drugs. Poisons. Some viral infections. What increases the risk? You are more likely to develop this condition if you: Abuse alcohol. Are overweight. Have diabetes. Have hepatitis. Have a high triglyceride level. Are pregnant. What are the signs or symptoms? Fatty liver disease often does not cause symptoms. If symptoms do develop, they can include: Fatigue and weakness. Weight loss. Confusion. Nausea, vomiting, or abdominal pain. Yellowing of your skin and the white parts of your eyes (jaundice). Itchy skin. How is this diagnosed? This condition may be diagnosed by: A physical exam and your medical history. Blood tests. Imaging tests, such as an ultrasound, CT scan, or MRI. A liver biopsy. A small sample of liver tissue is removed using a needle. The sample is then looked at under a microscope. How is this treated? Fatty liver disease is often  caused by other health conditions. Treatment for fatty liver may involve medicines and lifestyle changes to manage conditions such as: Alcoholism. High cholesterol. Diabetes. Being overweight or obese. Follow these instructions at home:  Do not drink alcohol. If you have trouble quitting, ask your health care provider how to safely quit with the help of medicine or a supervised program. This is important to keep your condition from getting worse. Eat a healthy diet as told by your health care provider. Ask your health care provider about working with a dietitian to develop an eating plan. Exercise regularly. This can help you lose weight and control your cholesterol and diabetes. Talk to your health care provider about an exercise plan and which activities are best for you. Take over-the-counter and prescription medicines only as told by your health care provider. Keep all follow-up visits. This is important. Contact a health care provider if: You have trouble controlling your: Blood sugar. This is especially important if you have diabetes. Cholesterol. Drinking of alcohol. Get help right away if: You have abdominal pain. You have jaundice. You have nausea and are vomiting. You vomit blood or material that looks like coffee grounds. You have stools that are black, tar-like, or bloody. Summary Fatty liver disease develops when too much fat builds up in the cells of your liver. Fatty liver disease often causes no symptoms or problems. However, over time, fatty liver can cause inflammation that may lead to more serious liver problems, such as scarring of the liver (cirrhosis). You are more likely to develop this condition if you abuse alcohol, are pregnant, are overweight, have diabetes, have hepatitis, or have high triglyceride or cholesterol levels. Contact your health care provider if you have trouble controlling your blood sugar, cholesterol,   or drinking of alcohol. This information is  not intended to replace advice given to you by your health care provider. Make sure you discuss any questions you have with your healthcare provider. Document Revised: 09/04/2020 Document Reviewed: 09/04/2020 Elsevier Patient Education  2022 Elsevier Inc.  

## 2021-09-18 ENCOUNTER — Ambulatory Visit (INDEPENDENT_AMBULATORY_CARE_PROVIDER_SITE_OTHER): Payer: Medicaid Other | Admitting: Nurse Practitioner

## 2021-09-18 ENCOUNTER — Encounter: Payer: Self-pay | Admitting: Nurse Practitioner

## 2021-09-18 ENCOUNTER — Other Ambulatory Visit: Payer: Self-pay

## 2021-09-18 VITALS — BP 130/80 | HR 88 | Temp 97.6°F | Resp 16 | Ht 69.0 in | Wt 238.4 lb

## 2021-09-18 DIAGNOSIS — E039 Hypothyroidism, unspecified: Secondary | ICD-10-CM

## 2021-09-18 NOTE — Progress Notes (Signed)
   BP 130/80   Pulse 88   Temp 97.6 F (36.4 C) (Oral)   Resp 16   Ht 5\' 9"  (1.753 m)   Wt 238 lb 6.4 oz (108.1 kg)   SpO2 98%   BMI 35.21 kg/m    Subjective:    Patient ID: , male    DOB: 1966-11-25, 55 y.o.   MRN: [id number]  HPI: Dean Anderson is a 56 y.o. male  Chief Complaint  Patient presents with   Hypothyroidism    Recheck thyroid   Hypothyroidism:  He was seen on  07/20/21 and had labs done.  His TSH was elevated at 5.23.  His levothyroxine dose was increased to 75 mcg daily.  He is here to have his level rechecked.  He denies any palpitations, dizziness, chest pain, shortness of breath, and hot or cold intolerance.  He says he feels great.  Discussed plan of care and he verbalized understanding.    Relevant past medical, surgical, family and social history reviewed and updated as indicated. Interim medical history since our last visit reviewed. Allergies and medications reviewed and updated.  Review of Systems  Constitutional: Negative for fever or weight change.  Respiratory: Negative for cough and shortness of breath.   Cardiovascular: Negative for chest pain or palpitations.  Gastrointestinal: Negative for abdominal pain, no bowel changes.  Musculoskeletal: Negative for gait problem or joint swelling.  Skin: Negative for rash.  Neurological: Negative for dizziness or headache.  No other specific complaints in a complete review of systems (except as listed in HPI above).      Objective:    BP 130/80   Pulse 88   Temp 97.6 F (36.4 C) (Oral)   Resp 16   Ht 5\' 9"  (1.753 m)   Wt 238 lb 6.4 oz (108.1 kg)   SpO2 98%   BMI 35.21 kg/m   Wt Readings from Last 3 Encounters:  09/18/21 238 lb 6.4 oz (108.1 kg)  08/26/21 236 lb 12.8 oz (107.4 kg)  08/19/21 236 lb (107 kg)    Physical Exam  Constitutional: Patient appears well-developed and well-nourished. Obese No distress.  HEENT: head atraumatic, normocephalic, pupils equal and reactive to  light, neck supple Cardiovascular: Normal rate, irregular rhythm and normal heart sounds.  No murmur heard. No BLE edema. Pulmonary/Chest: Effort normal and breath sounds normal. No respiratory distress. Abdominal: Soft.  There is no tenderness. Psychiatric: Patient has a normal mood and affect. behavior is normal. Judgment and thought content normal.   Results for orders placed or performed in visit on 08/19/21  Novel Coronavirus, NAA ([facility])   Specimen: Nasopharyngeal(NP) swabs in vial transport medium   Nasopharynge  Result Value Ref Range   SARS-CoV-2, NAA Detected (A) Not Detected  SARS-COV-2, NAA 2 DAY TAT   Nasopharynge  Result Value Ref Range   SARS-CoV-2, NAA 2 DAY TAT Performed   Specimen status report  Result Value Ref Range   specimen status report Comment       Assessment & Plan:   1. Hypothyroidism, unspecified type  - TSH   Follow up plan: Return in about 6 months (around 03/19/2022) for follow up.

## 2021-09-19 LAB — TSH: TSH: 2.8 mIU/L (ref 0.40–4.50)

## 2021-09-24 ENCOUNTER — Telehealth: Payer: Self-pay | Admitting: *Deleted

## 2021-09-24 ENCOUNTER — Telehealth (INDEPENDENT_AMBULATORY_CARE_PROVIDER_SITE_OTHER): Payer: Medicaid Other | Admitting: Adult Health

## 2021-09-24 DIAGNOSIS — G4733 Obstructive sleep apnea (adult) (pediatric): Secondary | ICD-10-CM | POA: Diagnosis not present

## 2021-09-24 NOTE — Patient Instructions (Addendum)
Keep up the good work Continue on CPAP at bedtime Work on healthy weight loss Do not drive if sleepy Follow-up with Dr. Belia Heman in 6 months [place] office and As needed

## 2021-09-24 NOTE — Telephone Encounter (Signed)
ATC patient x1 to advise him that he does not need to take his Doy Mince machine to the DME company to get a download.  The phone rang and then went to a busy signal.  Will attempt longer.

## 2021-09-24 NOTE — Progress Notes (Signed)
Virtual Visit via Video Note  I connected with Dean Anderson on 09/24/21 at 10:00 AM EDT by a video enabled telemedicine application and verified that I am speaking with the correct person using two identifiers.  Location: Patient: Home  Provider: Office    I discussed the limitations of evaluation and management by telemedicine and the availability of in person appointments. The patient expressed understanding and agreed to proceed.  History of Present Illness: 55 year old male never smoker followed for obstructive sleep apnea Medical history significant for atrial fib, previous pleural effusion  Today's video visit is a 7-month follow-up for sleep apnea.  Patient was seen last year for sleep consult.  He was set up for a sleep study completed in January that showed moderate sleep apnea with AHI at 20/hour.  Patient was recommended to begin nocturnal CPAP.  Patient says he recently got his machine 2 months ago.  Says it is starting to work.  He has decreased daytime sleepiness.  And feels that he benefits from CPAP.  Patient has a Furniture conservator/restorer.  Patient's had 100% compliance with daily average usage at 5.5 hours.  AHI is 1.5.  Patient is on CPAP AutoSet 5 to 15 cm H2O.   Past Medical History:  Diagnosis Date   Acute gastritis without hemorrhage    Anemia    Atrial fibrillation (HCC)    Dyspnea    Family history of adverse reaction to anesthesia    brother woke up in a rage after geting anesthesia for teeth surgery   GERD (gastroesophageal reflux disease)    Hematochezia    History of hiatal hernia    Hypertension    Hypothyroidism    Pleural effusion on right 01/11/2020    Onset of symptoms early 2021 assoc with hbp/afib with neg cards w/u 02/14/20  - Thoracentesis 02/20/2020 >>>   2000 cc  Wbc 267  L > polys  Exudative by protein  with glucose wnl  Cytology >  Still large effusion  Present with loculations > refer to T surgery for ? VATS   Thyroid disease    Current Outpatient  Medications on File Prior to Visit  Medication Sig Dispense Refill   fluticasone (FLONASE) 50 MCG/ACT nasal spray Place 2 sprays into both nostrils daily as needed for allergies or rhinitis. 16 g 5   levothyroxine (SYNTHROID) 75 MCG tablet Take 1 tablet (75 mcg total) by mouth daily before breakfast. 90 tablet 3   metoprolol tartrate (LOPRESSOR) 100 MG tablet Take 1 tablet (100 mg total) by mouth 2 (two) times daily. 180 tablet 3   olmesartan-hydrochlorothiazide (BENICAR HCT) 20-12.5 MG tablet Take 1 tablet by mouth daily. 90 tablet 3   omeprazole (PRILOSEC) 20 MG capsule Take 1 capsule (20 mg total) by mouth at bedtime. 90 capsule 3   No current facility-administered medications on file prior to visit.     Observations/Objective: 12/2020 NPSG showed moderate OSA, AHI 20/hour  Assessment and Plan: Moderate obstructive sleep apnea with excellent control compliance on nocturnal CPAP. Patient education on sleep apnea and CPAP care.  Morbid obesity.  Patient is encouraged on healthy weight loss  Plan  Patient Instructions  Keep up the good work Continue on CPAP at bedtime Work on healthy weight loss Do not drive if sleepy Follow-up with Dr. Belia Heman in 6 months [place] office and As needed      Follow Up Instructions:    I discussed the assessment and treatment plan with the patient. The patient was provided an  opportunity to ask questions and all were answered. The patient agreed with the plan and demonstrated an understanding of the instructions.   The patient was advised to call back or seek an in-person evaluation if the symptoms worsen or if the condition fails to improve as anticipated.  I provided 22 minutes of non-face-to-face time during this encounter.   Rubye Oaks, NP

## 2021-11-05 DIAGNOSIS — G4733 Obstructive sleep apnea (adult) (pediatric): Secondary | ICD-10-CM | POA: Diagnosis not present

## 2021-11-05 DIAGNOSIS — I10 Essential (primary) hypertension: Secondary | ICD-10-CM | POA: Diagnosis not present

## 2021-12-06 DIAGNOSIS — I10 Essential (primary) hypertension: Secondary | ICD-10-CM | POA: Diagnosis not present

## 2021-12-06 DIAGNOSIS — G4733 Obstructive sleep apnea (adult) (pediatric): Secondary | ICD-10-CM | POA: Diagnosis not present

## 2022-01-06 DIAGNOSIS — I10 Essential (primary) hypertension: Secondary | ICD-10-CM | POA: Diagnosis not present

## 2022-01-06 DIAGNOSIS — G4733 Obstructive sleep apnea (adult) (pediatric): Secondary | ICD-10-CM | POA: Diagnosis not present

## 2022-01-09 IMAGING — CR DG CHEST 2V
1 series · 2 of 2 positions shown · non-contrast
Comparison: September 21, 2018

CLINICAL DATA: Shortness of breath with cough and pain

EXAM:
CHEST - 2 VIEW

[Series 1: dg chest 2 view · 0.14mm/px · 2 of 2 slices shown]
[im 1/2]
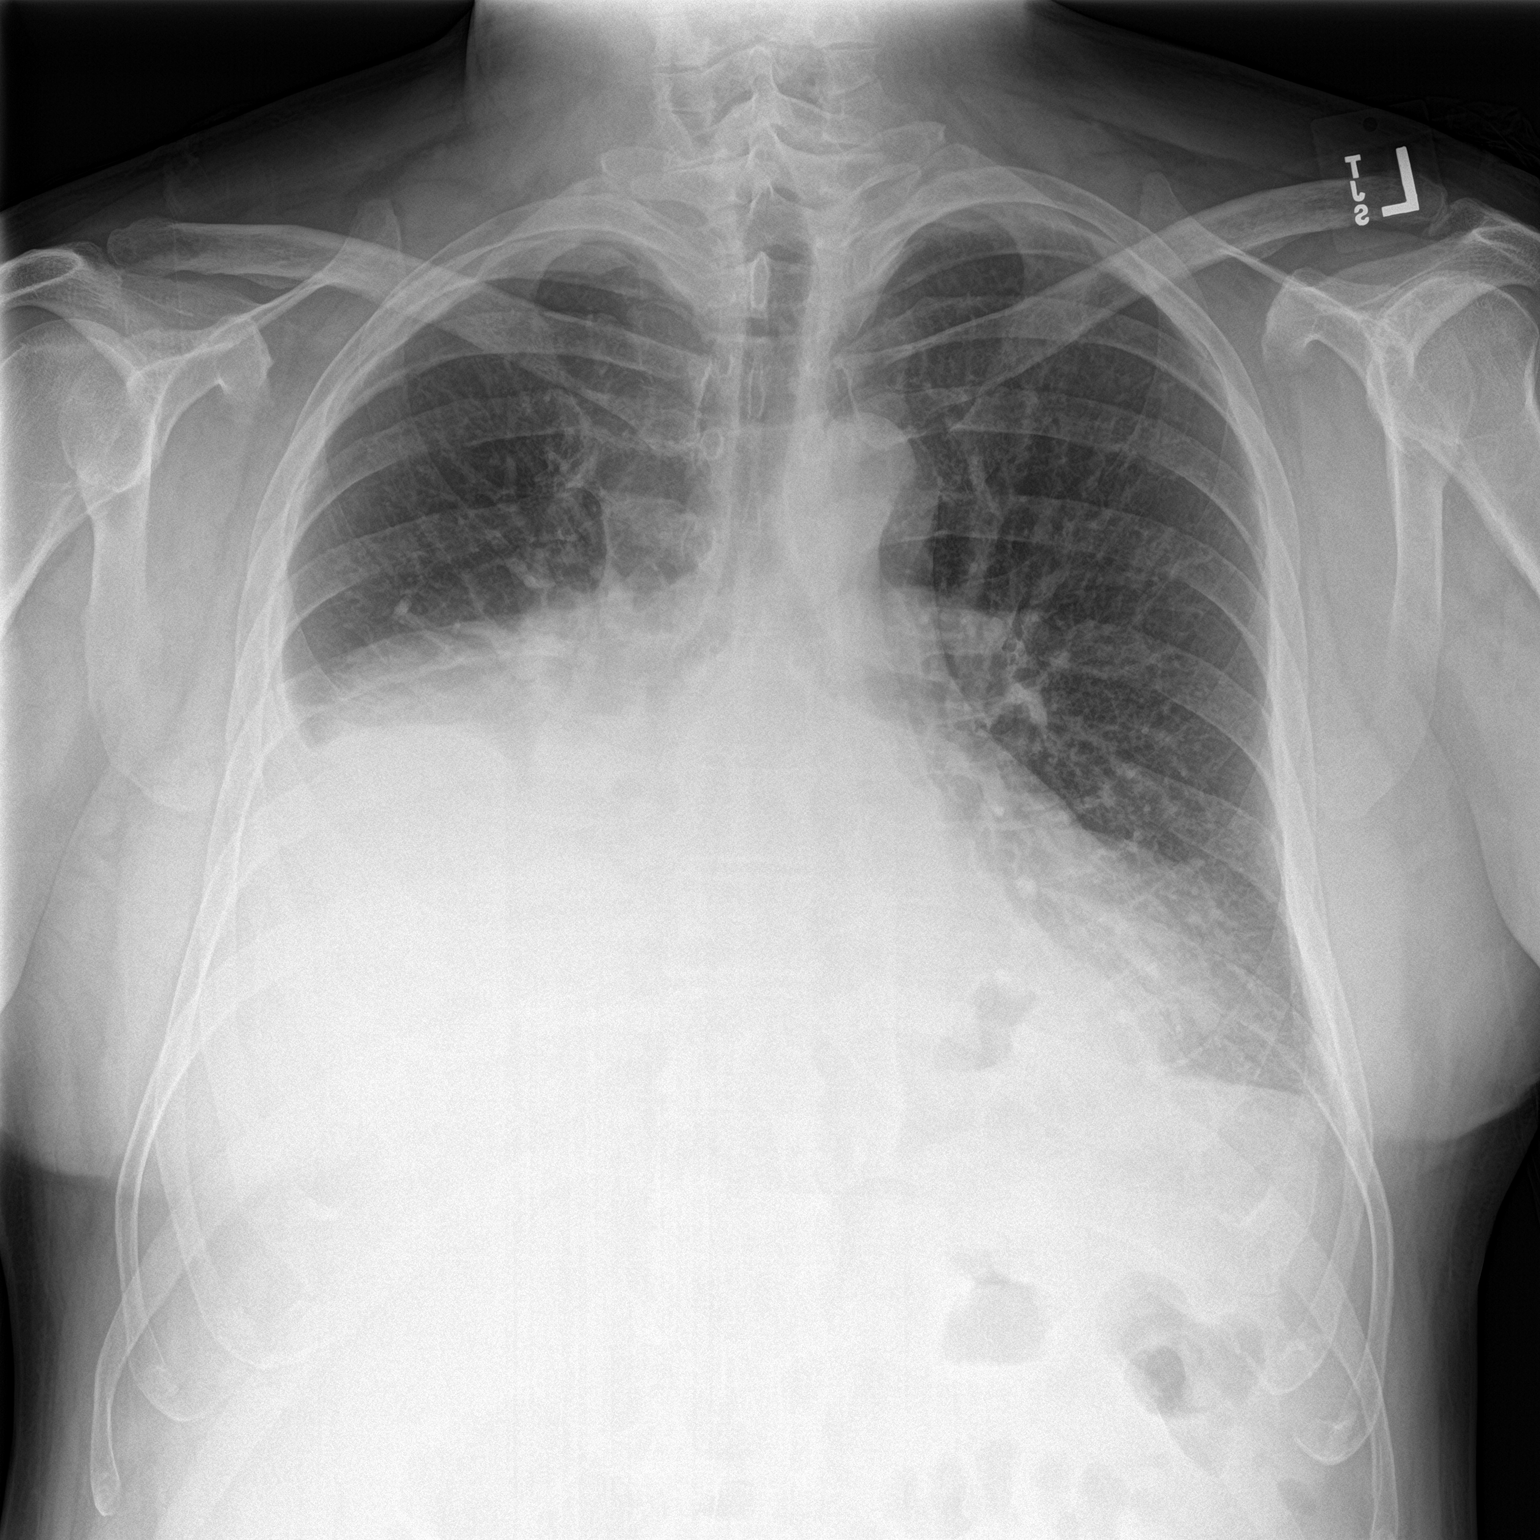
[im 2/2]
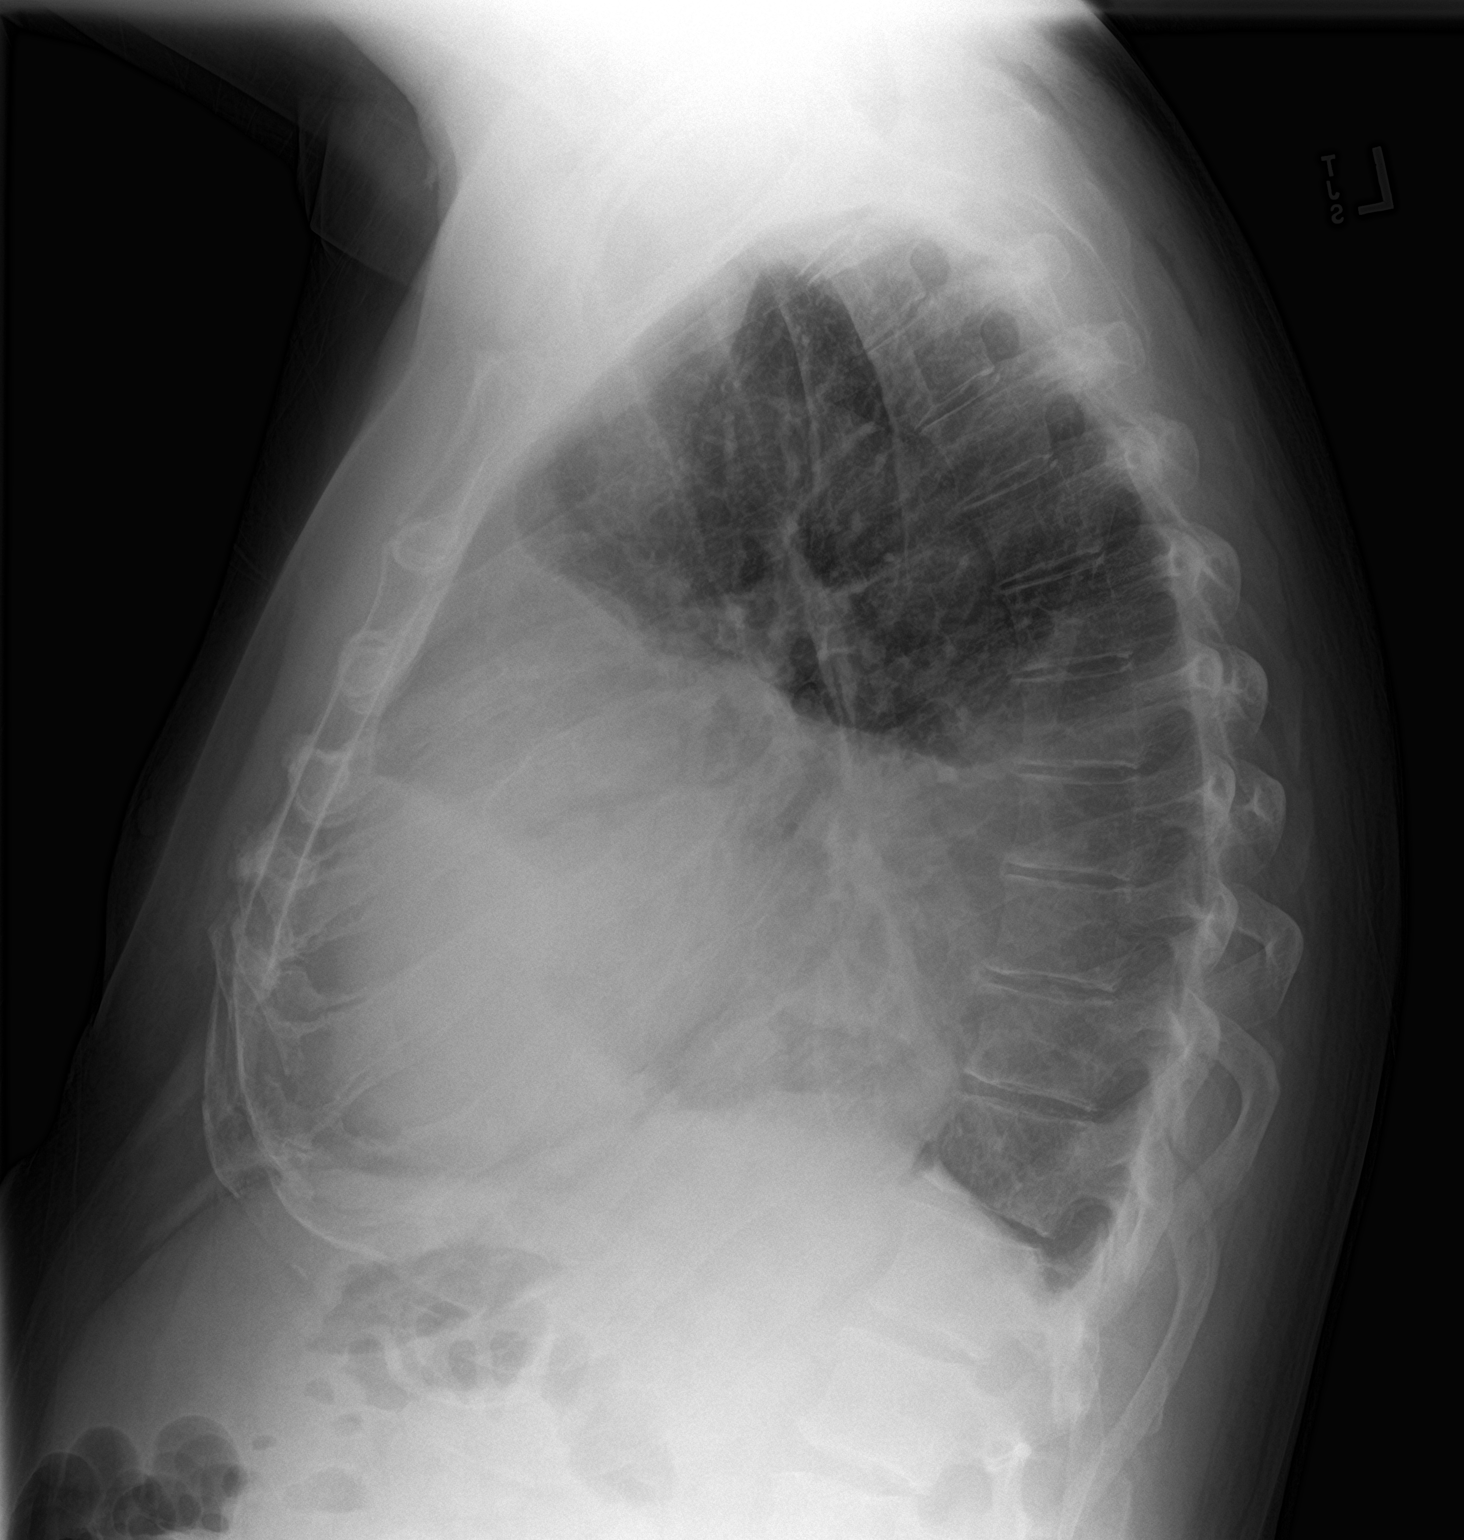

[2 of 2 positions shown; findings below may reference images not displayed]

FINDINGS: There is a sizable right pleural effusion with consolidation
throughout most of the right middle and lower lobes. Left lung is
clear. Heart size and pulmonary vascularity are normal. No
adenopathy evident. There is a focal paraesophageal hernia. No bone
lesions. No pneumothorax.
IMPRESSION: Sizable right pleural effusion with consolidation throughout most of
the right middle and lower lobes. Left lung clear. Cardiac
silhouette normal. No adenopathy. There is a focal paraesophageal
hernia.

These results will be called to the ordering clinician or
representative by the Radiologist Assistant, and communication
documented in the PACS or zVision Dashboard.

## 2022-02-03 DIAGNOSIS — G4733 Obstructive sleep apnea (adult) (pediatric): Secondary | ICD-10-CM | POA: Diagnosis not present

## 2022-02-03 DIAGNOSIS — I10 Essential (primary) hypertension: Secondary | ICD-10-CM | POA: Diagnosis not present

## 2022-02-05 ENCOUNTER — Ambulatory Visit: Payer: Medicaid Other | Admitting: Cardiology

## 2022-02-05 ENCOUNTER — Other Ambulatory Visit: Payer: Self-pay

## 2022-02-05 ENCOUNTER — Encounter: Payer: Self-pay | Admitting: Cardiology

## 2022-02-05 VITALS — BP 130/90 | HR 90 | Ht 68.0 in | Wt 251.0 lb

## 2022-02-05 DIAGNOSIS — I4819 Other persistent atrial fibrillation: Secondary | ICD-10-CM | POA: Diagnosis not present

## 2022-02-05 DIAGNOSIS — I10 Essential (primary) hypertension: Secondary | ICD-10-CM

## 2022-02-05 MED ORDER — OLMESARTAN MEDOXOMIL-HCTZ 20-12.5 MG PO TABS: 1.0000 | ORAL_TABLET | Freq: Every day | ORAL | 3 refills | Status: DC

## 2022-02-05 MED ORDER — OMEPRAZOLE 20 MG PO CPDR: 20.0000 mg | DELAYED_RELEASE_CAPSULE | Freq: Every day | ORAL | 3 refills | Status: DC

## 2022-02-05 MED ORDER — METOPROLOL TARTRATE 100 MG PO TABS: 100.0000 mg | ORAL_TABLET | Freq: Two times a day (BID) | ORAL | 3 refills | Status: DC

## 2022-02-05 NOTE — Patient Instructions (Signed)

## 2022-02-05 NOTE — Progress Notes (Signed)
Cardiology Office Note:    Date:  02/05/2022   ID:  Dean Anderson, DOB 1966-11-09, MRN [id number]  PCP:  Danelle Berryapia, Leisa, PA-C  Cardiologist:  Debbe OdeaBrian Agbor-Etang, MD  Electrophysiologist:  None   Referring MD: Danelle Berryapia, Leisa, PA-C   Chief Complaint  Patient presents with   Other    6 month follow up. Meds reviewed verbally with patient.     History of Present Illness:    Dean Anderson is a 56 y.o. male with a hx of persistent A. Fib (not on anticoagulation due to GI bleeds.), hypertension, OSA who presents for follow-up.  Patient being seen for persistent atrial fibrillation.  Not on anticoagulation due to prior GI bleeds.  Tolerating Lopressor, denies any palpitations, dizziness, syncope.  Uses CPAP mask as prescribed.  Has occasional streaks of blood in his stool when he strains.  Follows up with gastroenterology.  Prior notes Echo 04/2020 showed normal systolic function, EF 60 to 65%, moderate LA dilatation, aortic valve sclerosis. Lexiscan Myoview 02/2020 no evidence for ischemia, low risk scan.  Since diagnosis of A. fib around 2019, he was  put on blood thinners but subsequently developed an anemia.  GI work-up has since been unrevealing only hemorrhoidal bleeding as possible etiology.  He states noticing blood in his stool sometimes.   History of dyspnea status post VATS draining of empyema and decortication, hiatal hernia repair.    Past Medical History:  Diagnosis Date   Acute gastritis without hemorrhage    Anemia    Atrial fibrillation (HCC)    Dyspnea    Family history of adverse reaction to anesthesia    brother woke up in a rage after geting anesthesia for teeth surgery   GERD (gastroesophageal reflux disease)    Hematochezia    History of hiatal hernia    Hypertension    Hypothyroidism    Pleural effusion on right 01/11/2020    Onset of symptoms early 2021 assoc with hbp/afib with neg cards w/u 02/14/20  - Thoracentesis 02/20/2020 >>>   2000 cc  Wbc 267  L > polys   Exudative by protein  with glucose wnl  Cytology >  Still large effusion  Present with loculations > refer to T surgery for ? VATS   Thyroid disease     Past Surgical History:  Procedure Laterality Date   COLONOSCOPY WITH PROPOFOL N/A 04/17/2019   Procedure: COLONOSCOPY WITH PROPOFOL;  Surgeon: Midge MiniumWohl, Darren, MD;  [facility];  Service: Endoscopy;  Laterality: N/A;   ESOPHAGOGASTRODUODENOSCOPY N/A 03/12/2020   Procedure: ESOPHAGOGASTRODUODENOSCOPY (EGD);  Surgeon: Corliss SkainsLightfoot, Harrell O, MD;  [facility];  Service: Thoracic;  Laterality: N/A;   ESOPHAGOGASTRODUODENOSCOPY (EGD) WITH PROPOFOL N/A 04/17/2019   Procedure: ESOPHAGOGASTRODUODENOSCOPY (EGD) WITH PROPOFOL;  Surgeon: Midge MiniumWohl, Darren, MD;  Location: [facility] ENDOSCOPY;  Service: Endoscopy;  Laterality: N/A;   GIVENS CAPSULE STUDY  05/2019   negative   VIDEO ASSISTED THORACOSCOPY (VATS)/DECORTICATION Right 03/12/2020   Procedure: VIDEO ASSISTED THORACOSCOPY (VATS)/DRAINING OF AN EMPYEMA;  Surgeon: Corliss SkainsLightfoot, Harrell O, MD;  Location: [facility] OR;  Service: Thoracic;  Laterality: Right;   XI ROBOTIC ASSISTED HIATAL HERNIA REPAIR N/A 03/12/2020   Procedure: XI ROBOTIC ASSISTED HIATAL HERNIA REPAIR USING ACELL MATRIX HIATAL;  Surgeon: Corliss SkainsLightfoot, Harrell O, MD;  Location: [facility] OR;  Service: Thoracic;  Laterality: N/A;    Current Medications: Current Meds  Medication Sig   fluticasone (FLONASE) 50 MCG/ACT nasal spray Place 2 sprays into both nostrils daily as needed for allergies or rhinitis.  levothyroxine (SYNTHROID) 75 MCG tablet Take 1 tablet (75 mcg total) by mouth daily before breakfast.   [DISCONTINUED] metoprolol tartrate (LOPRESSOR) 100 MG tablet Take 1 tablet (100 mg total) by mouth 2 (two) times daily.   [DISCONTINUED] olmesartan-hydrochlorothiazide (BENICAR HCT) 20-12.5 MG tablet Take 1 tablet by mouth daily.   [DISCONTINUED] omeprazole (PRILOSEC) 20 MG capsule Take 1 capsule (20 mg total) by mouth at bedtime.     Allergies:    Patient has no known allergies.   Social History   Socioeconomic History   Marital status: Married    Spouse name: Burnedette   Number of children: Not on file   Years of education: 12   Highest education level: Some college, no degree  Occupational History   Not on file  Tobacco Use   Smoking status: Never   Smokeless tobacco: Never  Vaping Use   Vaping Use: Never used  Substance and Sexual Activity   Alcohol use: Not Currently    Comment: quit ETOH 03/12/2020   Drug use: Yes    Types: Marijuana    Comment: gummy, last week   Sexual activity: Yes  Other Topics Concern   Not on file  Social History Narrative   alcohol use- cut back April 2021.  Lives with his wife at home.   No lung dx hx, grew up with second hand smoke, wife smokes out of home   Social Determinants of Health   Financial Resource Strain: Not on file  Food Insecurity: Not on file  Transportation Needs: Not on file  Physical Activity: Not on file  Stress: Not on file  Social Connections: Not on file     Family History: The patient's family history includes Heart disease in his brother; Hypertension in his brother and mother; Liver cancer in his maternal uncle; Stroke (age of onset: 62) in his father; Thyroid disease in his mother.  ROS:   Please see the history of present illness.     All other systems reviewed and are negative.  EKGs/Labs/Other Studies Reviewed:    The following studies were reviewed today:   EKG:  EKG is  ordered today.  EKG shows atrial fibrillation, heart rate 90  Recent Labs: 07/20/2021: ALT 16; BUN 15; Creat 1.06; Hemoglobin 13.5; Platelets 236; Potassium 4.8; Sodium 131 09/18/2021: TSH 2.80  Recent Lipid Panel    Component Value Date/Time   CHOL 183 01/19/2021 1205   CHOL 209 (H) 05/21/2020 1649   TRIG 175 (H) 01/19/2021 1205   TRIG 240 (H) 05/21/2020 1649   HDL 55 01/19/2021 1205   CHOLHDL 3.3 01/19/2021 1205   LDLCALC 101 (H) 01/19/2021 1205    Physical Exam:     VS:  BP 130/90 (BP Location: Left Arm, Patient Position: Sitting, Cuff Size: Normal)    Pulse 90    Ht 5\' 8"  (1.727 m)    Wt 251 lb (113.9 kg)    SpO2 98%    BMI 38.16 kg/m     Wt Readings from Last 3 Encounters:  02/05/22 251 lb (113.9 kg)  09/18/21 238 lb 6.4 oz (108.1 kg)  08/26/21 236 lb 12.8 oz (107.4 kg)     GEN:  Well nourished, well developed in no acute distress HEENT: Normal NECK: No JVD; No carotid bruits LYMPHATICS: No lymphadenopathy CARDIAC: Irregular irregular, no murmurs, rubs, gallops RESPIRATORY: Decreased breath sounds at the right lung base, clear anteriorly ABDOMEN: Soft, non-tender, non-distended MUSCULOSKELETAL:  No edema; No deformity  SKIN: Warm and dry NEUROLOGIC:  Alert and oriented x 3 PSYCHIATRIC:  Normal affect   ASSESSMENT:    1. Persistent atrial fibrillation (HCC)   2. Primary hypertension     PLAN:    In order of problems listed above:  Patient with persistent atrial fibrillation.  CHA2DS2-VASc: 1(htn).  Continue Lopressor 100 mg twice daily.  Not on anticoagulation due to GI bleeding and anemia when anticoagulation was tried in the past. GI work-up is so far has shown hemorrhoidal bleeding as only possible etiology.  Consider watchman for higher CHA2DS2-VASc score. hypertension, BP controlled, continue Lopressor, olmesartan 20mg -HCTZ 12.5 mg daily.    Follow-up in 12 months  Total encounter time minutes  Greater than 50% was spent in counseling and coordination of care with the patient Medication management.  This note was generated in part or whole with voice recognition software. Voice recognition is usually quite accurate but there are transcription errors that can and very often do occur. I apologize for any typographical errors that were not detected and corrected.  Medication Adjustments/Labs and Tests Ordered: Current medicines are reviewed at length with the patient today.  Concerns regarding medicines are outlined  above.  Orders Placed This Encounter  Procedures   EKG 12-Lead    Meds ordered this encounter  Medications   olmesartan-hydrochlorothiazide (BENICAR HCT) 20-12.5 MG tablet    Sig: Take 1 tablet by mouth daily.    Dispense:  90 tablet    Refill:  3   metoprolol tartrate (LOPRESSOR) 100 MG tablet    Sig: Take 1 tablet (100 mg total) by mouth 2 (two) times daily.    Dispense:  180 tablet    Refill:  3   omeprazole (PRILOSEC) 20 MG capsule    Sig: Take 1 capsule (20 mg total) by mouth at bedtime.    Dispense:  90 capsule    Refill:  3     Patient Instructions  Medication Instructions:   Your physician recommends that you continue on your current medications as directed. Please refer to the Current Medication list given to you today.  *If you need a refill on your cardiac medications before your next appointment, please call your pharmacy*   Lab Work: None ordered If you have labs (blood work) drawn today and your tests are completely normal, you will receive your results only by: MyChart Message (if you have MyChart) OR A paper copy in the mail If you have any lab test that is abnormal or we need to change your treatment, we will call you to review the results.   Testing/Procedures: None ordered   Follow-Up: At [facility], you and your health needs are our priority.  As part of our continuing mission to provide you with exceptional heart care, we have created designated Provider Care Teams.  These Care Teams include your primary Cardiologist (physician) and Advanced Practice Providers (APPs -  Physician Assistants and Nurse Practitioners) who all work together to provide you with the care you need, when you need it.  We recommend signing up for the patient portal called "MyChart".  Sign up information is provided on this After Visit Summary.  MyChart is used to connect with patients for Virtual Visits (Telemedicine).  Patients are able to view lab/test results, encounter  notes, upcoming appointments, etc.  Non-urgent messages can be sent to your provider as well.   To learn more about what you can do with MyChart, go to [email or web address].    Your next appointment:   1 year(s)  The  format for your next appointment:   In Person  Provider:   You may see Debbe Odea, MD or one of the following Advanced Practice Providers on your designated Care Team:   Nicolasa Ducking, NP Eula Listen, PA-C Cadence Fransico Michael, [place]    Other Instructions     Signed, Debbe Odea, MD  02/05/2022 4:11 PM    [facility]

## 2022-02-10 IMAGING — CR DG CHEST 2V
1 series · 2 of 2 positions shown · non-contrast
Comparison: 01/07/2020

CLINICAL DATA: Right pleural effusion

EXAM:
CHEST - 2 VIEW

[Series 1: dg chest 2 view · 0.14mm/px · 2 of 2 slices shown]
[im 1/2]
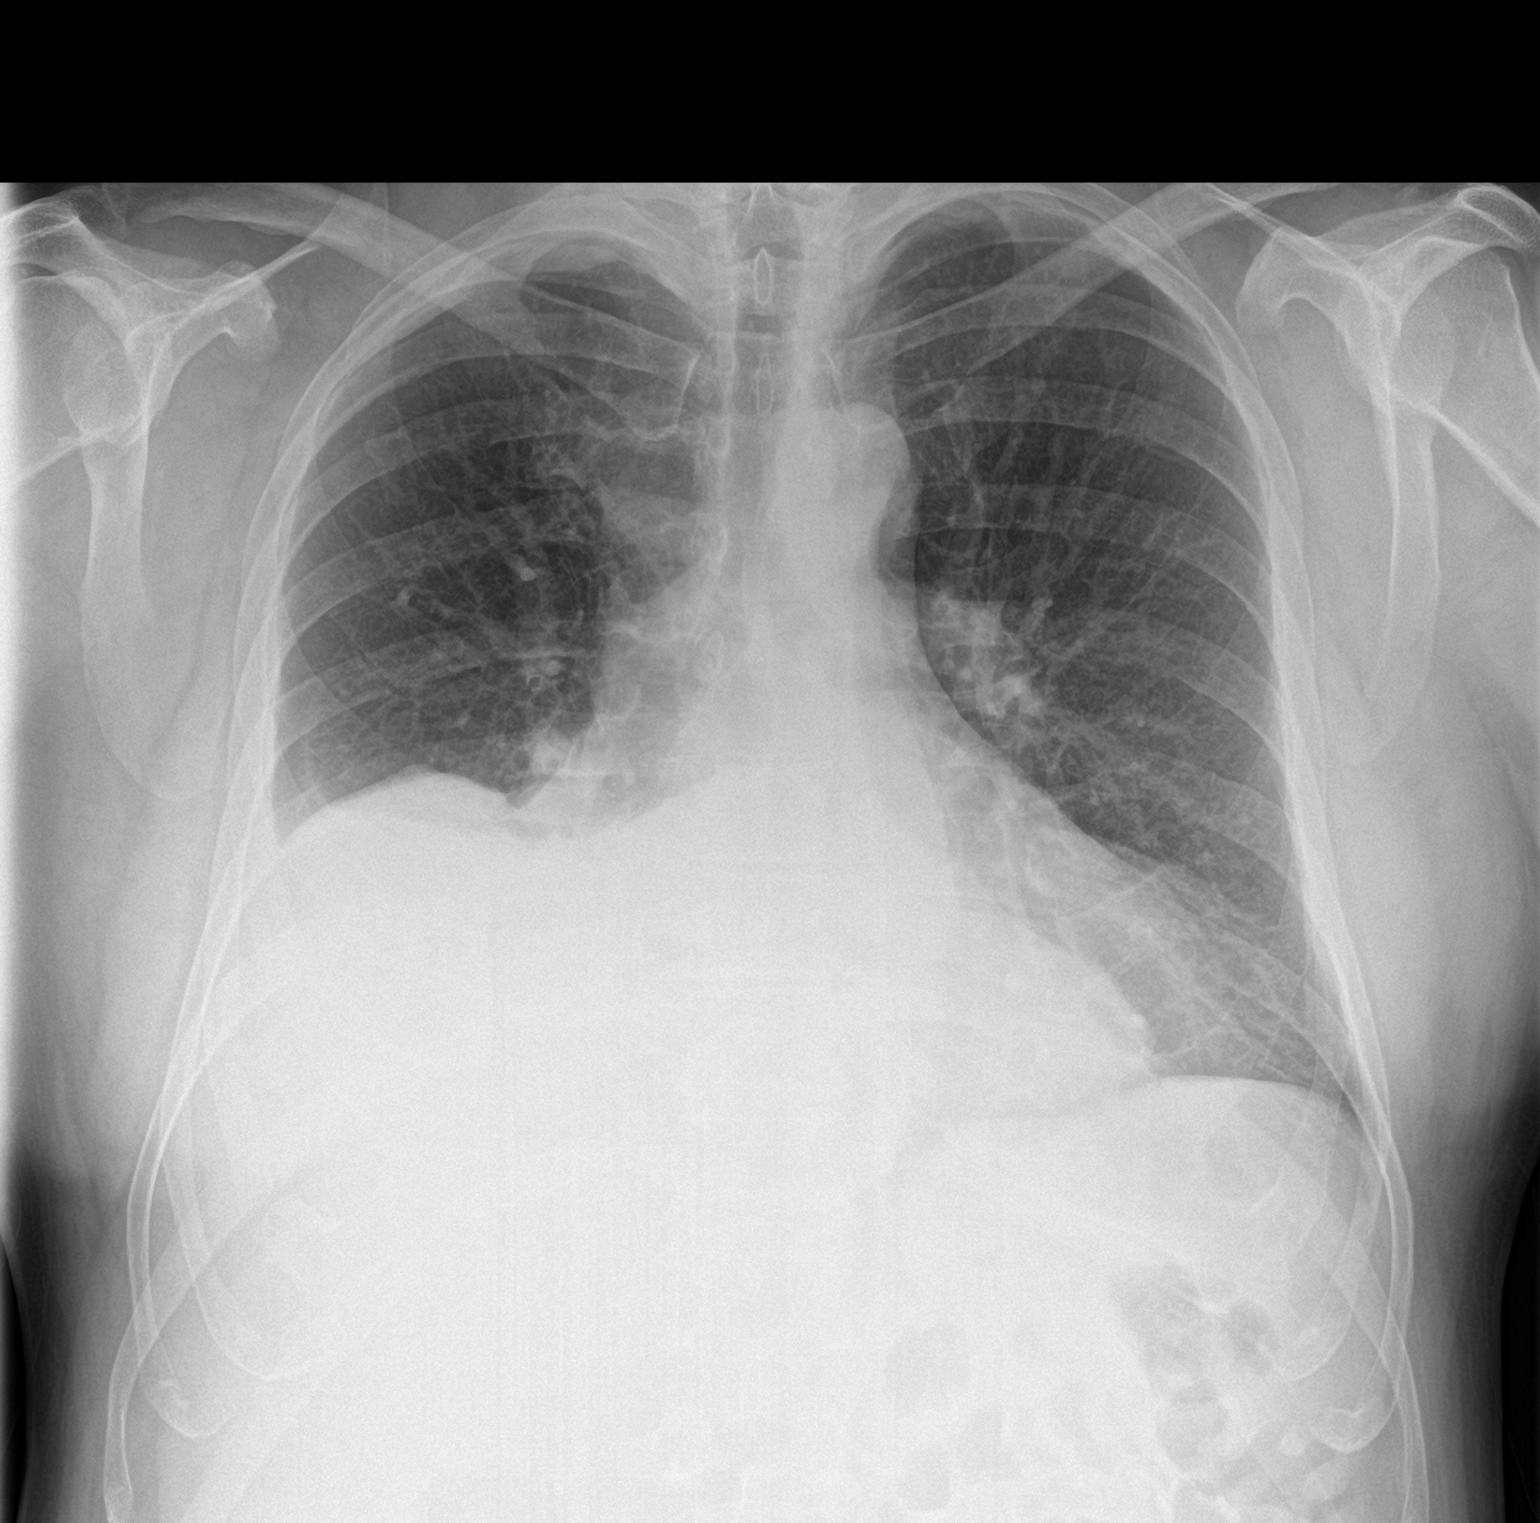
[im 2/2]
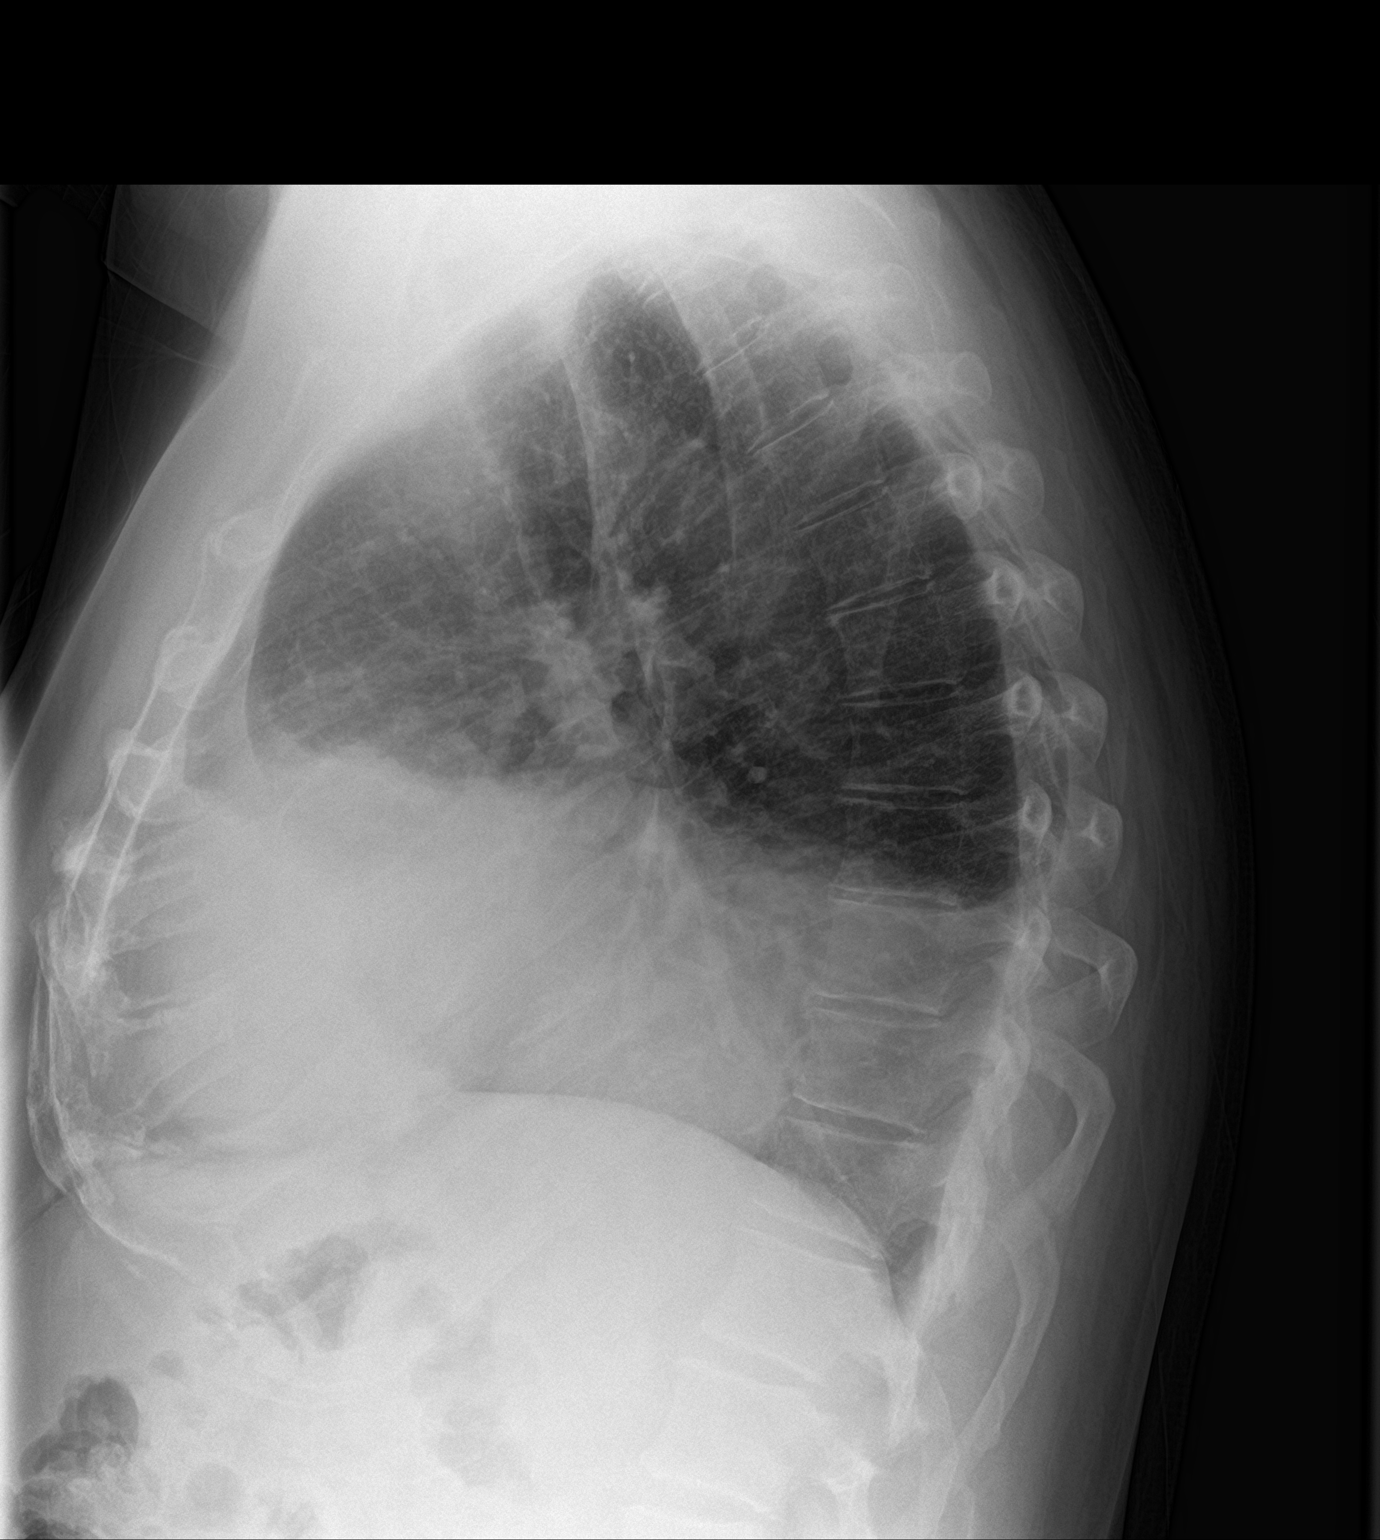

[2 of 2 positions shown; findings below may reference images not displayed]

FINDINGS: Moderate right pleural effusion, unchanged. No frank interstitial
edema. Left lung is clear. No pneumothorax.

The heart is normal in size.

Moderate hiatal hernia.
IMPRESSION: Moderate right pleural effusion, unchanged.

## 2022-03-06 DIAGNOSIS — G4733 Obstructive sleep apnea (adult) (pediatric): Secondary | ICD-10-CM | POA: Diagnosis not present

## 2022-03-06 DIAGNOSIS — I10 Essential (primary) hypertension: Secondary | ICD-10-CM | POA: Diagnosis not present

## 2022-03-19 ENCOUNTER — Ambulatory Visit: Payer: Medicaid Other | Admitting: Family Medicine

## 2022-03-22 ENCOUNTER — Other Ambulatory Visit: Payer: Self-pay

## 2022-03-22 ENCOUNTER — Encounter: Payer: Self-pay | Admitting: Family Medicine

## 2022-03-22 ENCOUNTER — Ambulatory Visit (INDEPENDENT_AMBULATORY_CARE_PROVIDER_SITE_OTHER): Payer: Medicaid Other | Admitting: Family Medicine

## 2022-03-22 VITALS — BP 124/76 | HR 85 | Temp 97.5°F | Resp 16 | Ht 69.0 in | Wt 251.6 lb

## 2022-03-22 DIAGNOSIS — I4819 Other persistent atrial fibrillation: Secondary | ICD-10-CM

## 2022-03-22 DIAGNOSIS — E871 Hypo-osmolality and hyponatremia: Secondary | ICD-10-CM | POA: Diagnosis not present

## 2022-03-22 DIAGNOSIS — I10 Essential (primary) hypertension: Secondary | ICD-10-CM

## 2022-03-22 DIAGNOSIS — K2270 Barrett's esophagus without dysplasia: Secondary | ICD-10-CM | POA: Diagnosis not present

## 2022-03-22 DIAGNOSIS — R0609 Other forms of dyspnea: Secondary | ICD-10-CM | POA: Diagnosis not present

## 2022-03-22 DIAGNOSIS — E039 Hypothyroidism, unspecified: Secondary | ICD-10-CM

## 2022-03-22 DIAGNOSIS — G4733 Obstructive sleep apnea (adult) (pediatric): Secondary | ICD-10-CM | POA: Diagnosis not present

## 2022-03-22 DIAGNOSIS — E785 Hyperlipidemia, unspecified: Secondary | ICD-10-CM

## 2022-03-22 DIAGNOSIS — K760 Fatty (change of) liver, not elsewhere classified: Secondary | ICD-10-CM | POA: Diagnosis not present

## 2022-03-22 MED ORDER — LEVOTHYROXINE SODIUM 75 MCG PO TABS: 75.0000 ug | ORAL_TABLET | Freq: Every day | ORAL | 0 refills | Status: DC

## 2022-03-22 NOTE — Assessment & Plan Note (Signed)
Recheck labs and adjust as indicated.  

## 2022-03-22 NOTE — Assessment & Plan Note (Signed)
Doing well on current regimen, no changes made today. 

## 2022-03-22 NOTE — Assessment & Plan Note (Signed)
Recheck labs 

## 2022-03-22 NOTE — Assessment & Plan Note (Signed)
Compliant with CPAP, continue.  

## 2022-03-22 NOTE — Assessment & Plan Note (Signed)
Continue prilosec. Recommended alcohol cessation. Continue to follow with GI. ?

## 2022-03-22 NOTE — Assessment & Plan Note (Signed)
Counseled on alcohol cessation. Continue to follow with GI. ?

## 2022-03-22 NOTE — Assessment & Plan Note (Signed)
Rate controlled. Continue current management. ?

## 2022-03-22 NOTE — Progress Notes (Signed)
? ?  SUBJECTIVE:  ? ?CHIEF COMPLAINT / HPI:  ? ?Hypertension, OSA, Afib: ?- Medications: olmesartan-HCTZ, metoprolol ?- not on anticoagulation d/t previous GI bleed ?- Compliance: good ?- Checking BP at home: no ?- Denies any CP, medication SEs, or symptoms of hypotension ?- some DOE but improved from before ?- CPAP? yes ? ?Barrett's esophagus - follows with GI, last appt 08/26/21. Taking prilosec. Drinking ~6 12oz cans of beer per day. Doesn't feel the need to cut down. Never had withdrawal symptoms.  ? ?Fatty liver - Fibrosure 05/2020 with steatosis, not consistent with fibrosis.  ? ?H/o recurrent pleural effusions - 2021 s/p thoracentesis and VATS. Still with some DOE, but improved since surgery.  ? ?Hypothyroidism ?- Medications: Synthroid 53mcg ?- Current symptoms:  none ?- Denies change in energy level, diarrhea, nervousness, and weight changes ?- Symptoms have been basically asymptomatic ? ? ?OBJECTIVE:  ? ?BP 124/76   Pulse 85   Temp (!) 97.5 ?F (36.4 ?C) (Oral)   Resp 16   Ht 5\' 9"  (1.753 m)   Wt 251 lb 9.6 oz (114.1 kg)   SpO2 98%   BMI 37.15 kg/m?   ?Gen: well appearing, in NAD ?Card: RRR ?Lungs: CTAB ?Ext: WWP, 1+ pitting edema ? ? ?ASSESSMENT/PLAN:  ? ?Essential hypertension ?Doing well on current regimen, no changes made today. ? ?Persistent atrial fibrillation ([place]) ?Rate controlled. Continue current management. ? ?OSA (obstructive sleep apnea) ?Compliant with CPAP, continue.  ? ?Barrett's esophagus without dysplasia ?Continue prilosec. Recommended alcohol cessation. Continue to follow with GI. ? ?Hepatic steatosis ?Counseled on alcohol cessation. Continue to follow with GI. ? ?Hypothyroidism ?Recheck labs and adjust as indicated.  ? ?Hyperlipidemia ?Recheck labs. ?  ? ? ?Myles Gip, DO ?

## 2022-03-22 NOTE — Patient Instructions (Signed)
It was great to see you! ? ?Our plans for today:  ?- I recommend cutting back on your alcohol intake.  ?- We are checking some labs today, we will release these results to your MyChart. ?- We sent refills to your pharmacy.  ? ?Take care and seek immediate care sooner if you develop any concerns.  ? ?Dr. Linwood Dibbles ? ?

## 2022-03-23 LAB — COMPREHENSIVE METABOLIC PANEL
AG Ratio: 1.4 (calc) (ref 1.0–2.5)
ALT: 14 U/L (ref 9–46)
AST: 15 U/L (ref 10–35)
Albumin: 4.1 g/dL (ref 3.6–5.1)
Alkaline phosphatase (APISO): 36 U/L (ref 35–144)
BUN: 15 mg/dL (ref 7–25)
CO2: 26 mmol/L (ref 20–32)
Calcium: 9.2 mg/dL (ref 8.6–10.3)
Chloride: 96 mmol/L — ABNORMAL LOW (ref 98–110)
Creat: 1.19 mg/dL (ref 0.70–1.30)
Globulin: 3 g/dL (calc) (ref 1.9–3.7)
Glucose, Bld: 96 mg/dL (ref 65–99)
Potassium: 4.6 mmol/L (ref 3.5–5.3)
Sodium: 132 mmol/L — ABNORMAL LOW (ref 135–146)
Total Bilirubin: 1.3 mg/dL — ABNORMAL HIGH (ref 0.2–1.2)
Total Protein: 7.1 g/dL (ref 6.1–8.1)

## 2022-03-23 LAB — LIPID PANEL
Cholesterol: 182 mg/dL (ref <200)
HDL: 50 mg/dL (ref >=40)
LDL Cholesterol (Calc): 110 mg/dL (calc) — ABNORMAL HIGH
Non-HDL Cholesterol (Calc): 132 mg/dL (calc) — ABNORMAL HIGH (ref <130)
Total CHOL/HDL Ratio: 3.6 (calc) (ref <5.0)
Triglycerides: 116 mg/dL (ref <150)

## 2022-03-23 LAB — TSH: TSH: 5.3 mIU/L — ABNORMAL HIGH (ref 0.40–4.50)

## 2022-03-23 MED ORDER — LEVOTHYROXINE SODIUM 100 MCG PO TABS: 100.0000 ug | ORAL_TABLET | Freq: Every day | ORAL | 0 refills | Status: DC

## 2022-03-23 NOTE — Addendum Note (Signed)
Addended by: Myles Gip on: 03/23/2022 10:21 AM ? ? Modules accepted: Orders ? ?

## 2022-03-27 IMAGING — RF DG ESOPHAGUS
8 series · 8 of 8 positions shown · non-contrast
Comparison: None.

CLINICAL DATA: Patient underwent repair of a hiatal hernia on
03/12/2020. Evaluate for repair integrity.

EXAM:
ESOPHOGRAM/BARIUM SWALLOW
TECHNIQUE: Single contrast examination was performed using water-soluble
contrast.
FLUOROSCOPY TIME:  Fluoroscopy Time:  48 seconds
Radiation Exposure Index (if provided by the fluoroscopic device):
42.8 mGy
Number of Acquired Spot Images: 8

[Series 1: fluoro_barium 2fps_bw · 0.17mm/px · 1 of 1 slices shown (1 of 8)]
[im 1/1]
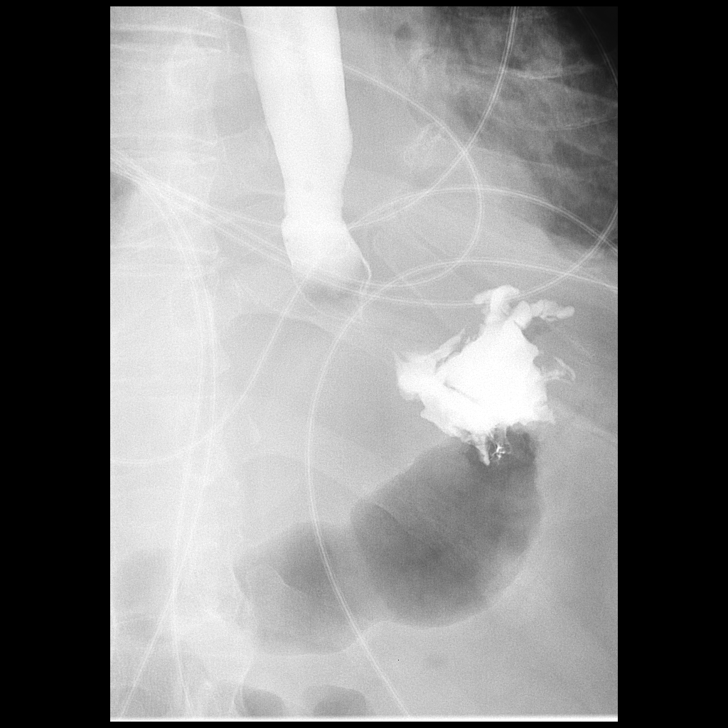

[Series 2: fluoro_barium 2fps_bw · 0.17mm/px · 1 of 1 slices shown (2 of 8)]
[im 1/1]
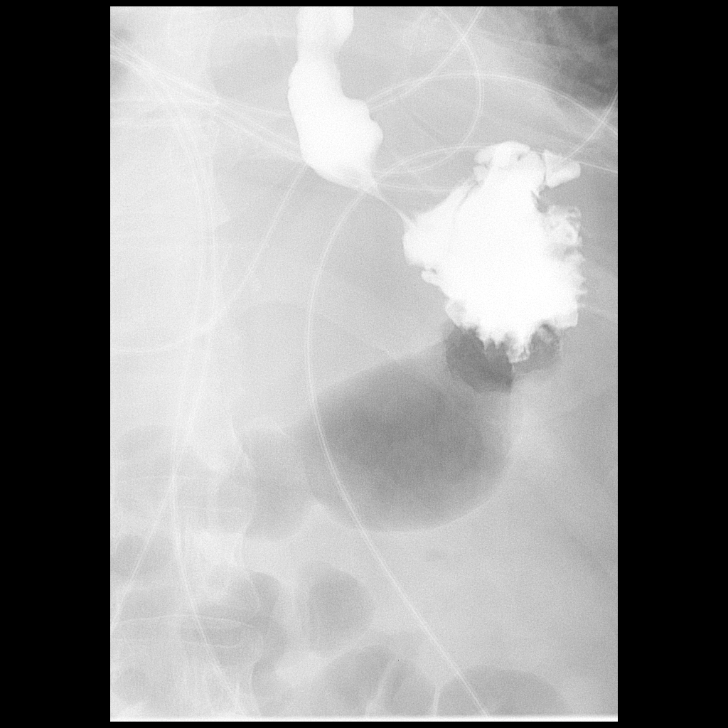

[Series 3: fluoro_barium 2fps_bw · 0.17mm/px · 1 of 1 slices shown (3 of 8)]
[im 1/1]
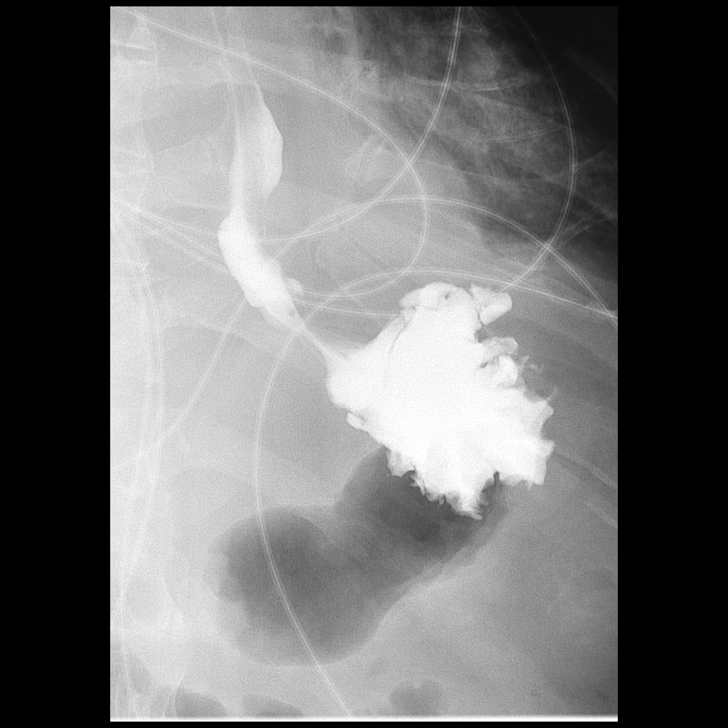

[Series 4: fluoro_barium 2fps_bw · 0.17mm/px · 1 of 1 slices shown (4 of 8)]
[im 1/1]
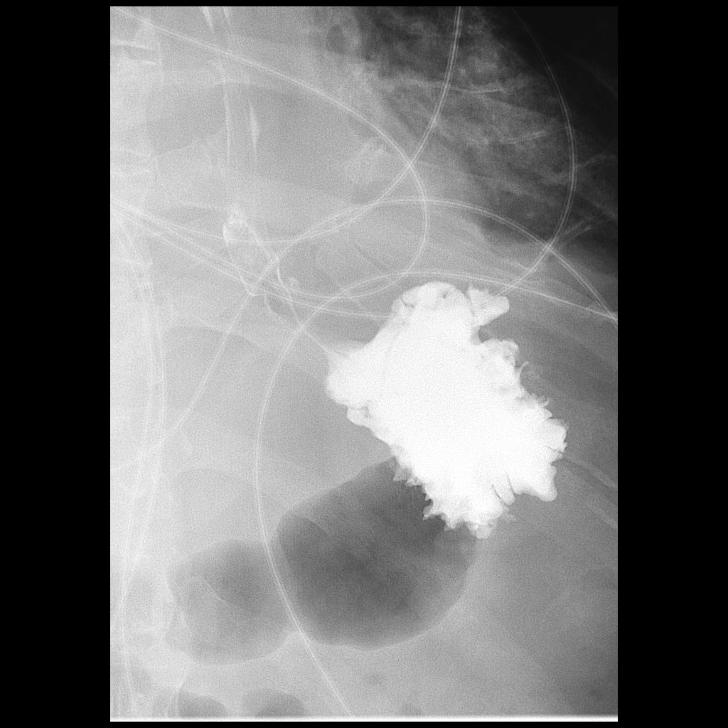

[Series 5: fluoro_barium 2fps_bw · 0.17mm/px · 1 of 1 slices shown (5 of 8)]
[im 1/1]
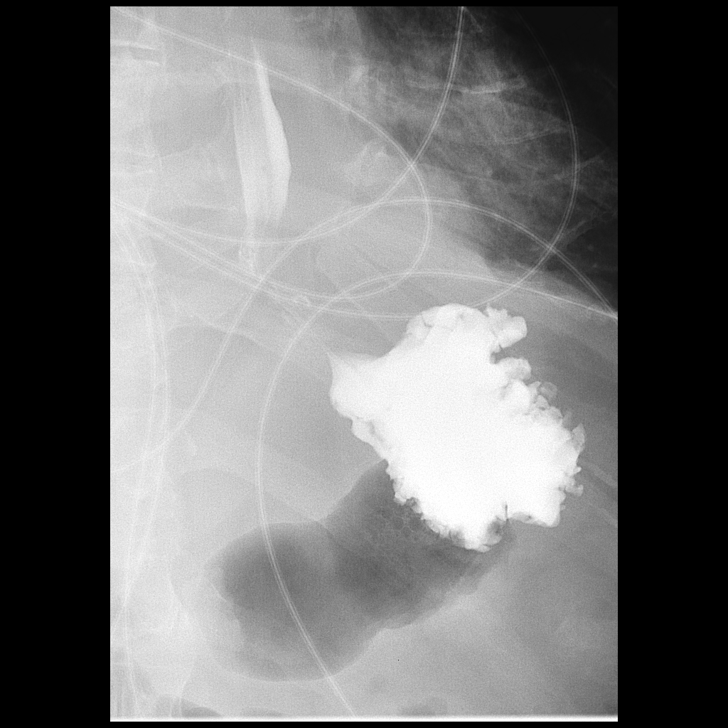

[Series 6: fluoro_barium 2fps_bw · 0.17mm/px · 1 of 1 slices shown (6 of 8)]
[im 1/1]
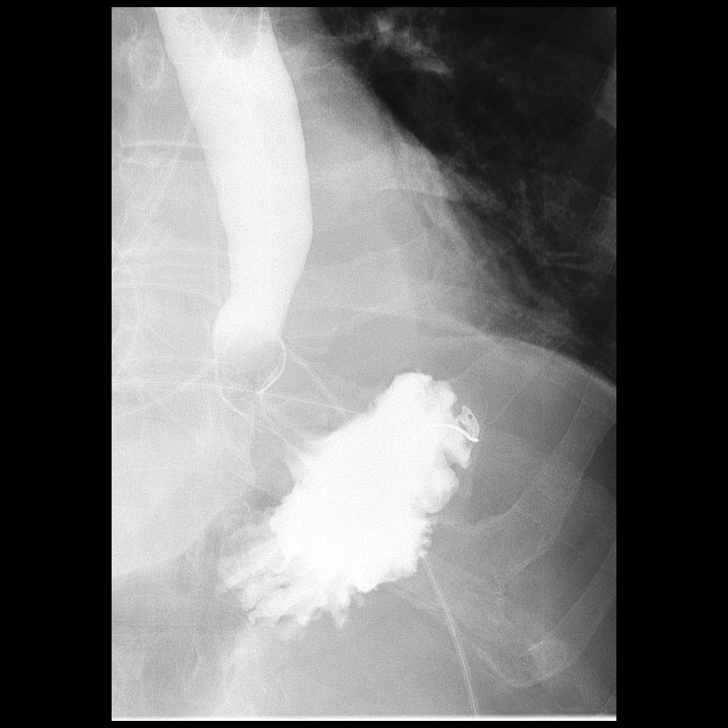

[Series 7: fluoro_barium 2fps_bw · 0.17mm/px · 1 of 1 slices shown (7 of 8)]
[im 1/1]
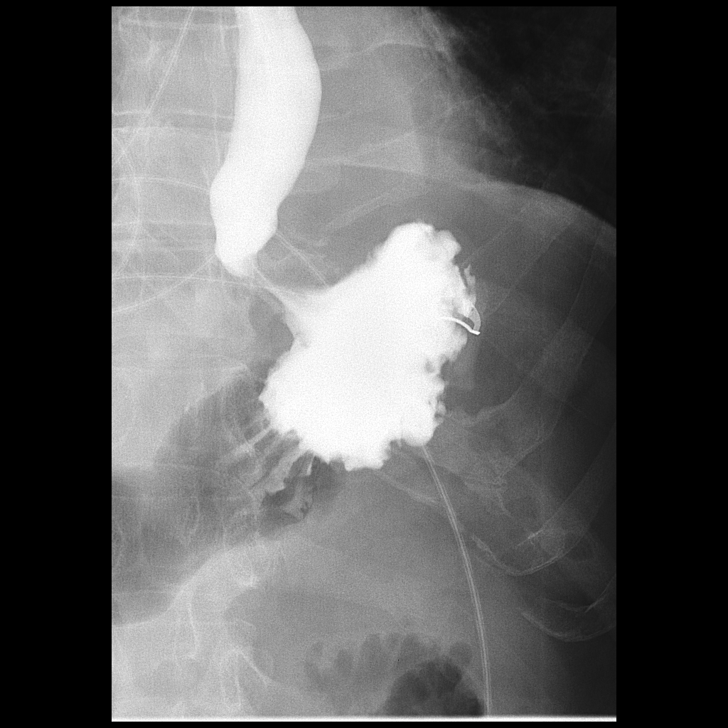

[Series 8: fluoro_barium 2fps_bw · 0.17mm/px · 1 of 1 slices shown (8 of 8)]
[im 1/1]
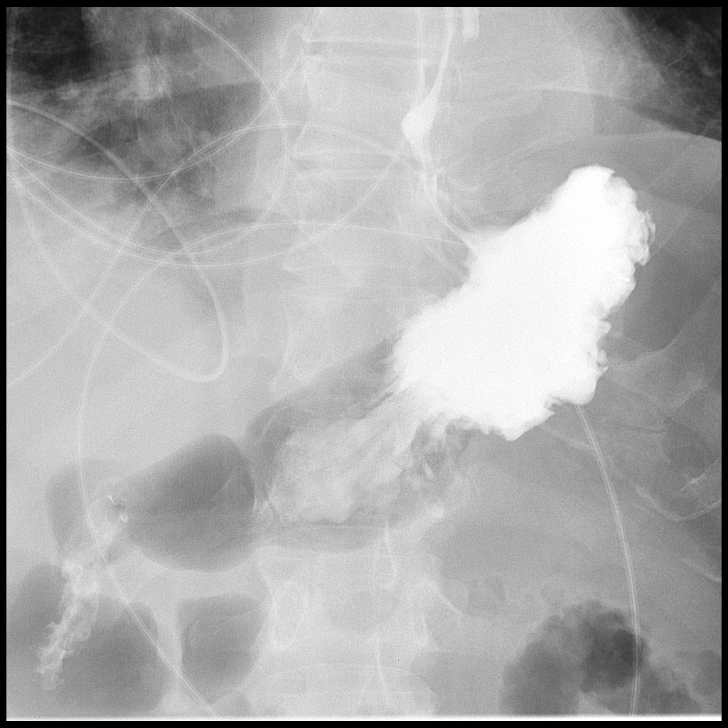

[8 of 8 positions shown; findings below may reference images not displayed]

FINDINGS: Esophagus is normal in course and in caliber. Gastroesophageal
junction is patent with contrast entering the stomach. There is no
extravasation of contrast to indicate a postoperative leak. No
reflux was noted during the study. No hiatal hernia.
IMPRESSION: 1. Repaired hiatal hernia. No evidence of a postoperative leak. No
reflux or significant narrowing at the gastroesophageal junction.

## 2022-04-05 DIAGNOSIS — I10 Essential (primary) hypertension: Secondary | ICD-10-CM | POA: Diagnosis not present

## 2022-04-05 DIAGNOSIS — G4733 Obstructive sleep apnea (adult) (pediatric): Secondary | ICD-10-CM | POA: Diagnosis not present

## 2022-05-06 DIAGNOSIS — G4733 Obstructive sleep apnea (adult) (pediatric): Secondary | ICD-10-CM | POA: Diagnosis not present

## 2022-05-06 DIAGNOSIS — I10 Essential (primary) hypertension: Secondary | ICD-10-CM | POA: Diagnosis not present

## 2022-06-05 DIAGNOSIS — G4733 Obstructive sleep apnea (adult) (pediatric): Secondary | ICD-10-CM | POA: Diagnosis not present

## 2022-06-05 DIAGNOSIS — I10 Essential (primary) hypertension: Secondary | ICD-10-CM | POA: Diagnosis not present

## 2022-07-06 DIAGNOSIS — G4733 Obstructive sleep apnea (adult) (pediatric): Secondary | ICD-10-CM | POA: Diagnosis not present

## 2022-07-06 DIAGNOSIS — I10 Essential (primary) hypertension: Secondary | ICD-10-CM | POA: Diagnosis not present

## 2022-08-06 DIAGNOSIS — I10 Essential (primary) hypertension: Secondary | ICD-10-CM | POA: Diagnosis not present

## 2022-08-06 DIAGNOSIS — G4733 Obstructive sleep apnea (adult) (pediatric): Secondary | ICD-10-CM | POA: Diagnosis not present

## 2022-09-21 ENCOUNTER — Encounter: Payer: Self-pay | Admitting: Family Medicine

## 2022-09-21 ENCOUNTER — Telehealth: Payer: Self-pay

## 2022-09-21 ENCOUNTER — Ambulatory Visit: Payer: Medicaid Other | Admitting: Family Medicine

## 2022-09-21 VITALS — BP 116/76 | HR 74 | Temp 97.7°F | Resp 16 | Ht 69.0 in | Wt 232.5 lb

## 2022-09-21 DIAGNOSIS — E039 Hypothyroidism, unspecified: Secondary | ICD-10-CM | POA: Diagnosis not present

## 2022-09-21 DIAGNOSIS — I4819 Other persistent atrial fibrillation: Secondary | ICD-10-CM | POA: Diagnosis not present

## 2022-09-21 DIAGNOSIS — K760 Fatty (change of) liver, not elsewhere classified: Secondary | ICD-10-CM | POA: Diagnosis not present

## 2022-09-21 DIAGNOSIS — Z6834 Body mass index (BMI) 34.0-34.9, adult: Secondary | ICD-10-CM | POA: Diagnosis not present

## 2022-09-21 DIAGNOSIS — Z23 Encounter for immunization: Secondary | ICD-10-CM

## 2022-09-21 DIAGNOSIS — G4733 Obstructive sleep apnea (adult) (pediatric): Secondary | ICD-10-CM | POA: Diagnosis not present

## 2022-09-21 DIAGNOSIS — E669 Obesity, unspecified: Secondary | ICD-10-CM | POA: Diagnosis not present

## 2022-09-21 DIAGNOSIS — Z8719 Personal history of other diseases of the digestive system: Secondary | ICD-10-CM | POA: Diagnosis not present

## 2022-09-21 DIAGNOSIS — I10 Essential (primary) hypertension: Secondary | ICD-10-CM | POA: Diagnosis not present

## 2022-09-21 DIAGNOSIS — K2270 Barrett's esophagus without dysplasia: Secondary | ICD-10-CM

## 2022-09-21 DIAGNOSIS — Z5181 Encounter for therapeutic drug level monitoring: Secondary | ICD-10-CM

## 2022-09-21 DIAGNOSIS — E785 Hyperlipidemia, unspecified: Secondary | ICD-10-CM

## 2022-09-21 DIAGNOSIS — E871 Hypo-osmolality and hyponatremia: Secondary | ICD-10-CM

## 2022-09-21 MED ORDER — OMEPRAZOLE 20 MG PO CPDR: 20.0000 mg | DELAYED_RELEASE_CAPSULE | Freq: Every day | ORAL | 3 refills | Status: DC

## 2022-09-21 MED ORDER — LEVOTHYROXINE SODIUM 100 MCG PO TABS: 100.0000 ug | ORAL_TABLET | Freq: Every day | ORAL | 0 refills | Status: DC

## 2022-09-21 NOTE — Patient Instructions (Addendum)
Start omeprazole again daily and call [facility] to get the follow up appointment and repeat EGD  You should be taking metoprolol 100 mg two times a day  I will send in a new levothyroxine and we will have to recheck labs in 6-8 weeks - labs are ordered, come the LAST week of November or first week of December

## 2022-09-21 NOTE — Assessment & Plan Note (Signed)
Monitoring LFTs He has lost some weight over the past year, encouraged him to work on healthy diet, avoid processed foods, fatty liver disease may continue to improve with continued weight loss to get to a healthier BMI and with treating his high cholesterol

## 2022-09-21 NOTE — Progress Notes (Signed)
Name: Dean Anderson   MRN: [id number]    DOB: Jul 04, 1966   Date:09/21/2022       Progress Note  Chief Complaint  Patient presents with   Follow-up   Hypertension   Hyperlipidemia   Hypothyroidism     Subjective:   Dean Anderson is a 56 y.o. male, presents to clinic for   Pt now follows with [facility] - Dr. Garen Lah - Pafib, on lopressor Last OV was earlier this year - reviewed: Patient with persistent atrial fibrillation.  CHA2DS2-VASc: 1(htn).  Continue Lopressor 100 mg twice daily.  Not on anticoagulation due to GI bleeding and anemia when anticoagulation was tried in the past. GI work-up is so far has shown hemorrhoidal bleeding as only possible etiology.  Consider watchman for higher CHA2DS2-VASc score. hypertension, BP controlled, continue Lopressor, olmesartan 20mg -HCTZ 12.5 mg daily.  To see cardiology annually  Hypertension:  Currently managed on olmesartan hydrochlorothiazide and metoprolol Pt reports good med compliance and denies any SE.   Blood pressure today is well controlled. BP Readings from Last 3 Encounters:  09/21/22 116/76  03/22/22 124/76  02/05/22 130/90   Pt denies CP, SOB, exertional sx, LE edema, palpitation, Ha's, visual disturbances, lightheadedness, hypotension, syncope. Dietary efforts for BP?  None  HLD - not on meds, seeing cardiology, ASCVD risk score 4.5% Lab Results  Component Value Date   CHOL 182 03/22/2022   HDL 50 03/22/2022   LDLCALC 110 (H) 03/22/2022   TRIG 116 03/22/2022   CHOLHDL 3.6 03/22/2022  The 10-year ASCVD risk score (Arnett DK, et al., 2019) is: 4.5%   Values used to calculate the score:     Age: 54 years     Sex: Male     Is Non-Hispanic African American: No     Diabetic: No     Tobacco smoker: No     Systolic Blood Pressure: 99991111 mmHg     Is BP treated: Yes     HDL Cholesterol: 50 mg/dL     Total Cholesterol: 182 mg/dL  GERD/barrets, following with GI - on long term PPI- last OV sept 2022, Dr.  Bonna Gains left the practice -patient reports not currently taking omeprazole Dean Anderson is a 56 y.o. y/o male here for follow-up of for chronic history of fatty liver and Barrett's esophagus   Barrett's esophagus without dysplasia On low-dose PPI indefinitely as per guidelines 6 cm segment, biopsied in 2020 by Dr. Allen Norris Repeat upper endoscopy due in 2023 Continue acid reflux lifestyle modifications   (Risks of PPI use were discussed with patient including bone loss, C. Diff diarrhea, pneumonia, infections, CKD, electrolyte abnormalities.  Pt. Verbalizes understanding and chooses to continue the medication.)   Fatty liver - previously they thought he had cirrhosis with transient LFT elevation that returned to normal  Fatty liver Due to daily alcohol use, and obesity as well Patient advised to start decreasing alcohol intake, and eventually abstain from alcohol use to prevent disease from worsening into cirrhosis.  Weight loss recommended as well.  Handout for fatty liver given. Most recent ultrasound did not show changes in liver parenchyma, but previous ones have shown this. Most recent CBC and CMP do not show evidence of cirrhosis with normal platelets, and reassuring liver enzymes. He is not immune to hepatitis a and B.  Vaccination was recommended and offered in our clinic today, but pt declined   Dr [name]  OSA on CPAP  -he reports continued good CPAP compliance  He has  lost about 19 pounds in the last 6 months Wt Readings from Last 5 Encounters:  09/21/22 232 lb 8 oz (105.5 kg)  03/22/22 251 lb 9.6 oz (114.1 kg)  02/05/22 251 lb (113.9 kg)  09/18/21 238 lb 6.4 oz (108.1 kg)  08/26/21 236 lb 12.8 oz (107.4 kg)   BMI Readings from Last 5 Encounters:  09/21/22 34.33 kg/m  03/22/22 37.15 kg/m  02/05/22 38.16 kg/m  09/18/21 35.21 kg/m  08/26/21 36.01 kg/m       Current Outpatient Medications:    levothyroxine (SYNTHROID) 100 MCG tablet, Take 1 tablet (100  mcg total) by mouth daily before breakfast., Disp: 60 tablet, Rfl: 0   metoprolol tartrate (LOPRESSOR) 100 MG tablet, Take 1 tablet (100 mg total) by mouth 2 (two) times daily., Disp: 180 tablet, Rfl: 3   olmesartan-hydrochlorothiazide (BENICAR HCT) 20-12.5 MG tablet, Take 1 tablet by mouth daily., Disp: 90 tablet, Rfl: 3   omeprazole (PRILOSEC) 20 MG capsule, Take 1 capsule (20 mg total) by mouth at bedtime., Disp: 90 capsule, Rfl: 3  Patient Active Problem List   Diagnosis Date Noted   OSA (obstructive sleep apnea) 10/28/2020   History of COVID-19 10/28/2020   Hypothyroidism 06/26/2020   Hepatic steatosis 06/26/2020   Hyperlipidemia 06/26/2020   History of repair of hiatal hernia 03/12/2020   Hyponatremia 02/20/2020   Iron deficiency anemia    Barrett's esophagus without dysplasia    Essential hypertension 10/02/2018   DOE (dyspnea on exertion) 10/02/2018   Persistent atrial fibrillation ([place]) 09/21/2018    Past Surgical History:  Procedure Laterality Date   COLONOSCOPY WITH PROPOFOL N/A 04/17/2019   Procedure: COLONOSCOPY WITH PROPOFOL;  Surgeon: Lucilla Lame, MD;  Location: [facility] ENDOSCOPY;  Service: Endoscopy;  Laterality: N/A;   ESOPHAGOGASTRODUODENOSCOPY N/A 03/12/2020   Procedure: ESOPHAGOGASTRODUODENOSCOPY (EGD);  Surgeon: Lajuana Matte, MD;  Location: [facility] OR;  Service: Thoracic;  Laterality: N/A;   ESOPHAGOGASTRODUODENOSCOPY (EGD) WITH PROPOFOL N/A 04/17/2019   Procedure: ESOPHAGOGASTRODUODENOSCOPY (EGD) WITH PROPOFOL;  Surgeon: Lucilla Lame, MD;  Location: [facility] ENDOSCOPY;  Service: Endoscopy;  Laterality: N/A;   GIVENS CAPSULE STUDY  05/2019   negative   VIDEO ASSISTED THORACOSCOPY (VATS)/DECORTICATION Right 03/12/2020   Procedure: VIDEO ASSISTED THORACOSCOPY (VATS)/DRAINING OF AN EMPYEMA;  Surgeon: Lajuana Matte, MD;  Location: [place];  Service: Thoracic;  Laterality: Right;   XI ROBOTIC ASSISTED HIATAL HERNIA REPAIR N/A 03/12/2020   Procedure: XI ROBOTIC ASSISTED  HIATAL HERNIA REPAIR USING ACELL MATRIX HIATAL;  Surgeon: Lajuana Matte, MD;  Location: [place];  Service: Thoracic;  Laterality: N/A;    Family History  Problem Relation Age of Onset   Hypertension Mother    Thyroid disease Mother    Stroke Father 54   Liver cancer Maternal Uncle    Heart disease Brother    Hypertension Brother     Social History   Tobacco Use   Smoking status: Never   Smokeless tobacco: Never  Vaping Use   Vaping Use: Never used  Substance Use Topics   Alcohol use: Not Currently    Comment: quit ETOH 03/12/2020   Drug use: Yes    Types: Marijuana    Comment: gummy, last week     No Known Allergies  Health Maintenance  Topic Date Due   COLONOSCOPY (Pts 45-31yrs Insurance coverage will need to be confirmed)  04/16/2029   Hepatitis C Screening  Completed   HIV Screening  Completed   HPV VACCINES  Aged Out   INFLUENZA VACCINE  Discontinued   TETANUS/TDAP  Discontinued   COVID-19 Vaccine  Discontinued   Zoster Vaccines- Shingrix  Discontinued    Chart Review Today: I personally reviewed active problem list, medication list, allergies, family history, social history, health maintenance, notes from last encounter, lab results, imaging with the patient/caregiver today.   Review of Systems  Constitutional: Negative.   HENT: Negative.    Eyes: Negative.   Respiratory: Negative.    Cardiovascular: Negative.   Gastrointestinal: Negative.   Endocrine: Negative.   Genitourinary: Negative.   Musculoskeletal: Negative.   Skin: Negative.   Allergic/Immunologic: Negative.   Neurological: Negative.   Hematological: Negative.   Psychiatric/Behavioral: Negative.    All other systems reviewed and are negative.    Objective:   Vitals:   09/21/22 1519  BP: 116/76  Pulse: 74  Resp: 16  Temp: 97.7 F (36.5 C)  TempSrc: Oral  SpO2: 98%  Weight: 232 lb 8 oz (105.5 kg)  Height: 5\' 9"  (1.753 m)    Body mass index is 34.33 kg/m.  Physical  Exam Vitals and nursing note reviewed.  Constitutional:      General: He is not in acute distress.    Appearance: Normal appearance. He is well-developed. He is obese. He is not ill-appearing, toxic-appearing or diaphoretic.  HENT:     Head: Normocephalic and atraumatic.     Right Ear: External ear normal.     Left Ear: External ear normal.     Nose: Nose normal.  Eyes:     General: No scleral icterus.       Right eye: No discharge.        Left eye: No discharge.     Conjunctiva/sclera: Conjunctivae normal.  Neck:     Trachea: No tracheal deviation.  Cardiovascular:     Rate and Rhythm: Normal rate. Rhythm irregularly irregular.     Chest Wall: PMI is not displaced.     Pulses: Normal pulses. No decreased pulses.          Carotid pulses are 2+ on the right side and 2+ on the left side.    Heart sounds: No murmur heard.    No gallop.  Pulmonary:     Effort: Pulmonary effort is normal. No respiratory distress.     Breath sounds: Normal breath sounds. No stridor.  Abdominal:     General: Bowel sounds are normal.     Palpations: Abdomen is soft.  Musculoskeletal:     Right lower leg: No edema.     Left lower leg: No edema.  Skin:    General: Skin is warm and dry.     Capillary Refill: Capillary refill takes less than 2 seconds.     Findings: No rash.  Neurological:     Mental Status: He is alert.     Motor: No abnormal muscle tone.     Coordination: Coordination normal.  Psychiatric:        Mood and Affect: Mood normal.        Behavior: Behavior normal.         Assessment & Plan:   Problem List Items Addressed This Visit       Cardiovascular and Mediastinum   Persistent atrial fibrillation ([place])    Managed by cardiology on Lopressor 100 mg twice daily but he has only been taking it once a day Blood pressure is well controlled No palpitations, dyspnea, near syncope He is not anticoagulated due to history of GI bleed which occurred in the setting  of starting  anticoagulation      Relevant Medications   omeprazole (PRILOSEC) 20 MG capsule   Essential hypertension - Primary    Blood pressure well controlled and at goal today on olmesartan hydrochlorothiazide and Lopressor      Relevant Orders   COMPLETE METABOLIC PANEL WITH GFR     Respiratory   OSA (obstructive sleep apnea)    Patient reports good CPAP compliance        Digestive   Barrett's esophagus without dysplasia    overdue for 2023 GI f/up, pt stopped PPI, today I reviewed his prior EGD, Barrett's esophagus and reviewed how he is supposed to continue his PPI long-term and follow-up with [facility] this year for repeat EGD He was lost to follow-up with Dr. Bonna Gains leaving the practice He denies any dysphagia abdominal pain, reflux symptoms, melena hematochezia      Relevant Orders   Ambulatory referral to Gastroenterology   Hepatic steatosis    Monitoring LFTs He has lost some weight over the past year, encouraged him to work on healthy diet, avoid processed foods, fatty liver disease may continue to improve with continued weight loss to get to a healthier BMI and with treating his high cholesterol      Relevant Orders   Ambulatory referral to Gastroenterology     Endocrine   Hypothyroidism    In April the medication dose was increased due to elevated TSH, he was given 55-month supply and instructed to come back to recheck labs but he did not. We called his pharmacy and he did get a refill somehow in June but I do not know how much he got is likely been off his levothyroxine and his labs will be abnormal, we will restart the same dose and recheck his TSH again in 6 to 8 weeks      Relevant Medications   levothyroxine (SYNTHROID) 100 MCG tablet   Other Relevant Orders   TSH   TSH     Other   Hyperlipidemia    High cholesterol, not currently requiring statin, last ASCVD risk percent was 4.5      Relevant Orders   COMPLETE METABOLIC PANEL WITH GFR   Lipid panel    Class 1 obesity with serious comorbidity and body mass index (BMI) of 34.0 to 34.9 in adult   Other Visit Diagnoses     Need for influenza vaccination       Refused   Need for shingles vaccine       Discussed, sent to his pharmacy   Need for Tdap vaccination       Refused   Encounter for medication monitoring       Relevant Orders   COMPLETE METABOLIC PANEL WITH GFR   Lipid panel   TSH   CBC with Differential/Platelet      Pt to come back in 6-8 weeks for recheck TS    Return in about 6 months (around 03/23/2023) for Routine follow-up.   Delsa Grana, PA-C 09/21/22 3:28 PM

## 2022-09-21 NOTE — Assessment & Plan Note (Signed)
Patient reports good CPAP compliance

## 2022-09-21 NOTE — Assessment & Plan Note (Signed)
Blood pressure well controlled and at goal today on olmesartan hydrochlorothiazide and Lopressor

## 2022-09-21 NOTE — Telephone Encounter (Signed)
Called [facility] no answer had to leave voicemail. Per Kristeen Miss- call [facility] - he was supposed to see them again this year and have repeat EGD - his doc left - can you call and ask them who he is supposed to be seeing and make sure someone reaches out to the pt so he can be scheduled for his needed EGD f/up.

## 2022-09-21 NOTE — Assessment & Plan Note (Signed)
Managed by cardiology on Lopressor 100 mg twice daily but he has only been taking it once a day Blood pressure is well controlled No palpitations, dyspnea, near syncope He is not anticoagulated due to history of GI bleed which occurred in the setting of starting anticoagulation

## 2022-09-21 NOTE — Assessment & Plan Note (Signed)
overdue for 2023 GI f/up, pt stopped PPI, today I reviewed his prior EGD, Barrett's esophagus and reviewed how he is supposed to continue his PPI long-term and follow-up with [facility] this year for repeat EGD He was lost to follow-up with Dr. Bonna Gains leaving the practice He denies any dysphagia abdominal pain, reflux symptoms, melena hematochezia

## 2022-09-21 NOTE — Assessment & Plan Note (Signed)
In April the medication dose was increased due to elevated TSH, he was given 66-month supply and instructed to come back to recheck labs but he did not. We called his pharmacy and he did get a refill somehow in June but I do not know how much he got is likely been off his levothyroxine and his labs will be abnormal, we will restart the same dose and recheck his TSH again in 6 to 8 weeks

## 2022-09-21 NOTE — Assessment & Plan Note (Signed)
High cholesterol, not currently requiring statin, last ASCVD risk percent was 4.5

## 2022-09-22 LAB — CBC WITH DIFFERENTIAL/PLATELET
Absolute Monocytes: 755 cells/uL (ref 200–950)
Basophils Absolute: 51 cells/uL (ref 0–200)
Basophils Relative: 0.8 %
Eosinophils Absolute: 282 cells/uL (ref 15–500)
Eosinophils Relative: 4.4 %
HCT: 42.4 % (ref 38.5–50.0)
Hemoglobin: 14.7 g/dL (ref 13.2–17.1)
Lymphs Abs: 851 cells/uL (ref 850–3900)
MCH: 31.5 pg (ref 27.0–33.0)
MCHC: 34.7 g/dL (ref 32.0–36.0)
MCV: 91 fL (ref 80.0–100.0)
MPV: 9.7 fL (ref 7.5–12.5)
Monocytes Relative: 11.8 %
Neutro Abs: 4461 cells/uL (ref 1500–7800)
Neutrophils Relative %: 69.7 %
Platelets: 224 10*3/uL (ref 140–400)
RBC: 4.66 10*6/uL (ref 4.20–5.80)
RDW: 13.1 % (ref 11.0–15.0)
Total Lymphocyte: 13.3 %
WBC: 6.4 10*3/uL (ref 3.8–10.8)

## 2022-09-22 LAB — COMPLETE METABOLIC PANEL WITH GFR
AG Ratio: 1.5 (calc) (ref 1.0–2.5)
ALT: 17 U/L (ref 9–46)
AST: 15 U/L (ref 10–35)
Albumin: 4.1 g/dL (ref 3.6–5.1)
Alkaline phosphatase (APISO): 49 U/L (ref 35–144)
BUN: 12 mg/dL (ref 7–25)
CO2: 24 mmol/L (ref 20–32)
Calcium: 9.1 mg/dL (ref 8.6–10.3)
Chloride: 94 mmol/L — ABNORMAL LOW (ref 98–110)
Creat: 0.98 mg/dL (ref 0.70–1.30)
Globulin: 2.7 g/dL (calc) (ref 1.9–3.7)
Glucose, Bld: 85 mg/dL (ref 65–99)
Potassium: 4.3 mmol/L (ref 3.5–5.3)
Sodium: 129 mmol/L — ABNORMAL LOW (ref 135–146)
Total Bilirubin: 1 mg/dL (ref 0.2–1.2)
Total Protein: 6.8 g/dL (ref 6.1–8.1)
eGFR: 92 mL/min/{1.73_m2} (ref >=60)

## 2022-09-22 LAB — LIPID PANEL
Cholesterol: 179 mg/dL (ref <200)
HDL: 52 mg/dL (ref >=40)
LDL Cholesterol (Calc): 104 mg/dL (calc) — ABNORMAL HIGH
Non-HDL Cholesterol (Calc): 127 mg/dL (calc) (ref <130)
Total CHOL/HDL Ratio: 3.4 (calc) (ref <5.0)
Triglycerides: 136 mg/dL (ref <150)

## 2022-09-22 LAB — TSH: TSH: 7.13 mIU/L — ABNORMAL HIGH (ref 0.40–4.50)

## 2022-09-23 NOTE — Addendum Note (Signed)
Addended by: Delsa Grana on: 09/23/2022 09:09 AM   Modules accepted: Orders

## 2022-11-24 DIAGNOSIS — E871 Hypo-osmolality and hyponatremia: Secondary | ICD-10-CM | POA: Diagnosis not present

## 2022-11-25 LAB — COMPLETE METABOLIC PANEL WITH GFR
AG Ratio: 1.6 (calc) (ref 1.0–2.5)
ALT: 18 U/L (ref 9–46)
AST: 15 U/L (ref 10–35)
Albumin: 4.4 g/dL (ref 3.6–5.1)
Alkaline phosphatase (APISO): 48 U/L (ref 35–144)
BUN: 14 mg/dL (ref 7–25)
CO2: 27 mmol/L (ref 20–32)
Calcium: 9 mg/dL (ref 8.6–10.3)
Chloride: 96 mmol/L — ABNORMAL LOW (ref 98–110)
Creat: 0.99 mg/dL (ref 0.70–1.30)
Globulin: 2.8 g/dL (calc) (ref 1.9–3.7)
Glucose, Bld: 97 mg/dL (ref 65–99)
Potassium: 4.5 mmol/L (ref 3.5–5.3)
Sodium: 132 mmol/L — ABNORMAL LOW (ref 135–146)
Total Bilirubin: 1.3 mg/dL — ABNORMAL HIGH (ref 0.2–1.2)
Total Protein: 7.2 g/dL (ref 6.1–8.1)
eGFR: 91 mL/min/{1.73_m2} (ref >=60)

## 2022-11-30 ENCOUNTER — Other Ambulatory Visit: Payer: Self-pay

## 2022-11-30 ENCOUNTER — Other Ambulatory Visit: Payer: Self-pay | Admitting: Family Medicine

## 2022-11-30 DIAGNOSIS — E039 Hypothyroidism, unspecified: Secondary | ICD-10-CM

## 2022-11-30 MED ORDER — LEVOTHYROXINE SODIUM 100 MCG PO TABS: 100.0000 ug | ORAL_TABLET | Freq: Every day | ORAL | 0 refills | Status: DC

## 2022-12-01 LAB — TSH: TSH: 3.7 mIU/L (ref 0.40–4.50)

## 2022-12-16 DIAGNOSIS — G4733 Obstructive sleep apnea (adult) (pediatric): Secondary | ICD-10-CM | POA: Diagnosis not present

## 2022-12-16 DIAGNOSIS — I10 Essential (primary) hypertension: Secondary | ICD-10-CM | POA: Diagnosis not present

## 2023-01-20 ENCOUNTER — Encounter: Payer: Self-pay | Admitting: Gastroenterology

## 2023-01-20 ENCOUNTER — Ambulatory Visit: Payer: Medicaid Other | Admitting: Gastroenterology

## 2023-01-20 NOTE — Progress Notes (Deleted)
    Jonathon Bellows MD, MRCP(U.K) [address]  [place]  [place], [place] [address]  Main: [phone]  Fax: [phone]   Primary Care Physician: Delsa Grana, PA-C  Primary Gastroenterologist:  Dr. Jonathon Bellows   No chief complaint on file.   HPI: Dean Anderson is a 57 y.o. male   Summary of history :  He is a patient who used to see Dr. Bonna Gains last 09/13/2021.  He has been on Prilosec for many years.  History of hiatal hernia repair in 2021.  Previous history of alcohol abuse in 2020 had a 6 cm long segment Barrett's plan was to repeat endoscopy in 2023  Interval history   08/26/2021-01/20/2023  11/30/2022: TSH normal, hemoglobin 14.7 CMP showed a normal albumin AST ALT.  ***   Current Outpatient Medications  Medication Sig Dispense Refill   levothyroxine (SYNTHROID) 100 MCG tablet Take 1 tablet (100 mcg total) by mouth daily before breakfast. 30 tablet 0   metoprolol tartrate (LOPRESSOR) 100 MG tablet Take 1 tablet (100 mg total) by mouth 2 (two) times daily. 180 tablet 3   olmesartan-hydrochlorothiazide (BENICAR HCT) 20-12.5 MG tablet Take 1 tablet by mouth daily. 90 tablet 3   omeprazole (PRILOSEC) 20 MG capsule Take 1 capsule (20 mg total) by mouth at bedtime. 90 capsule 3   No current facility-administered medications for this visit.    Allergies as of 01/20/2023   (No Known Allergies)    ROS:  General: Negative for anorexia, weight loss, fever, chills, fatigue, weakness. ENT: Negative for hoarseness, difficulty swallowing , nasal congestion. CV: Negative for chest pain, angina, palpitations, dyspnea on exertion, peripheral edema.  Respiratory: Negative for dyspnea at rest, dyspnea on exertion, cough, sputum, wheezing.  GI: See history of present illness. GU:  Negative for dysuria, hematuria, urinary incontinence, urinary frequency, nocturnal urination.  Endo: Negative for unusual weight change.    Physical Examination:   There were no vitals taken for  this visit.  General: Well-nourished, well-developed in no acute distress.  Eyes: No icterus. Conjunctivae pink. Mouth: Oropharyngeal mucosa moist and pink , no lesions erythema or exudate. Lungs: Clear to auscultation bilaterally. Non-labored. Heart: Regular rate and rhythm, no murmurs rubs or gallops.  Abdomen: Bowel sounds are normal, nontender, nondistended, no hepatosplenomegaly or masses, no abdominal bruits or hernia , no rebound or guarding.   Extremities: No lower extremity edema. No clubbing or deformities. Neuro: Alert and oriented x 3.  Grossly intact. Skin: Warm and dry, no jaundice.   Psych: Alert and cooperative, normal mood and affect.   Imaging Studies: No results found.  Assessment and Plan:   Dean Anderson is a 57 y.o. y/o male with a history of hiatal hernia that was repaired a few years back 6 cm long segment Barrett's esophagus evaluated in 2020 due for surveillance endoscopy on 20 mg of omeprazole once a day long-term.  Plan 1.  EGD to evaluate for dysplasia in the setting of Barrett's esophagus 2.  Continue PPI as it is required to prevent the transformation of Barrett's esophagus and esophageal cancer he is aware of the risks including but not limited to thinning of bones low magnesium, increased risk of infection possible kidney disease, dementia.  But balancing the risks versus benefits the probably the benefits exceeds the risks and hence would recommend to continue    Dr Jonathon Bellows  MD,MRCP [facility]) Follow up in 1 year  BP check ***

## 2023-02-14 ENCOUNTER — Other Ambulatory Visit: Payer: Self-pay | Admitting: Family Medicine

## 2023-02-14 DIAGNOSIS — I10 Essential (primary) hypertension: Secondary | ICD-10-CM

## 2023-02-14 DIAGNOSIS — E039 Hypothyroidism, unspecified: Secondary | ICD-10-CM

## 2023-02-14 MED ORDER — OLMESARTAN MEDOXOMIL-HCTZ 20-12.5 MG PO TABS: 1.0000 | ORAL_TABLET | Freq: Every day | ORAL | 0 refills | Status: DC

## 2023-02-14 MED ORDER — METOPROLOL TARTRATE 100 MG PO TABS: 100.0000 mg | ORAL_TABLET | Freq: Two times a day (BID) | ORAL | 0 refills | Status: DC

## 2023-02-14 MED ORDER — LEVOTHYROXINE SODIUM 100 MCG PO TABS: 100.0000 ug | ORAL_TABLET | Freq: Every day | ORAL | 2 refills | Status: DC

## 2023-02-14 NOTE — Telephone Encounter (Signed)
Requested medication (s) are due for refill today: Yes  Requested medication (s) are on the active medication list: Yes  Last refill:  02/05/22  Future visit scheduled: Yes  Notes to clinic:  Unable to refill per protocol, last refill by another provider. Patient is out of meds     Requested Prescriptions  Pending Prescriptions Disp Refills   metoprolol tartrate (LOPRESSOR) 100 MG tablet 180 tablet 3    Sig: Take 1 tablet (100 mg total) by mouth 2 (two) times daily.     Cardiovascular:  Beta Blockers Passed - 02/14/2023 12:51 PM      Passed - Last BP in normal range    BP Readings from Last 1 Encounters:  09/21/22 116/76         Passed - Last Heart Rate in normal range    Pulse Readings from Last 1 Encounters:  09/21/22 74         Passed - Valid encounter within last 6 months    Recent Outpatient Visits           4 months ago Essential hypertension   [facility] Delsa Grana, PA-C   10 months ago Essential hypertension   [facility] Myles Gip, DO   1 year ago Hypothyroidism, unspecified type   [facility] Bo Merino, FNP   1 year ago Suspected COVID-19 virus infection   [facility] Delsa Grana, PA-C   1 year ago Essential hypertension   [facility] Delsa Grana, PA-C       Future Appointments             In 1 month Delsa Grana, [facility], [place]   In 1 month Jonathon Bellows, MD [facility] at [facility]             olmesartan-hydrochlorothiazide [facility] HCT) 20-12.5 MG tablet 90 tablet 3    Sig: Take 1 tablet by mouth daily.     Cardiovascular: ARB + Diuretic Combos Failed - 02/14/2023 12:51 PM      Failed - Na in normal range and within 180 days    Sodium  Date Value Ref Range Status  11/24/2022 132 (L) 135 - 146 mmol/L Final  04/13/2019 138 134 - 144  mmol/L Final         Passed - K in normal range and within 180 days    Potassium  Date Value Ref Range Status  11/24/2022 4.5 3.5 - 5.3 mmol/L Final         Passed - Cr in normal range and within 180 days    Creat  Date Value Ref Range Status  11/24/2022 0.99 0.70 - 1.30 mg/dL Final         Passed - eGFR is 10 or above and within 180 days    GFR, Est African American  Date Value Ref Range Status  01/19/2021 92 > OR = 60 mL/min/1.59m Final   GFR, Est Non African American  Date Value Ref Range Status  01/19/2021 79 > OR = 60 mL/min/1.775mFinal   GFR  Date Value Ref Range Status  02/19/2020 77.66 >60.00 mL/min Final   eGFR  Date Value Ref Range Status  11/24/2022 91 > OR = 60 mL/min/1.7318minal         Passed - Patient is not pregnant      Passed - Last BP in normal  range    BP Readings from Last 1 Encounters:  09/21/22 116/76         Passed - Valid encounter within last 6 months    Recent Outpatient Visits           4 months ago Essential hypertension   [facility] Delsa Grana, PA-C   10 months ago Essential hypertension   [facility] Myles Gip, DO   1 year ago Hypothyroidism, unspecified type   [facility] Bo Merino, FNP   1 year ago Suspected COVID-19 virus infection   [facility] Delsa Grana, PA-C   1 year ago Essential hypertension   [facility] Delsa Grana, PA-C       Future Appointments             In 1 month Delsa Grana, [facility], [place]   In 1 month Jonathon Bellows, MD [facility] at [place] Refills   levothyroxine (SYNTHROID) 100 MCG tablet 90 tablet 2    Sig: Take 1 tablet (100 mcg total) by mouth daily before breakfast.     Endocrinology:  Hypothyroid Agents Passed - 02/14/2023 12:51 PM       Passed - TSH in normal range and within 360 days    TSH  Date Value Ref Range Status  11/30/2022 3.70 0.40 - 4.50 mIU/L Final         Passed - Valid encounter within last 12 months    Recent Outpatient Visits           4 months ago Essential hypertension   [facility] Delsa Grana, PA-C   10 months ago Essential hypertension   [facility], DO   1 year ago Hypothyroidism, unspecified type   [facility] Bo Merino, FNP   1 year ago Suspected COVID-19 virus infection   [facility] Delsa Grana, PA-C   1 year ago Essential hypertension   [facility] Delsa Grana, PA-C       Future Appointments             In 1 month Delsa Grana, [facility], [facility]   In 1 month Jonathon Bellows, [facility] at [facility]

## 2023-02-14 NOTE — Telephone Encounter (Signed)
Copied from [place] [id number]. Topic: General - Other >> Feb 14, 2023 11:12 AM Everette C wrote: Reason for CRM: Medication Refill - Medication: metoprolol tartrate (LOPRESSOR) 100 MG tablet [id number]  levothyroxine (SYNTHROID) 100 MCG tablet [id number]  olmesartan-hydrochlorothiazide (BENICAR HCT) 20-12.5 MG tablet [id number] - patient has 0 tablets remaining   Has the patient contacted their pharmacy? Yes.   (Agent: If no, request that the patient contact the pharmacy for the refill. If patient does not wish to contact the pharmacy document the reason why and proceed with request.) (Agent: If yes, when and what did the pharmacy advise?)  Preferred Pharmacy (with phone number or street name): [facility] (N[facility] [place] - [address] [place] ([place]) [place] [address] Phone: [phone] Fax: [phone] Hours: Not open 24 hours   Has the patient been seen for an appointment in the last year OR does the patient have an upcoming appointment? Yes.    Agent: Please be advised that RX refills may take up to 3 business days. We ask that you follow-up with your pharmacy.

## 2023-02-14 NOTE — Telephone Encounter (Signed)
Requested Prescriptions  Pending Prescriptions Disp Refills   levothyroxine (SYNTHROID) 100 MCG tablet 90 tablet 2    Sig: Take 1 tablet (100 mcg total) by mouth daily before breakfast.     Endocrinology:  Hypothyroid Agents Passed - 02/14/2023 12:51 PM      Passed - TSH in normal range and within 360 days    TSH  Date Value Ref Range Status  11/30/2022 3.70 0.40 - 4.50 mIU/L Final         Passed - Valid encounter within last 12 months    Recent Outpatient Visits           4 months ago Essential hypertension   [facility] Delsa Grana, PA-C   10 months ago Essential hypertension   [facility] Myles Gip, DO   1 year ago Hypothyroidism, unspecified type   [facility] Bo Merino, FNP   1 year ago Suspected COVID-19 virus infection   [facility] Delsa Grana, PA-C   1 year ago Essential hypertension   [facility] Delsa Grana, PA-C       Future Appointments             In 1 month Delsa Grana, [facility], [place]   In 1 month Jonathon Bellows, MD [facility]             metoprolol tartrate (LOPRESSOR) 100 MG tablet 180 tablet 3    Sig: Take 1 tablet (100 mg total) by mouth 2 (two) times daily.     Cardiovascular:  Beta Blockers Passed - 02/14/2023 12:51 PM      Passed - Last BP in normal range    BP Readings from Last 1 Encounters:  09/21/22 116/76         Passed - Last Heart Rate in normal range    Pulse Readings from Last 1 Encounters:  09/21/22 74         Passed - Valid encounter within last 6 months    Recent Outpatient Visits           4 months ago Essential hypertension   [facility] Delsa Grana, PA-C   10 months ago Essential hypertension   [facility] Myles Gip, DO   1 year ago  Hypothyroidism, unspecified type   [facility] Bo Merino, FNP   1 year ago Suspected COVID-19 virus infection   [facility] Delsa Grana, PA-C   1 year ago Essential hypertension   [facility] Delsa Grana, PA-C       Future Appointments             In 1 month Delsa Grana, [facility], [place]   In 1 month Jonathon Bellows, MD [facility] at [facility]             olmesartan-hydrochlorothiazide [facility] HCT) 20-12.5 MG tablet 90 tablet 3    Sig: Take 1 tablet by mouth daily.     Cardiovascular: ARB + Diuretic Combos Failed - 02/14/2023 12:51 PM      Failed - Na in normal range and within 180 days    Sodium  Date Value Ref Range Status  11/24/2022 132 (L) 135 - 146 mmol/L Final  04/13/2019 138 134 - 144 mmol/L Final  Passed - K in normal range and within 180 days    Potassium  Date Value Ref Range Status  11/24/2022 4.5 3.5 - 5.3 mmol/L Final         Passed - Cr in normal range and within 180 days    Creat  Date Value Ref Range Status  11/24/2022 0.99 0.70 - 1.30 mg/dL Final         Passed - eGFR is 10 or above and within 180 days    GFR, Est African American  Date Value Ref Range Status  01/19/2021 92 > OR = 60 mL/min/1.66m Final   GFR, Est Non African American  Date Value Ref Range Status  01/19/2021 79 > OR = 60 mL/min/1.737mFinal   GFR  Date Value Ref Range Status  02/19/2020 77.66 >60.00 mL/min Final   eGFR  Date Value Ref Range Status  11/24/2022 91 > OR = 60 mL/min/1.7374minal         Passed - Patient is not pregnant      Passed - Last BP in normal range    BP Readings from Last 1 Encounters:  09/21/22 116/76         Passed - Valid encounter within last 6 months    Recent Outpatient Visits           4 months ago [facility]ential [facility]    10 months ago Essential hypertension   [facility]   1 year ago [facility]othyroidism, unspecified type   [facility]   1 year ago Suspected COVID-19 virus infection   [facility]   1 year ago Essential hypertension   [facility]       Future Appointments             In 1 month TapDelsa GranaA-C [facility] Ann[facility]athon [facility]D [facility]

## 2023-03-16 ENCOUNTER — Ambulatory Visit: Payer: Medicaid Other | Admitting: Gastroenterology

## 2023-03-23 ENCOUNTER — Ambulatory Visit: Payer: Medicaid Other | Admitting: Family Medicine

## 2023-04-12 ENCOUNTER — Ambulatory Visit: Payer: Medicaid Other | Admitting: Family Medicine

## 2023-04-12 ENCOUNTER — Encounter: Payer: Self-pay | Admitting: Family Medicine

## 2023-04-12 ENCOUNTER — Telehealth: Payer: Self-pay | Admitting: Family Medicine

## 2023-04-12 ENCOUNTER — Ambulatory Visit: Payer: Medicaid Other | Admitting: Gastroenterology

## 2023-04-12 VITALS — BP 126/82 | HR 86 | Temp 98.0°F | Resp 16 | Ht 69.0 in | Wt 240.3 lb

## 2023-04-12 DIAGNOSIS — E669 Obesity, unspecified: Secondary | ICD-10-CM

## 2023-04-12 DIAGNOSIS — Z23 Encounter for immunization: Secondary | ICD-10-CM

## 2023-04-12 DIAGNOSIS — E785 Hyperlipidemia, unspecified: Secondary | ICD-10-CM | POA: Diagnosis not present

## 2023-04-12 DIAGNOSIS — K760 Fatty (change of) liver, not elsewhere classified: Secondary | ICD-10-CM

## 2023-04-12 DIAGNOSIS — Z5181 Encounter for therapeutic drug level monitoring: Secondary | ICD-10-CM | POA: Diagnosis not present

## 2023-04-12 DIAGNOSIS — E039 Hypothyroidism, unspecified: Secondary | ICD-10-CM

## 2023-04-12 DIAGNOSIS — Z6835 Body mass index (BMI) 35.0-35.9, adult: Secondary | ICD-10-CM

## 2023-04-12 DIAGNOSIS — R220 Localized swelling, mass and lump, head: Secondary | ICD-10-CM

## 2023-04-12 DIAGNOSIS — I10 Essential (primary) hypertension: Secondary | ICD-10-CM | POA: Diagnosis not present

## 2023-04-12 DIAGNOSIS — Z6834 Body mass index (BMI) 34.0-34.9, adult: Secondary | ICD-10-CM | POA: Diagnosis not present

## 2023-04-12 DIAGNOSIS — K2270 Barrett's esophagus without dysplasia: Secondary | ICD-10-CM

## 2023-04-12 DIAGNOSIS — I4819 Other persistent atrial fibrillation: Secondary | ICD-10-CM

## 2023-04-12 DIAGNOSIS — G4733 Obstructive sleep apnea (adult) (pediatric): Secondary | ICD-10-CM

## 2023-04-12 LAB — CBC WITH DIFFERENTIAL/PLATELET
Basophils Absolute: 34 cells/uL (ref 0–200)
Basophils Relative: 0.4 %
Eosinophils Absolute: 151 cells/uL (ref 15–500)
Eosinophils Relative: 1.8 %
MCHC: 33.6 g/dL (ref 32.0–36.0)
MCV: 86.9 fL (ref 80.0–100.0)
MPV: 9.4 fL (ref 7.5–12.5)
Neutrophils Relative %: 75.7 %
Platelets: 246 10*3/uL (ref 140–400)
WBC: 8.4 10*3/uL (ref 3.8–10.8)

## 2023-04-12 MED ORDER — ZOSTER VAC RECOMB ADJUVANTED 50 MCG/0.5ML IM SUSR
0.5000 mL | Freq: Once | INTRAMUSCULAR | 1 refills | Status: AC
Start: 2023-04-12 — End: 2023-04-12

## 2023-04-12 NOTE — Telephone Encounter (Signed)
Copied from CRM [id number]. Topic: General - Other >> Apr 12, 2023 11:12 AM Franchot Heidelberg wrote: Reason for CRM: Pt called reporting that he needs assistance with his CPAP machine, says it may need repairs

## 2023-04-12 NOTE — Progress Notes (Signed)
Name: Dean Anderson   MRN: [id number]    DOB: 28-Jan-1966   Date:04/12/2023       Progress Note  Chief Complaint  Patient presents with   Follow-up   Hypertension   Hyperlipidemia   Hypothyroidism     Subjective:   Dean Anderson is a 57 y.o. male, presents to clinic for routine f/up  Hypertension:  Currently managed on benicar Pt reports good med compliance and denies any SE.   Blood pressure today is well controlled. BP Readings from Last 3 Encounters:  04/12/23 126/82  09/21/22 116/76  03/22/22 124/76   Pt denies CP, SOB, exertional sx, LE edema, palpitation, Ha's, visual disturbances, lightheadedness, hypotension, syncope. Dietary efforts for BP?  None   Hypothyroidism: Current Medication Regimen: 100 mcg synthroid Takes medicine correctly he reports Current Symptoms: denies fatigue, weight changes, heat/cold intolerance, bowel/skin changes or CVS symptoms Most recent results are below; we will be repeating labs today. Lab Results  Component Value Date   TSH 3.70 11/30/2022   Lab Results  Component Value Date   TSH 3.70 11/30/2022   TSH 7.13 (H) 09/21/2022   TSH 5.30 (H) 03/22/2022   TSH 2.80 09/18/2021   TSH 5.23 (H) 07/20/2021   TSH 3.90 01/19/2021   PaFib per cardiology - rate controlled with metoprolol 100 mg BID -he denies any palpitations, he is not anticoagulated because of prior GI bleed Pulse Readings from Last 3 Encounters:  04/12/23 86  09/21/22 74  03/22/22 85    Barretts esophagus - previously lost to f/up with GI, restarted on daily PPI, on omeprazole  OSA on CPAP - having trouble with machine - previously worked up by [employer], but he's not following with them  Last OV with pulm 10/22:   Today's video visit is a 55-month follow-up for sleep apnea. Patient was seen last year for sleep consult. He was set up for a sleep study completed in January that showed moderate sleep apnea with AHI at 20/hour. Patient was recommended to begin  nocturnal CPAP. Patient says he recently got his machine 2 months ago. Says it is starting to work. He has decreased daytime sleepiness. And feels that he benefits from CPAP. Patient has a Furniture conservator/restorer. Patient's had 100% compliance with daily average usage at 5.5 hours. AHI is 1.5. Patient is on CPAP AutoSet 5 to 15 cm H2O.   Past Medical History:   Assessment and Plan: Moderate obstructive sleep apnea with excellent control compliance on nocturnal CPAP. Patient education on sleep apnea and CPAP care.  Morbid obesity. Patient is encouraged on healthy weight loss  Plan  Patient Instructions  Keep up the good work Continue on CPAP at bedtime Work on healthy weight loss Do not drive if sleepy      Current Outpatient Medications:    levothyroxine (SYNTHROID) 100 MCG tablet, Take 1 tablet (100 mcg total) by mouth daily before breakfast., Disp: 90 tablet, Rfl: 2   metoprolol tartrate (LOPRESSOR) 100 MG tablet, Take 1 tablet (100 mg total) by mouth 2 (two) times daily., Disp: 180 tablet, Rfl: 0   olmesartan-hydrochlorothiazide (BENICAR HCT) 20-12.5 MG tablet, Take 1 tablet by mouth daily., Disp: 90 tablet, Rfl: 0   omeprazole (PRILOSEC) 20 MG capsule, Take 1 capsule (20 mg total) by mouth at bedtime., Disp: 90 capsule, Rfl: 3  Patient Active Problem List   Diagnosis Date Noted   Class 1 obesity with serious comorbidity and body mass index (BMI) of 34.0 to 34.9 in adult 09/21/2022  OSA (obstructive sleep apnea) 10/28/2020   Hypothyroidism 06/26/2020   Hepatic steatosis 06/26/2020   Hyperlipidemia 06/26/2020   History of repair of hiatal hernia 03/12/2020   Hyponatremia 02/20/2020   Barrett's esophagus without dysplasia    Essential hypertension 10/02/2018   Persistent atrial fibrillation (HCC) 09/21/2018    Past Surgical History:  Procedure Laterality Date   COLONOSCOPY WITH PROPOFOL N/A 04/17/2019   Procedure: COLONOSCOPY WITH PROPOFOL;  Surgeon: Midge Minium, MD;  Location: [facility]  ENDOSCOPY;  Service: Endoscopy;  Laterality: N/A;   ESOPHAGOGASTRODUODENOSCOPY N/A 03/12/2020   Procedure: ESOPHAGOGASTRODUODENOSCOPY (EGD);  Surgeon: Corliss Skains, MD;  Location: [facility] OR;  Service: Thoracic;  Laterality: N/A;   ESOPHAGOGASTRODUODENOSCOPY (EGD) WITH PROPOFOL N/A 04/17/2019   Procedure: ESOPHAGOGASTRODUODENOSCOPY (EGD) WITH PROPOFOL;  Surgeon: Midge Minium, MD;  Location: [facility] ENDOSCOPY;  Service: Endoscopy;  Laterality: N/A;   GIVENS CAPSULE STUDY  05/2019   negative   VIDEO ASSISTED THORACOSCOPY (VATS)/DECORTICATION Right 03/12/2020   Procedure: VIDEO ASSISTED THORACOSCOPY (VATS)/DRAINING OF AN EMPYEMA;  Surgeon: Corliss Skains, MD;  Location: [facility] OR;  Service: Thoracic;  Laterality: Right;   XI ROBOTIC ASSISTED HIATAL HERNIA REPAIR N/A 03/12/2020   Procedure: XI ROBOTIC ASSISTED HIATAL HERNIA REPAIR USING ACELL MATRIX HIATAL;  Surgeon: Corliss Skains, MD;  Location: [facility] OR;  Service: Thoracic;  Laterality: N/A;    Family History  Problem Relation Age of Onset   Hypertension Mother    Thyroid disease Mother    Stroke Father 78   Liver cancer Maternal Uncle    Heart disease Brother    Hypertension Brother     Social History   Tobacco Use   Smoking status: Never   Smokeless tobacco: Never  Vaping Use   Vaping Use: Never used  Substance Use Topics   Alcohol use: Not Currently    Comment: quit ETOH 03/12/2020   Drug use: Yes    Types: Marijuana    Comment: gummy, last week     No Known Allergies  Health Maintenance  Topic Date Due   INFLUENZA VACCINE  07/07/2023   COLONOSCOPY (Pts 45-3yrs Insurance coverage will need to be confirmed)  04/16/2029   Hepatitis C Screening  Completed   HIV Screening  Completed   HPV VACCINES  Aged Out   DTaP/Tdap/Td  Discontinued   COVID-19 Vaccine  Discontinued   Zoster Vaccines- Shingrix  Discontinued    Chart Review Today: I personally reviewed active problem list, medication list, allergies, family history,  social history, health maintenance, notes from last encounter, lab results, imaging with the patient/caregiver today.   Review of Systems  Constitutional: Negative.   HENT: Negative.    Eyes: Negative.   Respiratory: Negative.    Cardiovascular: Negative.   Gastrointestinal: Negative.   Endocrine: Negative.   Genitourinary: Negative.   Musculoskeletal: Negative.   Skin: Negative.   Allergic/Immunologic: Negative.   Neurological: Negative.   Hematological: Negative.   Psychiatric/Behavioral: Negative.    All other systems reviewed and are negative.    Objective:   Vitals:   04/12/23 1521  BP: 126/82  Pulse: 86  Resp: 16  Temp: 98 F (36.7 C)  TempSrc: Oral  SpO2: 100%  Weight: 240 lb 4.8 oz (109 kg)  Height: 5\' 9"  (1.753 m)    Body mass index is 35.49 kg/m.  Physical Exam Vitals and nursing note reviewed.  Constitutional:      General: He is not in acute distress.    Appearance: Normal appearance. He is well-developed. He is  obese. He is not ill-appearing, toxic-appearing or diaphoretic.  HENT:     Head: Normocephalic and atraumatic.     Comments: Right upper forehead near scalp line a firm roughly 1.5 cm nodule or mass, fleshy color, nonfluctuant, nonmobile    Right Ear: External ear normal.     Left Ear: External ear normal.     Nose: Nose normal.     Mouth/Throat:     Mouth: Mucous membranes are moist.     Pharynx: Oropharynx is clear.  Eyes:     General: No scleral icterus.       Right eye: No discharge.        Left eye: No discharge.     Conjunctiva/sclera: Conjunctivae normal.  Neck:     Trachea: No tracheal deviation.  Cardiovascular:     Rate and Rhythm: Normal rate. Rhythm irregular.     Pulses: Normal pulses.     Heart sounds: Normal heart sounds. No murmur heard.    No friction rub. No gallop.  Pulmonary:     Effort: Pulmonary effort is normal. No respiratory distress.     Breath sounds: Normal breath sounds. No stridor.  Abdominal:      General: Bowel sounds are normal. There is no distension.     Palpations: Abdomen is soft.     Tenderness: There is no abdominal tenderness.  Musculoskeletal:     Right lower leg: No edema.     Left lower leg: No edema.  Skin:    General: Skin is warm and dry.     Capillary Refill: Capillary refill takes less than 2 seconds.     Findings: No rash.  Neurological:     Mental Status: He is alert. Mental status is at baseline.     Motor: No abnormal muscle tone.     Coordination: Coordination normal.     Gait: Gait normal.  Psychiatric:        Mood and Affect: Mood normal.        Behavior: Behavior normal.         Assessment & Plan:   Problem List Items Addressed This Visit       Cardiovascular and Mediastinum   Persistent atrial fibrillation (HCC)    Managed by cardiology, currently rate controlled with metoprolol He denies palpitations, dyspnea, lightheadedness or near syncopal episodes He is not currently anticoagulated due to history of GI bleed which occurred many years ago when his anticoagulation was first started (at that time he was established with [facility] cardiology)      Essential hypertension - Primary    Well-controlled on Benicar and metoprolol for rate control BP Readings from Last 3 Encounters:  04/12/23 126/82  09/21/22 116/76  03/22/22 124/76        Relevant Orders   COMPLETE METABOLIC PANEL WITH GFR (Completed)     Respiratory   OSA (obstructive sleep apnea)    Poor compliance due to difficulty with his CPAP machine he states that he is renting it Last office visit with pulmonology was in 2022        Digestive   Barrett's esophagus without dysplasia    Asymptomatic, patient tends to stop his PPI over and over again I explained the changes in the esophagus which can progress or become cancerous and encouraged him to stay on a PPI      Hepatic steatosis    Monitoring LFTs, incidentally seen on prior imaging        Endocrine  Hypothyroidism    Patient has a history of poor compliance with his thyroid medications and with follow-up office visits and rechecking of labs.  He has stopped his medicines completely and had a high TSH in the past without many symptoms.  He currently does not have any concerns or complaints, will recheck his labs and adjust medications according to TSH      Relevant Orders   COMPLETE METABOLIC PANEL WITH GFR (Completed)   TSH (Completed)     Other   Hyperlipidemia    History of mildly elevated cholesterol in the past without requiring statins He is also consulted with cardiology who did not recommend statin medications Will recheck his lipids and recalculate ASCVD risk      Relevant Orders   Lipid panel (Completed)   COMPLETE METABOLIC PANEL WITH GFR (Completed)   Class 2 severe obesity with serious comorbidity and body mass index (BMI) of 35.0 to 35.9 in adult (HCC)    Wt Readings from Last 5 Encounters:  04/12/23 240 lb 4.8 oz (109 kg)  09/21/22 232 lb 8 oz (105.5 kg)  03/22/22 251 lb 9.6 oz (114.1 kg)  02/05/22 251 lb (113.9 kg)  09/18/21 238 lb 6.4 oz (108.1 kg)   BMI Readings from Last 5 Encounters:  04/12/23 35.49 kg/m  09/21/22 34.33 kg/m  03/22/22 37.15 kg/m  02/05/22 38.16 kg/m  09/18/21 35.21 kg/m  He has gained a little bit of weight no particular diet efforts or exercising      Other Visit Diagnoses     Encounter for medication monitoring       Relevant Orders   Lipid panel (Completed)   COMPLETE METABOLIC PANEL WITH GFR (Completed)   TSH (Completed)   CBC with Differential/Platelet (Completed)   Mass of head       upper right forehead at hairline nodule suspect cyst, ref to derm, pt notified of long wait for derm   Relevant Orders   Ambulatory referral to Dermatology   Need for shingles vaccine       Discussed, recommended he get at the pharmacy        Return in about 6 months (around 10/13/2023) for Annual Physical.   Danelle Berry,  PA-C 04/12/23 3:26 PM

## 2023-04-12 NOTE — Patient Instructions (Signed)
Let me know if you need help with CPAP orders or supplies once you check with [employer]  F/up in 6 months

## 2023-04-12 NOTE — Telephone Encounter (Signed)
Called pt back gave him this information to call and ask since they are more familiar with CPAP supplies: [facility] supply store in [place] Address: [address], [place], [place] [address] Hours:  Open ? Closes 5?PM Phone: [phone]

## 2023-04-13 LAB — CBC WITH DIFFERENTIAL/PLATELET
Absolute Monocytes: 916 cells/uL (ref 200–950)
HCT: 45.8 % (ref 38.5–50.0)
Hemoglobin: 15.4 g/dL (ref 13.2–17.1)
Lymphs Abs: 941 cells/uL (ref 850–3900)
MCH: 29.2 pg (ref 27.0–33.0)
Monocytes Relative: 10.9 %
Neutro Abs: 6359 cells/uL (ref 1500–7800)
RBC: 5.27 10*6/uL (ref 4.20–5.80)
RDW: 13.3 % (ref 11.0–15.0)
Total Lymphocyte: 11.2 %

## 2023-04-13 LAB — COMPLETE METABOLIC PANEL WITH GFR
AG Ratio: 1.4 (calc) (ref 1.0–2.5)
ALT: 16 U/L (ref 9–46)
AST: 16 U/L (ref 10–35)
Albumin: 4.3 g/dL (ref 3.6–5.1)
Alkaline phosphatase (APISO): 40 U/L (ref 35–144)
BUN: 10 mg/dL (ref 7–25)
CO2: 27 mmol/L (ref 20–32)
Calcium: 10.1 mg/dL (ref 8.6–10.3)
Chloride: 95 mmol/L — ABNORMAL LOW (ref 98–110)
Creat: 1.03 mg/dL (ref 0.70–1.30)
Globulin: 3 g/dL (calc) (ref 1.9–3.7)
Glucose, Bld: 95 mg/dL (ref 65–99)
Potassium: 5.3 mmol/L (ref 3.5–5.3)
Sodium: 132 mmol/L — ABNORMAL LOW (ref 135–146)
Total Bilirubin: 1.9 mg/dL — ABNORMAL HIGH (ref 0.2–1.2)
Total Protein: 7.3 g/dL (ref 6.1–8.1)
eGFR: 86 mL/min/{1.73_m2} (ref >=60)

## 2023-04-13 LAB — LIPID PANEL
Cholesterol: 187 mg/dL (ref <200)
HDL: 53 mg/dL (ref >=40)
LDL Cholesterol (Calc): 117 mg/dL (calc) — ABNORMAL HIGH
Non-HDL Cholesterol (Calc): 134 mg/dL (calc) — ABNORMAL HIGH (ref <130)
Total CHOL/HDL Ratio: 3.5 (calc) (ref <5.0)
Triglycerides: 74 mg/dL (ref <150)

## 2023-04-13 LAB — TSH: TSH: 7.85 mIU/L — ABNORMAL HIGH (ref 0.40–4.50)

## 2023-04-15 ENCOUNTER — Other Ambulatory Visit: Payer: Self-pay | Admitting: Family Medicine

## 2023-04-15 DIAGNOSIS — E039 Hypothyroidism, unspecified: Secondary | ICD-10-CM

## 2023-04-15 MED ORDER — LEVOTHYROXINE SODIUM 112 MCG PO TABS
112.0000 ug | ORAL_TABLET | Freq: Every day | ORAL | 0 refills | Status: DC
Start: 2023-04-15 — End: 2023-08-04

## 2023-04-19 ENCOUNTER — Encounter: Payer: Self-pay | Admitting: Family Medicine

## 2023-04-19 NOTE — Assessment & Plan Note (Signed)
Poor compliance due to difficulty with his CPAP machine he states that he is renting it Last office visit with pulmonology was in 2022

## 2023-04-19 NOTE — Assessment & Plan Note (Signed)
Monitoring LFTs, incidentally seen on prior imaging

## 2023-04-19 NOTE — Assessment & Plan Note (Signed)
Managed by cardiology, currently rate controlled with metoprolol He denies palpitations, dyspnea, lightheadedness or near syncopal episodes He is not currently anticoagulated due to history of GI bleed which occurred many years ago when his anticoagulation was first started (at that time he was established with [facility] cardiology)

## 2023-04-19 NOTE — Assessment & Plan Note (Signed)
Well-controlled on Benicar and metoprolol for rate control BP Readings from Last 3 Encounters:  04/12/23 126/82  09/21/22 116/76  03/22/22 124/76

## 2023-04-19 NOTE — Assessment & Plan Note (Signed)
Wt Readings from Last 5 Encounters:  04/12/23 240 lb 4.8 oz (109 kg)  09/21/22 232 lb 8 oz (105.5 kg)  03/22/22 251 lb 9.6 oz (114.1 kg)  02/05/22 251 lb (113.9 kg)  09/18/21 238 lb 6.4 oz (108.1 kg)   BMI Readings from Last 5 Encounters:  04/12/23 35.49 kg/m  09/21/22 34.33 kg/m  03/22/22 37.15 kg/m  02/05/22 38.16 kg/m  09/18/21 35.21 kg/m   He has gained a little bit of weight no particular diet efforts or exercising

## 2023-04-19 NOTE — Assessment & Plan Note (Signed)
History of mildly elevated cholesterol in the past without requiring statins He is also consulted with cardiology who did not recommend statin medications Will recheck his lipids and recalculate ASCVD risk

## 2023-04-19 NOTE — Assessment & Plan Note (Signed)
Asymptomatic, patient tends to stop his PPI over and over again I explained the changes in the esophagus which can progress or become cancerous and encouraged him to stay on a PPI

## 2023-04-19 NOTE — Assessment & Plan Note (Signed)
Patient has a history of poor compliance with his thyroid medications and with follow-up office visits and rechecking of labs.  He has stopped his medicines completely and had a high TSH in the past without many symptoms.  He currently does not have any concerns or complaints, will recheck his labs and adjust medications according to TSH

## 2023-05-13 ENCOUNTER — Other Ambulatory Visit: Payer: Self-pay | Admitting: Family Medicine

## 2023-05-13 DIAGNOSIS — I10 Essential (primary) hypertension: Secondary | ICD-10-CM

## 2023-05-13 NOTE — Telephone Encounter (Signed)
Copied from CRM [id number]. Topic: General - Other >> May 13, 2023 12:54 PM Everette C wrote: Reason for CRM: Medication Refill - Medication: olmesartan-hydrochlorothiazide (BENICAR HCT) 20-12.5 MG tablet [[id number]]  Has the patient contacted their pharmacy? Yes.   (Agent: If no, request that the patient contact the pharmacy for the refill. If patient does not wish to contact the pharmacy document the reason why and proceed with request.) (Agent: If yes, when and what did the pharmacy advise?)  Preferred Pharmacy (with phone number or street name): [facility] [address]), [place] - [address]) [place] [address] Phone: [phone] Fax: [phone] Hours: Not open 24 hours   Has the patient been seen for an appointment in the last year OR does the patient have an upcoming appointment? Yes.    Agent: Please be advised that RX refills may take up to 3 business days. We ask that you follow-up with your pharmacy.

## 2023-05-13 NOTE — Telephone Encounter (Signed)
Unable to refill per protocol, Rx request was refilled 05/13/23 for 90 days.  Requested Prescriptions  Pending Prescriptions Disp Refills   olmesartan-hydrochlorothiazide (BENICAR HCT) 20-12.5 MG tablet 90 tablet 0    Sig: Take 1 tablet by mouth daily.     Cardiovascular: ARB + Diuretic Combos Failed - 05/13/2023  1:59 PM      Failed - Na in normal range and within 180 days    Sodium  Date Value Ref Range Status  04/12/2023 132 (L) 135 - 146 mmol/L Final  04/13/2019 138 134 - 144 mmol/L Final         Passed - K in normal range and within 180 days    Potassium  Date Value Ref Range Status  04/12/2023 5.3 3.5 - 5.3 mmol/L Final         Passed - Cr in normal range and within 180 days    Creat  Date Value Ref Range Status  04/12/2023 1.03 0.70 - 1.30 mg/dL Final         Passed - eGFR is 10 or above and within 180 days    GFR, Est African American  Date Value Ref Range Status  01/19/2021 92 > OR = 60 mL/min/1.61m2 Final   GFR, Est Non African American  Date Value Ref Range Status  01/19/2021 79 > OR = 60 mL/min/1.6m2 Final   GFR  Date Value Ref Range Status  02/19/2020 77.66 >60.00 mL/min Final   eGFR  Date Value Ref Range Status  04/12/2023 86 > OR = 60 mL/min/1.64m2 Final         Passed - Patient is not pregnant      Passed - Last BP in normal range    BP Readings from Last 1 Encounters:  04/12/23 126/82         Passed - Valid encounter within last 6 months    Recent Outpatient Visits           1 month ago Essential hypertension   [facility] Danelle Berry, PA-C   7 months ago Essential hypertension   [facility] Danelle Berry, PA-C   1 year ago Essential hypertension   [facility] Caro Laroche, DO   1 year ago Hypothyroidism, unspecified type   [facility] Berniece Salines, FNP   1 year ago Suspected COVID-19 virus infection   [facility]  [facility] Danelle Berry, PA-C       Future Appointments             In 5 months Danelle Berry, PA-C [facility], [facility]

## 2023-06-15 DIAGNOSIS — G4733 Obstructive sleep apnea (adult) (pediatric): Secondary | ICD-10-CM | POA: Diagnosis not present

## 2023-06-15 DIAGNOSIS — I10 Essential (primary) hypertension: Secondary | ICD-10-CM | POA: Diagnosis not present

## 2023-08-04 ENCOUNTER — Other Ambulatory Visit: Payer: Self-pay | Admitting: Family Medicine

## 2023-08-04 DIAGNOSIS — I10 Essential (primary) hypertension: Secondary | ICD-10-CM

## 2023-08-04 DIAGNOSIS — E039 Hypothyroidism, unspecified: Secondary | ICD-10-CM

## 2023-08-16 ENCOUNTER — Ambulatory Visit: Payer: Medicaid Other | Admitting: Family Medicine

## 2023-08-16 DIAGNOSIS — I10 Essential (primary) hypertension: Secondary | ICD-10-CM

## 2023-08-16 DIAGNOSIS — E785 Hyperlipidemia, unspecified: Secondary | ICD-10-CM

## 2023-08-16 DIAGNOSIS — Z5181 Encounter for therapeutic drug level monitoring: Secondary | ICD-10-CM

## 2023-08-16 DIAGNOSIS — E039 Hypothyroidism, unspecified: Secondary | ICD-10-CM

## 2023-08-23 ENCOUNTER — Encounter: Payer: Self-pay | Admitting: Family Medicine

## 2023-08-23 ENCOUNTER — Other Ambulatory Visit: Payer: Self-pay | Admitting: Family Medicine

## 2023-08-23 ENCOUNTER — Ambulatory Visit: Payer: Medicaid Other | Admitting: Family Medicine

## 2023-08-23 VITALS — BP 134/84 | HR 84 | Temp 97.7°F | Resp 16 | Ht 69.0 in | Wt 235.8 lb

## 2023-08-23 DIAGNOSIS — E039 Hypothyroidism, unspecified: Secondary | ICD-10-CM | POA: Diagnosis not present

## 2023-08-23 DIAGNOSIS — E785 Hyperlipidemia, unspecified: Secondary | ICD-10-CM

## 2023-08-23 DIAGNOSIS — G4733 Obstructive sleep apnea (adult) (pediatric): Secondary | ICD-10-CM

## 2023-08-23 DIAGNOSIS — Z6835 Body mass index (BMI) 35.0-35.9, adult: Secondary | ICD-10-CM

## 2023-08-23 DIAGNOSIS — I10 Essential (primary) hypertension: Secondary | ICD-10-CM

## 2023-08-23 DIAGNOSIS — Z5181 Encounter for therapeutic drug level monitoring: Secondary | ICD-10-CM

## 2023-08-25 ENCOUNTER — Other Ambulatory Visit: Payer: Self-pay | Admitting: Family Medicine

## 2023-08-25 DIAGNOSIS — E039 Hypothyroidism, unspecified: Secondary | ICD-10-CM

## 2023-08-25 DIAGNOSIS — I10 Essential (primary) hypertension: Secondary | ICD-10-CM

## 2023-08-25 MED ORDER — METOPROLOL TARTRATE 100 MG PO TABS
100.0000 mg | ORAL_TABLET | Freq: Two times a day (BID) | ORAL | 1 refills | Status: DC
Start: 2023-08-25 — End: 2024-07-23

## 2023-08-25 MED ORDER — LEVOTHYROXINE SODIUM 112 MCG PO TABS
112.0000 ug | ORAL_TABLET | Freq: Every day | ORAL | 1 refills | Status: DC
Start: 2023-08-25 — End: 2023-12-13

## 2023-08-25 MED ORDER — OLMESARTAN MEDOXOMIL-HCTZ 20-12.5 MG PO TABS
1.0000 | ORAL_TABLET | Freq: Every day | ORAL | 1 refills | Status: DC
Start: 2023-08-25 — End: 2024-01-23

## 2023-09-19 ENCOUNTER — Encounter: Payer: Self-pay | Admitting: Family Medicine

## 2023-09-19 NOTE — Progress Notes (Signed)
Name: Dean Anderson   MRN: [id number]    DOB: [birth year]   Date:09/19/2023       Progress Note  Chief Complaint  Patient presents with   Medical Management of Chronic Issues     Subjective:   Dean Anderson is a 57 y.o. male, presents to clinic for f/up on chronic conditions  Hypothyroid - last TSH abnormal, pt did not f/up as recommended for recheck in 2 months On 112 mcg dose Due for recheck of labs  GERD/barretts - still taking long term PPI, GI sx well controlled  Afib/HTN on metoprolol and benicar No palpitations, CP, SOB BP Readings from Last 3 Encounters:  08/23/23 134/84  04/12/23 126/82  09/21/22 116/76  Bp well controlled, sees cardiology    Current Outpatient Medications:    omeprazole (PRILOSEC) 20 MG capsule, Take 1 capsule (20 mg total) by mouth at bedtime., Disp: 90 capsule, Rfl: 3   levothyroxine (SYNTHROID) 112 MCG tablet, Take 1 tablet (112 mcg total) by mouth daily before breakfast., Disp: 90 tablet, Rfl: 1   metoprolol tartrate (LOPRESSOR) 100 MG tablet, Take 1 tablet (100 mg total) by mouth 2 (two) times daily., Disp: 180 tablet, Rfl: 1   olmesartan-hydrochlorothiazide (BENICAR HCT) 20-12.5 MG tablet, Take 1 tablet by mouth daily., Disp: 90 tablet, Rfl: 1  Patient Active Problem List   Diagnosis Date Noted   Class 2 severe obesity with serious comorbidity and body mass index (BMI) of 35.0 to 35.9 in adult [facility]) 09/21/2022   OSA (obstructive sleep apnea) 10/28/2020   Hypothyroidism 06/26/2020   Hepatic steatosis 06/26/2020   Hyperlipidemia 06/26/2020   History of repair of hiatal hernia 03/12/2020   Hyponatremia 02/20/2020   Barrett's esophagus without dysplasia    Essential hypertension 10/02/2018   Persistent atrial fibrillation (HCC) 09/21/2018    Past Surgical History:  Procedure Laterality Date   COLONOSCOPY WITH PROPOFOL N/A 04/17/2019   Procedure: COLONOSCOPY WITH PROPOFOL;  Surgeon: Midge Minium, MD;  Location: [facility] ENDOSCOPY;   Service: Endoscopy;  Laterality: N/A;   ESOPHAGOGASTRODUODENOSCOPY N/A 03/12/2020   Procedure: ESOPHAGOGASTRODUODENOSCOPY (EGD);  Surgeon: Corliss Skains, MD;  Location: [facility] OR;  Service: Thoracic;  Laterality: N/A;   ESOPHAGOGASTRODUODENOSCOPY (EGD) WITH PROPOFOL N/A 04/17/2019   Procedure: ESOPHAGOGASTRODUODENOSCOPY (EGD) WITH PROPOFOL;  Surgeon: Midge Minium, MD;  Location: [facility] ENDOSCOPY;  Service: Endoscopy;  Laterality: N/A;   GIVENS CAPSULE STUDY  05/2019   negative   VIDEO ASSISTED THORACOSCOPY (VATS)/DECORTICATION Right 03/12/2020   Procedure: VIDEO ASSISTED THORACOSCOPY (VATS)/DRAINING OF AN EMPYEMA;  Surgeon: Corliss Skains, MD;  Location: [facility] OR;  Service: Thoracic;  Laterality: Right;   XI ROBOTIC ASSISTED HIATAL HERNIA REPAIR N/A 03/12/2020   Procedure: XI ROBOTIC ASSISTED HIATAL HERNIA REPAIR USING ACELL MATRIX HIATAL;  Surgeon: Corliss Skains, MD;  Location: [facility] OR;  Service: Thoracic;  Laterality: N/A;    Family History  Problem Relation Age of Onset   Hypertension Mother    Thyroid disease Mother    Stroke Father 81   Liver cancer Maternal Uncle    Heart disease Brother    Hypertension Brother     Social History   Tobacco Use   Smoking status: Never   Smokeless tobacco: Never  Vaping Use   Vaping status: Never Used  Substance Use Topics   Alcohol use: Not Currently    Comment: quit ETOH 03/12/2020   Drug use: Yes    Types: Marijuana    Comment: gummy, last week     No Known  Allergies  Health Maintenance  Topic Date Due   INFLUENZA VACCINE  03/05/2024 (Originally 07/07/2023)   Colonoscopy  04/16/2029   Hepatitis C Screening  Completed   HIV Screening  Completed   HPV VACCINES  Aged Out   DTaP/Tdap/Td  Discontinued   COVID-19 Vaccine  Discontinued   Zoster Vaccines- Shingrix  Discontinued    Chart Review Today: I personally reviewed active problem list, medication list, allergies, family history, social history, health maintenance, notes from  last encounter, lab results, imaging with the patient/caregiver today.   Review of Systems  Constitutional: Negative.   HENT: Negative.    Eyes: Negative.   Respiratory: Negative.    Cardiovascular: Negative.   Gastrointestinal: Negative.   Endocrine: Negative.   Genitourinary: Negative.   Musculoskeletal: Negative.   Skin: Negative.   Allergic/Immunologic: Negative.   Neurological: Negative.   Hematological: Negative.   Psychiatric/Behavioral: Negative.    All other systems reviewed and are negative.    Objective:   Vitals:   08/23/23 1451  BP: 134/84  Pulse: 84  Resp: 16  Temp: 97.7 F (36.5 C)  TempSrc: Oral  SpO2: 97%  Weight: 235 lb 12.8 oz (107 kg)  Height: 5\' 9"  (1.753 m)    Body mass index is 34.82 kg/m.  Physical Exam Vitals and nursing note reviewed.  Constitutional:      General: He is not in acute distress.    Appearance: Normal appearance. He is well-developed. He is obese. He is not ill-appearing, toxic-appearing or diaphoretic.  HENT:     Head: Normocephalic and atraumatic.     Nose: Nose normal.  Eyes:     General: No scleral icterus.       Right eye: No discharge.        Left eye: No discharge.     Conjunctiva/sclera: Conjunctivae normal.  Neck:     Trachea: No tracheal deviation.  Cardiovascular:     Rate and Rhythm: Normal rate. Rhythm irregular.     Pulses: Normal pulses.  Pulmonary:     Effort: Pulmonary effort is normal. No respiratory distress.     Breath sounds: No stridor.  Musculoskeletal:     Right lower leg: No edema.     Left lower leg: No edema.  Skin:    General: Skin is warm and dry.     Coloration: Skin is not jaundiced.     Findings: No rash.  Neurological:     Mental Status: He is alert.     Motor: No abnormal muscle tone.     Coordination: Coordination normal.  Psychiatric:        Mood and Affect: Mood normal.        Behavior: Behavior normal.         Assessment & Plan:     ICD-10-CM   1.  Hypothyroidism, unspecified type  E03.9    overdue for recheck of TSH, on 112 mcg daily, reports sx euvolemic, hx of poor med compliance, recheck labs and adjust med dose if needed    2. Essential hypertension  I10    labs previously ordered, BP near goal on benicar    3. Hyperlipidemia, unspecified hyperlipidemia type  E78.5    not currently on statin, last lipid panel reviewed and done only 4 months ago    4. OSA (obstructive sleep apnea)  G47.33     5. Class 2 severe obesity with serious comorbidity and body mass index (BMI) of 35.0 to 35.9 in adult, unspecified obesity type (  HCC)  E66.01    Z68.35    weight/BMI down slightly to 34    6. Encounter for medication monitoring  Z51.81    labs ordered previously, getting completed today     Routine f/up in 4-6 months - sooner needed if TSH is still abnormal and med dose is changed again  Danelle Berry, PA-C 09/19/23 1:30 PM

## 2023-10-13 ENCOUNTER — Other Ambulatory Visit: Payer: Self-pay | Admitting: Family Medicine

## 2023-10-13 DIAGNOSIS — E039 Hypothyroidism, unspecified: Secondary | ICD-10-CM

## 2023-10-17 ENCOUNTER — Encounter: Payer: Self-pay | Admitting: Dermatology

## 2023-10-17 ENCOUNTER — Ambulatory Visit: Payer: Medicaid Other | Admitting: Dermatology

## 2023-10-17 VITALS — BP 128/71 | HR 83

## 2023-10-17 DIAGNOSIS — C44319 Basal cell carcinoma of skin of other parts of face: Secondary | ICD-10-CM

## 2023-10-17 DIAGNOSIS — R2232 Localized swelling, mass and lump, left upper limb: Secondary | ICD-10-CM | POA: Diagnosis not present

## 2023-10-17 DIAGNOSIS — W908XXA Exposure to other nonionizing radiation, initial encounter: Secondary | ICD-10-CM | POA: Diagnosis not present

## 2023-10-17 DIAGNOSIS — L578 Other skin changes due to chronic exposure to nonionizing radiation: Secondary | ICD-10-CM | POA: Diagnosis not present

## 2023-10-17 DIAGNOSIS — D492 Neoplasm of unspecified behavior of bone, soft tissue, and skin: Secondary | ICD-10-CM | POA: Diagnosis not present

## 2023-10-17 DIAGNOSIS — R229 Localized swelling, mass and lump, unspecified: Secondary | ICD-10-CM

## 2023-10-17 DIAGNOSIS — D485 Neoplasm of uncertain behavior of skin: Secondary | ICD-10-CM

## 2023-10-17 NOTE — Progress Notes (Signed)
   New Patient Visit   Subjective  Labron Kilian is a 57 y.o. male who presents for the following: right forehead  Pt has growth on right forehead for around a year that is bothersome and he'd like to discuss removal.  The following portions of the chart were reviewed this encounter and updated as appropriate: medications, allergies, medical history  Review of Systems:  No other skin or systemic complaints except as noted in HPI or Assessment and Plan.  Objective  Well appearing patient in no apparent distress; mood and affect are within normal limits.  A focused examination was performed of the following areas: forehead  Relevant exam findings are noted in the Assessment and Plan.  Left Temple Crusted plaque         Assessment & Plan   Neoplasm of Skin- DDX lipoma vs cyst vs other Exam: Subcutaneous nodule at right head 3 x 2.2  Benign-appearing. Exam most consistent with an epidermal inclusion cyst. Discussed that a cyst is a benign growth that can grow over time and sometimes get irritated or inflamed. Recommend observation if it is not bothersome. Discussed option of surgical excision to remove it if it is growing, symptomatic, or other changes noted. Please call for new or changing lesions so they can be evaluated.   Neoplasm of uncertain behavior of skin Left Temple  Skin / nail biopsy Type of biopsy: tangential   Instrument used: DermaBlade   Post-procedure details: wound care instructions given    Specimen 1 - Surgical pathology Differential Diagnosis: R/O NMSC  Check Margins: No   ACTINIC DAMAGE - chronic, secondary to cumulative UV radiation exposure/sun exposure over time - diffuse scaly erythematous macules with underlying dyspigmentation - Recommend daily broad spectrum sunscreen SPF 30+ to sun-exposed areas, reapply every 2 hours as needed.  - Recommend staying in the shade or wearing long sleeves, sun glasses (UVA+UVB protection) and wide brim  hats (4-inch brim around the entire circumference of the hat). - Call for new or changing lesions.  Neoplasm of left upper- Subcutaneous nodule on left dorsal wrist - Discussed risks and benefits of excision including immediate post-operative restrictions, risks of bleeding, hematoma, permanent nerve changes including numbness or pain  - Discussed that patient will need imaging to evaluate lesion - [place] order sent  Return for excision.  I, Tillie Fantasia, CMA, am acting as scribe for Gwenith Daily, MD.   Documentation: I have reviewed the above documentation for accuracy and completeness, and I agree with the above.  Gwenith Daily, MD

## 2023-10-17 NOTE — Patient Instructions (Addendum)
To schedule your radiology study, please call [phone]   Patient Handout: Wound Care for Skin Biopsy Site  Taking Care of Your Skin Biopsy Site  Proper care of the biopsy site is essential for promoting healing and minimizing scarring. This handout provides instructions on how to care for your biopsy site to ensure optimal recovery.  1. Cleaning the Wound:  Clean the biopsy site daily with gentle soap and water. Gently pat the area dry with a clean, soft towel. Avoid harsh scrubbing or rubbing the area, as this can irritate the skin and delay healing.  2. Applying Aquaphor and Bandage:  After cleaning the wound, apply a thin layer of Aquaphor ointment to the biopsy site. Cover the area with a sterile bandage to protect it from dirt, bacteria, and friction. Change the bandage daily or as needed if it becomes soiled or wet.  3. Continued Care for One Week:  Repeat the cleaning, Aquaphor application, and bandaging process daily for one week following the biopsy procedure. Keeping the wound clean and moist during this initial healing period will help prevent infection and promote optimal healing.  4. Massaging Aquaphor into the Area:  ---After one week, discontinue the use of bandages but continue to apply Aquaphor to the biopsy site. ----Gently massage the Aquaphor into the area using circular motions. ---Massaging the skin helps to promote circulation and prevent the formation of scar tissue.   Additional Tips:  Avoid exposing the biopsy site to direct sunlight during the healing process, as this can cause hyperpigmentation or worsen scarring. If you experience any signs of infection, such as increased redness, swelling, warmth, or drainage from the wound, contact your healthcare provider immediately. Follow any additional instructions provided by your healthcare provider for caring for the biopsy site and managing any discomfort. Conclusion:  Taking proper care of your skin  biopsy site is crucial for ensuring optimal healing and minimizing scarring. By following these instructions for cleaning, applying Aquaphor, and massaging the area, you can promote a smooth and successful recovery. If you have any questions or concerns about caring for your biopsy site, don't hesitate to contact your healthcare provider for guidance.    Important Information   Due to recent changes in healthcare laws, you may see results of your pathology and/or laboratory studies on MyChart before the doctors have had a chance to review them. We understand that in some cases there may be results that are confusing or concerning to you. Please understand that not all results are received at the same time and often the doctors may need to interpret multiple results in order to provide you with the best plan of care or course of treatment. Therefore, we ask that you please give [place] 2 business days to thoroughly review all your results before contacting the office for clarification. Should we see a critical lab result, you will be contacted sooner.     If You Need Anything After Your Visit   If you have any questions or concerns for your doctor, please call our main line at [phone]. If no one answers, please leave a voicemail as directed and we will return your call as soon as possible. Messages left after 4 pm will be answered the following business day.    You may also send [place] a message via MyChart. We typically respond to MyChart messages within 1-2 business days.  For prescription refills, please ask your pharmacy to contact our office. Our fax number is [phone].  If you have  an urgent issue when the clinic is closed that cannot wait until the next business day, you can page your doctor at the number below.     Please note that while we do our best to be available for urgent issues outside of office hours, we are not available 24/7.    If you have an urgent issue and are unable to reach  [place], you may choose to seek medical care at your doctor's office, retail clinic, urgent care center, or emergency room.   If you have a medical emergency, please immediately call 911 or go to the emergency department. In the event of inclement weather, please call our main line at [phone] for an update on the status of any delays or closures.  Dermatology Medication Tips: Please keep the boxes that topical medications come in in order to help keep track of the instructions about where and how to use these. Pharmacies typically print the medication instructions only on the boxes and not directly on the medication tubes.   If your medication is too expensive, please contact our office at [phone] or send [place] a message through MyChart.    We are unable to tell what your co-pay for medications will be in advance as this is different depending on your insurance coverage. However, we may be able to find a substitute medication at lower cost or fill out paperwork to get insurance to cover a needed medication.    If a prior authorization is required to get your medication covered by your insurance company, please allow [place] 1-2 business days to complete this process.   Drug prices often vary depending on where the prescription is filled and some pharmacies may offer cheaper prices.   The website www.goodrx.com contains coupons for medications through different pharmacies. The prices here do not account for what the cost may be with help from insurance (it may be cheaper with your insurance), but the website can give you the price if you did not use any insurance.  - You can print the associated coupon and take it with your prescription to the pharmacy.  - You may also stop by our office during regular business hours and pick up a GoodRx coupon card.  - If you need your prescription sent electronically to a different pharmacy, notify our office through [facility] or by phone at [phone]

## 2023-10-18 ENCOUNTER — Ambulatory Visit: Payer: Medicaid Other | Admitting: Physician Assistant

## 2023-10-18 ENCOUNTER — Encounter: Payer: Self-pay | Admitting: Physician Assistant

## 2023-10-18 VITALS — BP 124/84 | HR 89 | Temp 97.3°F | Resp 16 | Ht 69.0 in | Wt 236.7 lb

## 2023-10-18 DIAGNOSIS — Z125 Encounter for screening for malignant neoplasm of prostate: Secondary | ICD-10-CM

## 2023-10-18 DIAGNOSIS — Z7189 Other specified counseling: Secondary | ICD-10-CM | POA: Insufficient documentation

## 2023-10-18 DIAGNOSIS — Z0000 Encounter for general adult medical examination without abnormal findings: Secondary | ICD-10-CM

## 2023-10-18 DIAGNOSIS — E66812 Obesity, class 2: Secondary | ICD-10-CM

## 2023-10-18 DIAGNOSIS — Z6835 Body mass index (BMI) 35.0-35.9, adult: Secondary | ICD-10-CM | POA: Diagnosis not present

## 2023-10-18 DIAGNOSIS — E785 Hyperlipidemia, unspecified: Secondary | ICD-10-CM

## 2023-10-18 HISTORY — DX: Other specified counseling: Z71.89

## 2023-10-18 NOTE — Assessment & Plan Note (Signed)
A voluntary discussion about advance care planning including the explanation and discussion of advance directives was extensively discussed  with the patient for 5  minutes with patient and myself present.  Explanation about the health care proxy and Living will was reviewed and packet with forms with explanation of how to fill them out was given.  During this discussion, the patient was able to identify a health care proxy as his wife and plans to fill out the paperwork required.  Patient was offered a separate Advance Care Planning visit for further assistance with forms.

## 2023-10-18 NOTE — Progress Notes (Signed)
Annual Physical Exam   Name: Dean Anderson   MRN: [id number]    DOB: 1966-01-18   Date:10/18/2023  Today's Provider: Jacquelin Hawking, MHS, PA-C Introduced myself to the patient as a PA-C and provided education on APPs in clinical practice.         Subjective  Chief Complaint  Chief Complaint  Patient presents with   Annual Exam    HPI  Patient presents for annual CPE .   Diet: Overall normal diet  Exercise: He reports that he has an active job but is not engaged in regular exercise  Sleep:"since I got my CPAP, it's been pretty good" getting about 6-7 hours per night, sometimes feels well rested in the AM  Mood: "always pretty good"     IPSS     Row Name 10/18/23 1512         International Prostate Symptom Score   How often have you had the sensation of not emptying your bladder? Not at All     How often have you had to urinate less than every two hours? Not at All     How often have you found you stopped and started again several times when you urinated? Not at All     How often have you found it difficult to postpone urination? Not at All     How often have you had a weak urinary stream? Not at All     How often have you had to strain to start urination? Not at All     How many times did you typically get up at night to urinate? 1 Time     Total IPSS Score 1       Quality of Life due to urinary symptoms   If you were to spend the rest of your life with your urinary condition just the way it is now how would you feel about that? Pleased                 Depression: phq 9 is negative    10/18/2023    3:07 PM 08/23/2023    2:53 PM 04/12/2023    3:17 PM 09/21/2022    3:20 PM 03/22/2022    3:02 PM  Depression screen PHQ 2/9  Decreased Interest 0 0 0 0 0  Down, Depressed, Hopeless 0 0 0 0 0  PHQ - 2 Score 0 0 0 0 0  Altered sleeping 0 0 0 0   Tired, decreased energy 0 0 0 0   Change in appetite 0 0 0 0   Feeling bad or failure about yourself  0 0 0 0    Trouble concentrating 0 0 0 0   Moving slowly or fidgety/restless 0 0 0 0   Suicidal thoughts 0 0 0 0   PHQ-9 Score 0 0 0 0   Difficult doing work/chores Not difficult at all Not difficult at all Not difficult at all Not difficult at all     Hypertension:  BP Readings from Last 3 Encounters:  10/18/23 124/84  10/17/23 128/71  08/23/23 134/84    Obesity: Wt Readings from Last 3 Encounters:  10/18/23 236 lb 11.2 oz (107.4 kg)  08/23/23 235 lb 12.8 oz (107 kg)  04/12/23 240 lb 4.8 oz (109 kg)   BMI Readings from Last 3 Encounters:  10/18/23 34.95 kg/m  08/23/23 34.82 kg/m  04/12/23 35.49 kg/m     Lipids:  Lab Results  Component  Value Date   CHOL 187 04/12/2023   CHOL 179 09/21/2022   CHOL 182 03/22/2022   Lab Results  Component Value Date   HDL 53 04/12/2023   HDL 52 09/21/2022   HDL 50 03/22/2022   Lab Results  Component Value Date   LDLCALC 117 (H) 04/12/2023   LDLCALC 104 (H) 09/21/2022   LDLCALC 110 (H) 03/22/2022   Lab Results  Component Value Date   TRIG 74 04/12/2023   TRIG 136 09/21/2022   TRIG 116 03/22/2022   Lab Results  Component Value Date   CHOLHDL 3.5 04/12/2023   CHOLHDL 3.4 09/21/2022   CHOLHDL 3.6 03/22/2022   No results found for: "LDLDIRECT" Glucose:  Glucose, Bld  Date Value Ref Range Status  08/23/2023 94 65 - 99 mg/dL Final    Comment:    .            Fasting reference interval .   04/12/2023 95 65 - 99 mg/dL Final    Comment:    .            Fasting reference interval .   11/24/2022 97 65 - 99 mg/dL Final    Comment:    .            Fasting reference interval .    Glucose-Capillary  Date Value Ref Range Status  03/13/2020 113 (H) 70 - 99 mg/dL Final    Comment:    Glucose reference range applies only to samples taken after fasting for at least 8 hours.    Flowsheet Row Office Visit from 10/18/2023 in [facility]  AUDIT-C Score 5        Married STD testing and prevention  (HIV/chl/gon/syphilis):  no, declines screening  Skin cancer: Discussed monitoring for atypical lesions Prostate cancer screening:  ordered No results found for: "PSA"   Health Maintenance  Topic Date Due   Flu Shot  03/05/2024*   Colon Cancer Screening  04/16/2029   Hepatitis C Screening  Completed   HIV Screening  Completed   HPV Vaccine  Aged Out   DTaP/Tdap/Td vaccine  Discontinued   COVID-19 Vaccine  Discontinued   Zoster (Shingles) Vaccine  Discontinued  *Topic was postponed. The date shown is not the original due date.     Lung cancer:  Low Dose CT Chest recommended if Age 35-80 years, 30 pack-year currently smoking OR have quit w/in 15years. Patient  not applicable- he has never smoked tobacco  AAA: The USPSTF recommends one-time screening with ultrasonography in men ages 56 to 74 years who have ever smoked. Patient:  not applicable ECG:  NA    Advanced Care Planning: A voluntary discussion about advance care planning including the explanation and discussion of advance directives.  Discussed health care proxy and Living will, and the patient was able to identify a health care proxy as no one.  Patient does not have a living will in effect at this time.  Patient Active Problem List   Diagnosis Date Noted   Advanced care planning/counseling discussion 10/18/2023   Class 2 severe obesity with serious comorbidity and body mass index (BMI) of 35.0 to 35.9 in adult [facility]) 09/21/2022   OSA (obstructive sleep apnea) 10/28/2020   Hypothyroidism 06/26/2020   Hepatic steatosis 06/26/2020   Hyperlipidemia 06/26/2020   History of repair of hiatal hernia 03/12/2020   Hyponatremia 02/20/2020   Barrett's esophagus without dysplasia    Essential hypertension 10/02/2018   Persistent atrial fibrillation (HCC)  09/21/2018    Past Surgical History:  Procedure Laterality Date   COLONOSCOPY WITH PROPOFOL N/A 04/17/2019   Procedure: COLONOSCOPY WITH PROPOFOL;  Surgeon: Midge Minium, MD;   Location: [facility] ENDOSCOPY;  Service: Endoscopy;  Laterality: N/A;   ESOPHAGOGASTRODUODENOSCOPY N/A 03/12/2020   Procedure: ESOPHAGOGASTRODUODENOSCOPY (EGD);  Surgeon: Corliss Skains, MD;  Location[facility] OR;  Service: Thoracic;  Laterality: N/A;   ESOPHAGOGASTRODUODENOSCOPY (EGD) WITH PROPOFOL N/A 04/17/2019   Procedure: ESOPHAGOGASTRODUODENOSCOPY (EGD) WITH PROPOFOL;  Surgeon: Midge Minium, MD;  Location: [facility] ENDOSCOPY;  Service: Endoscopy;  Laterality: N/A;   GIVENS CAPSULE STUDY  05/2019   negative   VIDEO ASSISTED THORACOSCOPY (VATS)/DECORTICATION Right 03/12/2020   Procedure: VIDEO ASSISTED THORACOSCOPY (VATS)/DRAINING OF AN EMPYEMA;  Surgeon: Corliss Skains, MD;  Location: [facility] OR;  Service: Thoracic;  Laterality: Right;   XI ROBOTIC ASSISTED HIATAL HERNIA REPAIR N/A 03/12/2020   Procedure: XI ROBOTIC ASSISTED HIATAL HERNIA REPAIR USING ACELL MATRIX HIATAL;  Surgeon: Corliss Skains, MD;  Location: [facility] OR;  Service: Thoracic;  Laterality: N/A;    Family History  Problem Relation Age of Onset   Hypertension Mother    Thyroid disease Mother    Stroke Father 86   Liver cancer Maternal Uncle    Heart disease Brother    Hypertension Brother     Social History   Socioeconomic History   Marital status: Married    Spouse name: [place]   Number of children: Not on file   Years of education: 12   Highest education level: Some college, no degree  Occupational History   Not on file  Tobacco Use   Smoking status: Never   Smokeless tobacco: Never  Vaping Use   Vaping status: Never Used  Substance and Sexual Activity   Alcohol use: Yes    Alcohol/week: 15.0 standard drinks of alcohol    Types: 15 Standard drinks or equivalent per week    Comment: quit ETOH 03/12/2020   Drug use: Not Currently    Types: Marijuana    Comment: gummy, last week   Sexual activity: Not Currently  Other Topics Concern   Not on file  Social History Narrative   alcohol use- cut back April 2021.   Lives with his wife at home.   No lung dx hx, grew up with second hand smoke, wife smokes out of home   Social Determinants of Health   Financial Resource Strain: Low Risk  (10/18/2023)   Overall Financial Resource Strain (CARDIA)    Difficulty of Paying Living Expenses: Not hard at all  Food Insecurity: No Food Insecurity (10/18/2023)   Hunger Vital Sign    Worried About Running Out of Food in the Last Year: Never true    Ran Out of Food in the Last Year: Never true  Transportation Needs: No Transportation Needs (10/18/2023)   PRAPARE - Administrator, Civil Service (Medical): No    Lack of Transportation (Non-Medical): No  Physical Activity: Sufficiently Active (10/18/2023)   Exercise Vital Sign    Days of Exercise per Week: 5 days    Minutes of Exercise per Session: 30 min  Stress: No Stress Concern Present (10/18/2023)   [employer] of Occupational Health - Occupational Stress Questionnaire    Feeling of Stress : Not at all  Social Connections: Moderately Isolated (10/18/2023)   Social Connection and Isolation Panel [NHANES]    Frequency of Communication with Friends and Family: More than three times a week    Frequency of  Social Gatherings with Friends and Family: More than three times a week    Attends Religious Services: Never    Database administrator or Organizations: No    Attends Banker Meetings: Never    Marital Status: Married  Catering manager Violence: Not At Risk (10/18/2023)   Humiliation, Afraid, Rape, and Kick questionnaire    Fear of Current or Ex-Partner: No    Emotionally Abused: No    Physically Abused: No    Sexually Abused: No     Current Outpatient Medications:    levothyroxine (SYNTHROID) 112 MCG tablet, Take 1 tablet (112 mcg total) by mouth daily before breakfast., Disp: 90 tablet, Rfl: 1   metoprolol tartrate (LOPRESSOR) 100 MG tablet, Take 1 tablet (100 mg total) by mouth 2 (two) times daily., Disp: 180 tablet,  Rfl: 1   olmesartan-hydrochlorothiazide (BENICAR HCT) 20-12.5 MG tablet, Take 1 tablet by mouth daily., Disp: 90 tablet, Rfl: 1   omeprazole (PRILOSEC) 20 MG capsule, Take 1 capsule (20 mg total) by mouth at bedtime., Disp: 90 capsule, Rfl: 3  No Known Allergies   Review of Systems  Constitutional:  Negative for chills, fever, malaise/fatigue and weight loss.  HENT:  Negative for hearing loss, nosebleeds, sore throat and tinnitus.   Eyes:  Negative for blurred vision, double vision and photophobia.  Respiratory:  Negative for cough, shortness of breath and wheezing.   Cardiovascular:  Negative for chest pain, palpitations and leg swelling.  Gastrointestinal:  Positive for blood in stool (reports intermittent bright red blood. Has been told he has hemorrhoids). Negative for constipation, diarrhea, heartburn, nausea and vomiting.  Genitourinary:  Negative for dysuria and frequency.  Musculoskeletal:  Negative for falls, joint pain and myalgias.  Skin:  Negative for itching and rash.  Neurological:  Negative for dizziness, tingling, tremors, loss of consciousness, weakness and headaches.  Psychiatric/Behavioral:  Negative for depression, memory loss and suicidal ideas. The patient is not nervous/anxious and does not have insomnia.        Objective  Vitals:   10/18/23 1515  BP: 124/84  Pulse: 89  Resp: 16  Temp: (!) 97.3 F (36.3 C)  TempSrc: Temporal  SpO2: 98%  Weight: 236 lb 11.2 oz (107.4 kg)  Height: 5\' 9"  (1.753 m)    Body mass index is 34.95 kg/m.  Physical Exam Vitals reviewed.  Constitutional:      General: He is awake.     Appearance: Normal appearance. He is well-developed and well-groomed.  HENT:     Head: Normocephalic and atraumatic.     Right Ear: Hearing, tympanic membrane and ear canal normal.     Left Ear: Hearing, tympanic membrane and ear canal normal.     Nose: Nose normal.     Mouth/Throat:     Lips: Pink.     Mouth: Mucous membranes are moist.  No lacerations or oral lesions.     Pharynx: Oropharynx is clear. Uvula midline. No pharyngeal swelling, oropharyngeal exudate or posterior oropharyngeal erythema.  Eyes:     General: Lids are normal. Gaze aligned appropriately.     Extraocular Movements: Extraocular movements intact.     Right eye: Normal extraocular motion and no nystagmus.     Left eye: Normal extraocular motion and no nystagmus.     Conjunctiva/sclera: Conjunctivae normal.     Pupils: Pupils are equal, round, and reactive to light.  Neck:     Thyroid: No thyroid mass, thyromegaly or thyroid tenderness.     Trachea:  Phonation normal.  Cardiovascular:     Rate and Rhythm: Normal rate. Rhythm irregularly irregular.     Pulses: Normal pulses.          Radial pulses are 2+ on the right side and 2+ on the left side.     Heart sounds: Normal heart sounds. No murmur heard.    No friction rub. No gallop.  Pulmonary:     Effort: Pulmonary effort is normal.     Breath sounds: Normal breath sounds. No decreased air movement. No decreased breath sounds, wheezing, rhonchi or rales.  Abdominal:     General: Abdomen is flat. Bowel sounds are normal.     Palpations: Abdomen is soft.     Tenderness: There is no abdominal tenderness.  Musculoskeletal:     Cervical back: Normal range of motion and neck supple.     Right lower leg: 1+ Pitting Edema present.     Left lower leg: 1+ Pitting Edema present.  Lymphadenopathy:     Head:     Right side of head: No submental, submandibular or preauricular adenopathy.     Left side of head: No submental, submandibular or preauricular adenopathy.     Cervical: No cervical adenopathy.     Right cervical: No superficial or posterior cervical adenopathy.    Left cervical: No superficial or posterior cervical adenopathy.     Upper Body:     Right upper body: No supraclavicular adenopathy.     Left upper body: No supraclavicular adenopathy.  Skin:    General: Skin is warm and dry.   Neurological:     General: No focal deficit present.     Mental Status: He is alert and oriented to person, place, and time. Mental status is at baseline.     GCS: GCS eye subscore is 4. GCS verbal subscore is 5. GCS motor subscore is 6.     Cranial Nerves: No cranial nerve deficit, dysarthria or facial asymmetry.     Motor: No weakness, tremor, atrophy or abnormal muscle tone.     Gait: Gait is intact.     Deep Tendon Reflexes:     Reflex Scores:      Patellar reflexes are 2+ on the right side and 2+ on the left side. Psychiatric:        Attention and Perception: Attention and perception normal.        Mood and Affect: Mood and affect normal.        Speech: Speech normal.        Behavior: Behavior normal. Behavior is cooperative.        Thought Content: Thought content normal.        Cognition and Memory: Cognition normal.        Judgment: Judgment normal.      Recent Results (from the past 2160 hour(s))  TSH     Status: None   Collection Time: 08/23/23  3:11 PM  Result Value Ref Range   TSH 2.53 0.40 - 4.50 mIU/L  COMPLETE METABOLIC PANEL WITH GFR     Status: Abnormal   Collection Time: 08/23/23  3:11 PM  Result Value Ref Range   Glucose, Bld 94 65 - 99 mg/dL    Comment: .            Fasting reference interval .    BUN 14 7 - 25 mg/dL   Creat 0.98 1.19 - 1.47 mg/dL   eGFR 92 > OR = 60 WG/NFA/2.13Y8   BUN/Creatinine  Ratio SEE NOTE: 6 - 22 (calc)    Comment:    Not Reported: BUN and Creatinine are within    reference range. .    Sodium 133 (L) 135 - 146 mmol/L   Potassium 4.5 3.5 - 5.3 mmol/L   Chloride 95 (L) 98 - 110 mmol/L   CO2 29 20 - 32 mmol/L   Calcium 9.4 8.6 - 10.3 mg/dL   Total Protein 7.2 6.1 - 8.1 g/dL   Albumin 4.1 3.6 - 5.1 g/dL   Globulin 3.1 1.9 - 3.7 g/dL (calc)   AG Ratio 1.3 1.0 - 2.5 (calc)   Total Bilirubin 1.4 (H) 0.2 - 1.2 mg/dL   Alkaline phosphatase (APISO) 48 35 - 144 U/L   AST 15 10 - 35 U/L   ALT 16 9 - 46 U/L     Fall Risk:     10/18/2023    3:07 PM 08/23/2023    2:52 PM 04/12/2023    3:17 PM 09/21/2022    3:20 PM 03/22/2022    3:01 PM  Fall Risk   Falls in the past year? 0 0 0 0 0  Number falls in past yr: 0 0 0 0 0  Injury with Fall? 0 0 0 0 0  Risk for fall due to : No Fall Risks No Fall Risks No Fall Risks No Fall Risks   Follow up Falls prevention discussed;Education provided;Falls evaluation completed Falls prevention discussed;Education provided;Falls evaluation completed Falls prevention discussed;Education provided;Falls evaluation completed Falls prevention discussed;Education provided Falls evaluation completed     Functional Status Survey: Is the patient deaf or have difficulty hearing?: No Does the patient have difficulty seeing, even when wearing glasses/contacts?: No Does the patient have difficulty concentrating, remembering, or making decisions?: No Does the patient have difficulty walking or climbing stairs?: No Does the patient have difficulty dressing or bathing?: No Does the patient have difficulty doing errands alone such as visiting a doctor's office or shopping?: No    Assessment & Plan  Problem List Items Addressed This Visit       Other   Hyperlipidemia   Relevant Orders   Lipid panel   Class 2 severe obesity with serious comorbidity and body mass index (BMI) of 35.0 to 35.9 in adult [facility])   Relevant Orders   Hemoglobin A1c   Advanced care planning/counseling discussion    A voluntary discussion about advance care planning including the explanation and discussion of advance directives was extensively discussed  with the patient for 5  minutes with patient and myself  present.  Explanation about the health care proxy and Living will was reviewed and packet with forms with explanation of how to fill them out was given.  During this discussion, the patient was able to identify a health care proxy as  his wife  and plans to fill out the paperwork required.  Patient was offered a  separate Advance Care Planning visit for further assistance with forms.         Other Visit Diagnoses     Annual physical exam    -  Primary   Relevant Orders   PSA   TSH   Hemoglobin A1c   Lipid panel   CBC with Differential/Platelet   COMPLETE METABOLIC PANEL WITH GFR   Screening for prostate cancer       Relevant Orders   PSA     -Prostate cancer screening and PSA options (with potential risks and benefits of testing vs not testing) were  discussed along with recent recs/guidelines. -USPSTF grade A and B recommendations reviewed with patient; age-appropriate recommendations, preventive care, screening tests, etc discussed and encouraged; healthy living encouraged; see AVS for patient education given to patient -Discussed importance of 150 minutes of physical activity weekly, eat two servings of fish weekly, eat one serving of tree nuts ( cashews, pistachios, pecans, almonds.Marland Kitchen) every other day, eat 6 servings of fruit/vegetables daily and drink plenty of water and avoid sweet beverages.  -Reviewed Health Maintenance: yes   Return in about 3 months (around 01/18/2024) for HTN, HLD, a fib. Maurie Boettcher Evey Mcmahan, PA-C, have reviewed all documentation for this visit. The documentation on 10/18/23 for the exam, diagnosis, procedures, and orders are all accurate and complete.   Jacquelin Hawking, MHS, PA-C [facility]

## 2023-10-18 NOTE — Patient Instructions (Signed)
  Please try to wear compression stockings. Use your shoe size and then measure around the widest part of your calf to get the appropriate size stocking I typically recommend 15-25 mmHg compression force for daily wear. Please put these on first thing in the morning and wear until the evening time.  These should not be painful or cut into your legs but they may be pretty snug. If they cause pain, please take them off.

## 2023-10-19 LAB — CBC WITH DIFFERENTIAL/PLATELET
Absolute Lymphocytes: 1001 {cells}/uL (ref 850–3900)
Absolute Monocytes: 832 {cells}/uL (ref 200–950)
Basophils Absolute: 62 {cells}/uL (ref 0–200)
Basophils Relative: 0.8 %
Eosinophils Absolute: 239 {cells}/uL (ref 15–500)
Eosinophils Relative: 3.1 %
HCT: 46 % (ref 38.5–50.0)
Hemoglobin: 15.5 g/dL (ref 13.2–17.1)
MCH: 30.6 pg (ref 27.0–33.0)
MCHC: 33.7 g/dL (ref 32.0–36.0)
MCV: 90.9 fL (ref 80.0–100.0)
MPV: 9.9 fL (ref 7.5–12.5)
Monocytes Relative: 10.8 %
Neutro Abs: 5567 {cells}/uL (ref 1500–7800)
Neutrophils Relative %: 72.3 %
Platelets: 222 10*3/uL (ref 140–400)
RBC: 5.06 10*6/uL (ref 4.20–5.80)
RDW: 12.8 % (ref 11.0–15.0)
Total Lymphocyte: 13 %
WBC: 7.7 10*3/uL (ref 3.8–10.8)

## 2023-10-19 LAB — HEMOGLOBIN A1C
Hgb A1c MFr Bld: 5.6 %{Hb} (ref <5.7)
Mean Plasma Glucose: 114 mg/dL
eAG (mmol/L): 6.3 mmol/L

## 2023-10-19 LAB — COMPLETE METABOLIC PANEL WITH GFR
AG Ratio: 1.4 (calc) (ref 1.0–2.5)
ALT: 17 U/L (ref 9–46)
AST: 15 U/L (ref 10–35)
Albumin: 4.2 g/dL (ref 3.6–5.1)
Alkaline phosphatase (APISO): 45 U/L (ref 35–144)
BUN: 9 mg/dL (ref 7–25)
CO2: 27 mmol/L (ref 20–32)
Calcium: 9.4 mg/dL (ref 8.6–10.3)
Chloride: 95 mmol/L — ABNORMAL LOW (ref 98–110)
Creat: 0.91 mg/dL (ref 0.70–1.30)
Globulin: 3 g/dL (ref 1.9–3.7)
Glucose, Bld: 104 mg/dL — ABNORMAL HIGH (ref 65–99)
Potassium: 4.6 mmol/L (ref 3.5–5.3)
Sodium: 132 mmol/L — ABNORMAL LOW (ref 135–146)
Total Bilirubin: 1.5 mg/dL — ABNORMAL HIGH (ref 0.2–1.2)
Total Protein: 7.2 g/dL (ref 6.1–8.1)
eGFR: 100 mL/min/{1.73_m2} (ref >=60)

## 2023-10-19 LAB — LIPID PANEL
Cholesterol: 184 mg/dL (ref <200)
HDL: 56 mg/dL (ref >=40)
LDL Cholesterol (Calc): 109 mg/dL — ABNORMAL HIGH
Non-HDL Cholesterol (Calc): 128 mg/dL (ref <130)
Total CHOL/HDL Ratio: 3.3 (calc) (ref <5.0)
Triglycerides: 96 mg/dL (ref <150)

## 2023-10-19 LAB — TSH: TSH: 4.11 m[IU]/L (ref 0.40–4.50)

## 2023-10-19 LAB — PSA: PSA: 1.78 ng/mL (ref <=4.00)

## 2023-10-20 LAB — SURGICAL PATHOLOGY

## 2023-10-20 NOTE — Progress Notes (Signed)
Your labs are back Your cholesterol is overall good. Your LDL or "bad" cholesterol is a bit elevated. I recommend reducing saturated fats in your diet and exercising to manage this for now Your liver and kidney function are overall normal. Your sodium and chloride are a bit low and your glucose was a bit elevated. Please make sure you are eating a well balanced diet and eating regular meals throughout the day Your prostate testing was normal Your thyroid testing was normal  Your A1c was 5.6 % which was in normal range but on the upper limit of normal. Please reduce your sugar intake to help prevent progression to prediabetes Your CBC was normal- no signs of anemia Please let us know if you have questions or concerns.Please continue with your current medication regimen as directed

## 2023-10-24 ENCOUNTER — Telehealth: Payer: Self-pay

## 2023-10-24 NOTE — Telephone Encounter (Signed)
-----   Message from [facility] sent at 10/20/2023 10:48 AM EST ----- BCC- left temple- Mohs with me   Please call patient to discuss diagnosis and schedule for Mohs surgery.

## 2023-10-24 NOTE — Telephone Encounter (Signed)
Spoke with pt and gave him bx results and treatment recommendaton

## 2023-11-22 ENCOUNTER — Encounter: Payer: Self-pay | Admitting: Dermatology

## 2023-11-23 ENCOUNTER — Encounter: Payer: Self-pay | Admitting: Dermatology

## 2023-11-23 ENCOUNTER — Ambulatory Visit: Payer: Medicaid Other | Admitting: Dermatology

## 2023-11-23 VITALS — BP 123/83 | HR 80

## 2023-11-23 DIAGNOSIS — L720 Epidermal cyst: Secondary | ICD-10-CM

## 2023-11-23 DIAGNOSIS — D492 Neoplasm of unspecified behavior of bone, soft tissue, and skin: Secondary | ICD-10-CM

## 2023-11-23 DIAGNOSIS — D485 Neoplasm of uncertain behavior of skin: Secondary | ICD-10-CM

## 2023-11-23 DIAGNOSIS — C44310 Basal cell carcinoma of skin of unspecified parts of face: Secondary | ICD-10-CM

## 2023-11-23 DIAGNOSIS — R2232 Localized swelling, mass and lump, left upper limb: Secondary | ICD-10-CM

## 2023-11-23 DIAGNOSIS — R229 Localized swelling, mass and lump, unspecified: Secondary | ICD-10-CM

## 2023-11-23 NOTE — Patient Instructions (Signed)

## 2023-11-23 NOTE — Progress Notes (Signed)
Follow-Up Visit   Subjective  Dean Anderson is a 57 y.o. male who presents for the following: Excision of a neoplasm of skin on the right forehead, present for several years.   He also has a Basal Cell Carcinoma on the left forehead, for which he is scheduled for Mohs in the next week.  The following portions of the chart were reviewed this encounter and updated as appropriate: medications, allergies, medical history  Review of Systems:  No other skin or systemic complaints except as noted in HPI or Assessment and Plan.  Objective  Well appearing patient in no apparent distress; mood and affect are within normal limits.  A focused examination was performed of the following areas: right forehead Relevant physical exam findings are noted in the Assessment and Plan.   Right Forehead Firm nodule   Assessment & Plan   NEOPLASM OF UNCERTAIN BEHAVIOR OF SKIN Right Forehead Skin excision  Excision method:  elliptical Lesion length (cm):  3.3 Lesion width (cm):  3 Margin per side (cm):  0.1 Total excision diameter (cm):  3.5 Informed consent: discussed and consent obtained   Timeout: patient name, date of birth, surgical site, and procedure verified   Procedure prep:  Patient was prepped and draped in usual sterile fashion Prep type:  Chlorhexidine Anesthesia: the lesion was anesthetized in a standard fashion   Anesthetic:  1% lidocaine w/ epinephrine 1-100,000 buffered w/ 8.4% NaHCO3 Instrument used: #15 blade   Hemostasis achieved with: suture, pressure and electrodesiccation   Hemostasis achieved with comment:  4.0 vicryl, 5.0 fast Outcome: patient tolerated procedure well with no complications   Post-procedure details: sterile dressing applied and wound care instructions given   Dressing type: bacitracin and pressure dressing   Additional details:  Final length 4.3  Skin repair Complexity:  Complex Final length (cm):  4.3 Timeout: patient name, date of birth, surgical  site, and procedure verified   Procedure prep:  Patient was prepped and draped in usual sterile fashion Prep type:  Chlorhexidine Anesthesia: the lesion was anesthetized in a standard fashion   Anesthetic:  1% lidocaine w/ epinephrine 1-100,000 buffered w/ 8.4% NaHCO3 Reason for type of repair: reduce tension to allow closure, allow closure of the large defect, preserve normal anatomy, avoid adjacent structures and compensate for the inelasticity of skin in this area   Undermining: area extensively undermined   Subcutaneous layers (deep stitches):  Suture size:  4-0 Suture type: Vicryl (polyglactin 910)   Stitches:  Buried vertical mattress Fine/surface layer approximation (top stitches):  Suture size:  5-0 Suture type: fast-absorbing plain gut   Stitches: simple running   Hemostasis achieved with: suture, pressure and electrodesiccation Outcome: patient tolerated procedure well with no complications   Post-procedure details: sterile dressing applied and wound care instructions given   Dressing type: bacitracin and pressure dressing   Specimen 1 - Surgical pathology Differential Diagnosis: R/O cyst vs lipoma vs other  Check Margins: No SUBCUTANEOUS NODULE   BASAL CELL CARCINOMA (BCC) OF SKIN OF FACE, UNSPECIFIED PART OF FACE    The surgical wound was then cleaned, prepped, and re-anesthetized as above. Wound edges were undermined extensively along at least one entire edge and at a distance equal to or greater than the width of the defect (see wound defect size above) in order to achieve closure and decrease wound tension and anatomic distortion. Redundant tissue repair including standing cone removal was performed. Hemostasis was achieved with electrocautery. Subcutaneous and epidermal tissues were approximated with the above  sutures. The surgical site was then lightly scrubbed with sterile, saline-soaked gauze. The area was then bandaged using Vaseline ointment, non-adherent gauze,  gauze pads, and tape to provide an adequate pressure dressing. The patient tolerated the procedure well, was given detailed written and verbal wound care instructions, and was discharged in good condition.   The patient will follow-up: 1 week.  Subcutaneous nodule on left dorsal wrist - Re-discussed that patient will need to follow through with imaging with ultrasound - Lesion appears fixed and will likely need hand surgery for removal - [place] order in chart  Basal Cell Carcinoma- left temple - Bxed - Scheduled for Mohs  Return in about 1 week (around 11/30/2023).  I, Tillie Fantasia, CMA, am acting as scribe for Gwenith Daily, MD.   Documentation: I have reviewed the above documentation for accuracy and completeness, and I agree with the above.  Gwenith Daily, MD

## 2023-11-24 ENCOUNTER — Encounter: Payer: Medicaid Other | Admitting: Dermatology

## 2023-11-24 LAB — SURGICAL PATHOLOGY

## 2023-11-29 ENCOUNTER — Encounter: Payer: Self-pay | Admitting: Dermatology

## 2023-12-01 ENCOUNTER — Ambulatory Visit: Payer: Medicaid Other | Admitting: Dermatology

## 2023-12-01 ENCOUNTER — Encounter: Payer: Self-pay | Admitting: Dermatology

## 2023-12-01 ENCOUNTER — Telehealth: Payer: Self-pay

## 2023-12-01 VITALS — BP 109/70 | HR 82 | Temp 98.3°F

## 2023-12-01 DIAGNOSIS — L579 Skin changes due to chronic exposure to nonionizing radiation, unspecified: Secondary | ICD-10-CM

## 2023-12-01 DIAGNOSIS — L814 Other melanin hyperpigmentation: Secondary | ICD-10-CM

## 2023-12-01 DIAGNOSIS — L905 Scar conditions and fibrosis of skin: Secondary | ICD-10-CM

## 2023-12-01 DIAGNOSIS — C44310 Basal cell carcinoma of skin of unspecified parts of face: Secondary | ICD-10-CM | POA: Diagnosis not present

## 2023-12-01 DIAGNOSIS — C4491 Basal cell carcinoma of skin, unspecified: Secondary | ICD-10-CM

## 2023-12-01 NOTE — Patient Instructions (Signed)

## 2023-12-01 NOTE — Telephone Encounter (Signed)
-----   Message from [facility] PACI sent at 12/01/2023 10:01 AM EST ----- Results: Lesion on right forehead results showed EIC. Reassured patient on benign nature. OK to follow up in 6-12 months  Discussed with patient in person on 12/01/2023

## 2023-12-01 NOTE — Progress Notes (Signed)
Follow-Up Visit   Subjective  Dean Anderson is a 57 y.o. male who presents for the following: Mohs of a Basal Cell Carcinoma of the left temple, referred by Dr. Caralyn Guile.   Patient is also s/p WLE for a larger EIC on his right forehead, treated on 11/23/2023.  The following portions of the chart were reviewed this encounter and updated as appropriate: medications, allergies, medical history  Review of Systems:  No other skin or systemic complaints except as noted in HPI or Assessment and Plan.  Objective  Well appearing patient in no apparent distress; mood and affect are within normal limits.  A focused examination was performed of the following areas: Left temple Relevant physical exam findings are noted in the Assessment and Plan.     Assessment & Plan   BASAL CELL CARCINOMA (BCC), UNSPECIFIED SITE Left Temple Mohs surgery   Procedure Details: Timeout: pre-procedure verification complete Procedure Prep: patient was prepped and draped in usual sterile fashion Prep type: chlorhexidine Pre-Op diagnosis: basal cell carcinoma Surgical site (from skin exam): Left Temple Pre-operative length (cm): 1.2 Pre-operative width (cm): 1.5 Indications for Mohs surgery: anatomic location where tissue conservation is critical  Micrographic Surgery Details: Post-operative length (cm): 1.7 Post-operative width (cm): 1.3 Number of Mohs stages: 2  Skin repair Complexity:  Complex Final length (cm):  4.5 Timeout: patient name, date of birth, surgical site, and procedure verified   Procedure prep:  Patient was prepped and draped in usual sterile fashion Prep type:  Chlorhexidine Anesthesia: the lesion was anesthetized in a standard fashion   Anesthetic:  1% lidocaine w/ epinephrine 1-100,000 buffered w/ 8.4% NaHCO3 Reason for type of repair: reduce tension to allow closure, allow closure of the large defect, preserve normal anatomy, allow side-to-side closure without requiring a flap or  graft and compensate for the inelasticity of skin in this area   Undermining: area extensively undermined   Subcutaneous layers (deep stitches):  Suture size:  5-0 Suture type: Monocryl (poliglecaprone 25)   Stitches:  Buried vertical mattress Fine/surface layer approximation (top stitches):  Suture size:  6-0 Suture type: fast-absorbing plain gut   Stitches: simple running   Hemostasis achieved with: suture, pressure and electrodesiccation Outcome: patient tolerated procedure well with no complications   Post-procedure details: sterile dressing applied and wound care instructions given   Dressing type: bacitracin and pressure dressing    The surgical wound was then cleaned, prepped, and re-anesthetized as above. Wound edges were undermined extensively along at least one entire edge and at a distance equal to or greater than the width of the defect (see wound defect size above) in order to achieve closure and decrease wound tension and anatomic distortion. Redundant tissue repair including standing cone removal was performed. Hemostasis was achieved with electrocautery. Subcutaneous and epidermal tissues were approximated with the above sutures. The surgical site was then lightly scrubbed with sterile, saline-soaked gauze. The area was then bandaged using Vaseline ointment, non-adherent gauze, gauze pads, and tape to provide an adequate pressure dressing. The patient tolerated the procedure well, was given detailed written and verbal wound care instructions, and was discharged in good condition.   Scar s/p WLE for EIC on right forehead, treated on 11/23/2023, repaired with complex linear closure - Reassured that wound has healed well - Discussed that scars take up to 12 months to mature from the date of surgery - Recommend SPF 30+ to scar daily to prevent purple color - OK to start scar massage at 4-6 weeks post-op -  Can consider silicone based products for scar healing  The patient will  follow-up: 4 weeks.  Return in about 4 weeks (around 12/29/2023) for wound check.  Owens Shark, CMA, am acting as scribe for Gwenith Daily, MD.    12/01/2023  HISTORY OF PRESENT ILLNESS  Dean Anderson is seen in consultation at the request of Dr. Caralyn Guile for biopsy-proven Nodular Basal Cell Carcinoma of the left forehead. They note that the area has been present for about 1.2 x 1.5 increasing in size with time.  There is no history of previous treatment.  Reports no other new or changing lesions and has no other complaints today.  Medications and allergies: see patient chart.  Review of systems: Reviewed 8 systems and notable for the above skin cancer.  All other systems reviewed are unremarkable/negative, unless noted in the HPI. Past medical history, surgical history, family history, social history were also reviewed and are noted in the chart/questionnaire.    PHYSICAL EXAMINATION  General: Well-appearing, in no acute distress, alert and oriented x 4. Vitals reviewed in chart (if available).   Skin: Exam reveals a 1.2 x 1.5 cm erythematous papule and biopsy scar on the left forehead. There are rhytids, telangiectasias, and lentigines, consistent with photodamage.   Biopsy report(s) reviewed, confirming the diagnosis.   ASSESSMENT  1) Nodular Basal Cell Carcinoma of the left temple 2) photodamage 3) solar lentigines   PLAN   1. Due to location, size, histology, or recurrence and the likelihood of subclinical extension as well as the need to conserve normal surrounding tissue, the patient was deemed acceptable for Mohs micrographic surgery (MMS).  The nature and purpose of the procedure, associated benefits and risks including recurrence and scarring, possible complications such as pain, infection, and bleeding, and alternative methods of treatment if appropriate were discussed with the patient during consent. The lesion location was verified by the patient, by reviewing previous  notes, pathology reports, and by photographs as well as angulation measurements if available.  Informed consent was reviewed and signed by the patient, and timeout was performed at 9:30 AM. See op note below.  2. For the photodamage and solar lentigines, sun protection discussed/information given on OTC sunscreens, and we recommend continued regular follow-up with primary dermatologist every 6 months or sooner for any growing, bleeding, or changing lesions. 3. Prognosis and future surveillance discussed. 4. Letter with treatment outcome sent to referring provider. 5. Pain acetaminophen/ibuprofen   MOHS MICROGRAPHIC SURGERY AND RECONSTRUCTION  Initial size:   1.2 x 1.5 cm Surgical defect/wound size: 1.7 x 1.3 cm Anesthesia:    0.33% lidocaine with 1:200,000 epinephrine EBL:    <5 mL Complications:  None Repair type:   Complex SQ suture:   5-0 Monocryl Cutaneous suture:  6-0 Plain gut Final size of the repair: 4.5 cm  Stages: 2  STAGE I: Anesthesia achieved with 0.5% lidocaine with 1:200,000 epinephrine. ChloraPrep applied. 1 section(s) excised using Mohs technique (this includes total peripheral and deep tissue margin excision and evaluation with frozen sections, excised and interpreted by the same physician). The tumor was first debulked and then excised with an approx. 2 mm margin.  Hemostasis was achieved with electrocautery as needed.  The specimen was then oriented, subdivided/relaxed, inked, and processed using Mohs technique.    Frozen section analysis revealed a positive margin for islands of cells with peripheral palisading and a haphazard arrangement of the more central cells in the deep margin.    STAGE II: An additional 2 mm  margin was excised.  Hemostasis was achieved with electrocautery as needed.  The specimen was then oriented, subdivided/relaxed, inked, and processed using Mohs technique. Evaluation of slides by the Mohs surgeon revealed clear tumor  margins.  Reconstruction  The surgical wound was then cleaned, prepped, and re-anesthetized as above. Wound edges were undermined extensively along at least one entire edge and at a distance equal to or greater than the width of the defect (see wound defect size above) in order to achieve closure and decrease wound tension and anatomic distortion. Redundant tissue repair including standing cone removal was performed. Hemostasis was achieved with electrocautery. Subcutaneous and epidermal tissues were approximated with the above sutures. The surgical site was then lightly scrubbed with sterile, saline-soaked gauze. The area was then bandaged using Vaseline ointment, non-adherent gauze, gauze pads, and tape to provide an adequate pressure dressing. The patient tolerated the procedure well, was given detailed written and verbal wound care instructions, and was discharged in good condition.   The patient will follow-up: 4 weeks.   Documentation: I have reviewed the above documentation for accuracy and completeness, and I agree with the above.  Gwenith Daily, MD

## 2023-12-01 NOTE — Telephone Encounter (Signed)
Pt given bx results at visit

## 2023-12-02 ENCOUNTER — Telehealth: Payer: Self-pay | Admitting: Dermatology

## 2023-12-02 ENCOUNTER — Encounter: Payer: Self-pay | Admitting: Dermatology

## 2023-12-02 NOTE — Telephone Encounter (Signed)
-----   Message from [facility] PACI sent at 12/01/2023 10:01 AM EST ----- Results: Lesion on right forehead results showed EIC. Reassured patient on benign nature. OK to follow up in 6-12 months  Discussed with patient in person on 12/01/2023

## 2023-12-02 NOTE — Telephone Encounter (Signed)
Called and spoke to the pt to let him know the spot was benign and no further trearment needed.

## 2023-12-23 ENCOUNTER — Encounter: Payer: Self-pay | Admitting: Cardiology

## 2023-12-23 ENCOUNTER — Ambulatory Visit: Payer: Medicaid Other | Attending: Cardiology | Admitting: Cardiology

## 2023-12-23 VITALS — BP 122/90 | HR 100 | Ht 68.0 in | Wt 240.0 lb

## 2023-12-23 DIAGNOSIS — I4819 Other persistent atrial fibrillation: Secondary | ICD-10-CM | POA: Diagnosis not present

## 2023-12-23 DIAGNOSIS — I10 Essential (primary) hypertension: Secondary | ICD-10-CM

## 2023-12-23 NOTE — Progress Notes (Signed)
Cardiology Office Note:    Date:  12/23/2023   ID:  Dean Anderson, DOB 1966/07/04, MRN [id number]  PCP:  Danelle Berry, PA-C  Cardiologist:  Debbe Odea, MD  Electrophysiologist:  None   Referring MD: Danelle Berry, PA-C   Chief Complaint  Patient presents with   Follow-up    Patient denies new or acute cardiac problems/concerns today.      History of Present Illness:    Dean Anderson is a 58 y.o. male with a hx of persistent A. Fib (not on anticoagulation due to GI bleeds.), hypertension, OSA who presents for follow-up.  Denies chest pain or shortness of breath.  Denies palpitations or dizziness.  His previous gastroenterologist retired, waiting for appointment with new gastroenterology.  Denies any episode of bleeding off anticoagulation.  Overall feels okay, BP well-controlled.  Prior notes Echo 04/2020 showed normal systolic function, EF 60 to 65%, moderate LA dilatation, aortic valve sclerosis. Lexiscan Myoview 02/2020 no evidence for ischemia, low risk scan.  Since diagnosis of A. fib around 2019, he was  put on blood thinners but subsequently developed an anemia.  GI work-up has since been unrevealing only hemorrhoidal bleeding as possible etiology.  He states noticing blood in his stool sometimes.   History of dyspnea status post VATS draining of empyema and decortication, hiatal hernia repair.    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 VATS   Thyroid disease     Past Surgical History:  Procedure Laterality Date   COLONOSCOPY WITH PROPOFOL N/A 04/17/2019   Procedure: COLONOSCOPY WITH PROPOFOL;  Surgeon: Midge Minium, MD;  Location: [facility] ENDOSCOPY;  Service: Endoscopy;  Laterality: N/A;   ESOPHAGOGASTRODUODENOSCOPY N/A 03/12/2020   Procedure: ESOPHAGOGASTRODUODENOSCOPY (EGD);  Surgeon: Corliss Skains, MD;  Location: [facility] OR;  Service: Thoracic;  Laterality: N/A;   ESOPHAGOGASTRODUODENOSCOPY (EGD) WITH PROPOFOL N/A 04/17/2019   Procedure: ESOPHAGOGASTRODUODENOSCOPY (EGD) WITH PROPOFOL;  Surgeon: Midge Minium, MD;  Location: [facility] ENDOSCOPY;  Service: Endoscopy;  Laterality: N/A;   GIVENS CAPSULE STUDY  05/2019   negative   VIDEO ASSISTED THORACOSCOPY (VATS)/DECORTICATION Right 03/12/2020   Procedure: VIDEO ASSISTED THORACOSCOPY (VATS)/DRAINING OF AN EMPYEMA;  Surgeon: Corliss Skains, MD;  Location: [facility] OR;  Service: Thoracic;  Laterality: Right;   XI ROBOTIC ASSISTED HIATAL HERNIA REPAIR N/A 03/12/2020   Procedure: XI ROBOTIC ASSISTED HIATAL HERNIA REPAIR USING ACELL MATRIX HIATAL;  Surgeon: Corliss Skains, MD;  Location: [facility] OR;  Service: Thoracic;  Laterality: N/A;    Current Medications: Current Meds  Medication Sig   levothyroxine (SYNTHROID) 112 MCG tablet TAKE 1 TABLET BY MOUTH ONCE DAILY BEFORE BREAKFAST   metoprolol tartrate (LOPRESSOR) 100 MG tablet Take 1 tablet (100 mg total) by mouth 2 (  two) times daily.   olmesartan-hydrochlorothiazide (BENICAR HCT) 20-12.5 MG tablet Take 1 tablet by mouth daily.   omeprazole (PRILOSEC) 20 MG capsule Take 1 capsule (20 mg total) by mouth at bedtime.     Allergies:   Patient has no known allergies.   Social History   Socioeconomic History   Marital status: Married    Spouse name: [place]   Number of children: Not on file   Years of education: 12   Highest  education level: Some college, no degree  Occupational History   Not on file  Tobacco Use   Smoking status: Never   Smokeless tobacco: Never  Vaping Use   Vaping status: Never Used  Substance and Sexual Activity   Alcohol use: Yes    Alcohol/week: 15.0 standard drinks of alcohol    Types: 15 Standard drinks or equivalent per week    Comment: quit ETOH 03/12/2020   Drug use: Not Currently    Types: Marijuana    Comment: gummy, last week   Sexual activity: Not Currently  Other Topics Concern   Not on file  Social History Narrative   alcohol use- cut back April 2021.  Lives with his wife at home.   No lung dx hx, grew up with second hand smoke, wife smokes out of home   Social Drivers of Health   Financial Resource Strain: Low Risk  (10/18/2023)   Overall Financial Resource Strain (CARDIA)    Difficulty of Paying Living Expenses: Not hard at all  Food Insecurity: No Food Insecurity (10/18/2023)   Hunger Vital Sign    Worried About Running Out of Food in the Last Year: Never true    Ran Out of Food in the Last Year: Never true  Transportation Needs: No Transportation Needs (10/18/2023)   PRAPARE - Administrator, Civil Service (Medical): No    Lack of Transportation (Non-Medical): No  Physical Activity: Sufficiently Active (10/18/2023)   Exercise Vital Sign    Days of Exercise per Week: 5 days    Minutes of Exercise per Session: 30 min  Stress: No Stress Concern Present (10/18/2023)   [employer] of Occupational Health - Occupational Stress Questionnaire    Feeling of Stress : Not at all  Social Connections: Moderately Isolated (10/18/2023)   Social Connection and Isolation Panel [NHANES]    Frequency of Communication with Friends and Family: More than three times a week    Frequency of Social Gatherings with Friends and Family: More than three times a week    Attends Religious Services: Never    Database administrator or Organizations: No    Attends Probation officer: Never    Marital Status: Married     Family History: The patient's family history includes Heart disease in his brother; Hypertension in his brother and mother; Liver cancer in his maternal uncle; Stroke (age of onset: 59) in his father; Thyroid disease in his mother.  ROS:   Please see the history of present illness.     All other systems reviewed and are negative.  EKGs/Labs/Other Studies Reviewed:    The following studies were reviewed today:   EKG Interpretation Date/Time:  Friday December 23 2023 14:45:49 EST Ventricular Rate:  100 PR Interval:    QRS Duration:  82 QT Interval:  350 QTC Calculation: 451 R Axis:   32  Text Interpretation: Atrial fibrillation Low voltage QRS Confirmed by Debbe Odea ([id number]) on 12/23/2023 3:05:05 PM    Recent  Labs: 10/18/2023: ALT 17; BUN 9; Creat 0.91; Hemoglobin 15.5; Platelets 222; Potassium 4.6; Sodium 132; TSH 4.11  Recent Lipid Panel    Component Value Date/Time   CHOL 184 10/18/2023 1545   CHOL 209 (H) 05/21/2020 1649   TRIG 96 10/18/2023 1545   TRIG 240 (H) 05/21/2020 1649   HDL 56 10/18/2023 1545   CHOLHDL 3.3 10/18/2023 1545   LDLCALC 109 (H) 10/18/2023 1545    Physical Exam:    VS:  BP (!) 122/90 (BP Location: Left Arm, Patient Position: Sitting, Cuff Size: Normal)   Pulse 100   Ht 5\' 8"  (1.727 m)   Wt 240 lb (108.9 kg)   SpO2 98%   BMI 36.49 kg/m     Wt Readings from Last 3 Encounters:  12/23/23 240 lb (108.9 kg)  10/18/23 236 lb 11.2 oz (107.4 kg)  08/23/23 235 lb 12.8 oz (107 kg)     GEN:  Well nourished, well developed in no acute distress HEENT: Normal NECK: No JVD; No carotid bruits CARDIAC: Irregular irregular, no murmurs, rubs, gallops RESPIRATORY: Decreased breath sounds at the right lung base, clear anteriorly ABDOMEN: Soft, non-tender, non-distended MUSCULOSKELETAL:  No edema; No deformity  SKIN: Warm and dry NEUROLOGIC:  Alert and oriented x 3 PSYCHIATRIC:   Normal affect   ASSESSMENT:    1. Persistent atrial fibrillation (HCC)   2. Primary hypertension     PLAN:    In order of problems listed above:  persistent atrial fibrillation.  CHA2DS2-VASc: 1(htn).  Clinically asymptomatic.  Continue Lopressor 100 mg twice daily.  Not on anticoagulation due to GI bleeding and anemia when anticoagulation was tried in the past. GI work-up so far has shown hemorrhoidal bleeding as only possible etiology.  Consider watchman for higher CHA2DS2-VASc score.  Refer to EP/A-fib clinic. hypertension, BP controlled, continue Lopressor, olmesartan 20mg -HCTZ 12.5 mg daily.    Follow-up in 12 months  Total encounter time minutes  Greater than 50% was spent in counseling and coordination of care with the patient Medication management.  This note was generated in part or whole with voice recognition software. Voice recognition is usually quite accurate but there are transcription errors that can and very often do occur. I apologize for any typographical errors that were not detected and corrected.  Medication Adjustments/Labs and Tests Ordered: Current medicines are reviewed at length with the patient today.  Concerns regarding medicines are outlined above.  Orders Placed This Encounter  Procedures   Ambulatory referral to Cardiac Electrophysiology   EKG 12-Lead    No orders of the defined types were placed in this encounter.    Patient Instructions  Medication Instructions:   Your physician recommends that you continue on your current medications as directed. Please refer to the Current Medication list given to you today.   *If you need a refill on your cardiac medications before your next appointment, please call your pharmacy*   Lab Work:  None Ordered  If you have labs (blood work) drawn today and your tests are completely normal, you will receive your results only by: MyChart Message (if you have MyChart) OR A paper copy in the  mail If you have any lab test that is abnormal or we need to change your treatment, we will call you to review the results.   Testing/Procedures:  None Ordered   Follow-Up: At [facility], you and your health needs are our priority.  As part of our continuing mission to provide you with exceptional  heart care, we have created designated Provider Care Teams.  These Care Teams include your primary Cardiologist (physician) and Advanced Practice Providers (APPs -  Physician Assistants and Nurse Practitioners) who all work together to provide you with the care you need, when you need it.  We recommend signing up for the patient portal called "MyChart".  Sign up information is provided on this After Visit Summary.  MyChart is used to connect with patients for Virtual Visits (Telemedicine).  Patients are able to view lab/test results, encounter notes, upcoming appointments, etc.  Non-urgent messages can be sent to your provider as well.   To learn more about what you can do with MyChart, go to [email or web address].    Your next appointment:   12 month(s)  Provider:   You may see Debbe Odea, MD or one of the following Advanced Practice Providers on your designated Care Team:   Nicolasa Ducking, NP Eula Listen, PA-C Cadence Fransico Michael, PA-C Charlsie Quest, NP Carlos Levering, NP]    Signed, Debbe Odea, MD  12/23/2023 3:45 PM    [facility]

## 2023-12-23 NOTE — Patient Instructions (Signed)

## 2023-12-28 DIAGNOSIS — I10 Essential (primary) hypertension: Secondary | ICD-10-CM | POA: Diagnosis not present

## 2023-12-28 DIAGNOSIS — G4733 Obstructive sleep apnea (adult) (pediatric): Secondary | ICD-10-CM | POA: Diagnosis not present

## 2024-01-03 ENCOUNTER — Ambulatory Visit (INDEPENDENT_AMBULATORY_CARE_PROVIDER_SITE_OTHER): Payer: Medicaid Other | Admitting: Dermatology

## 2024-01-03 ENCOUNTER — Encounter: Payer: Self-pay | Admitting: Dermatology

## 2024-01-03 DIAGNOSIS — Z85828 Personal history of other malignant neoplasm of skin: Secondary | ICD-10-CM

## 2024-01-03 DIAGNOSIS — L905 Scar conditions and fibrosis of skin: Secondary | ICD-10-CM

## 2024-01-03 NOTE — Patient Instructions (Signed)

## 2024-01-03 NOTE — Progress Notes (Signed)
   Follow Up Visit   Subjective  Dean Anderson is a 58 y.o. 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 The patient has been bandaging the wound as directed. The endorse the following concerns: no questions or concerns at this time.   The following portions of the chart were reviewed this encounter and updated as appropriate: medications, allergies, medical history  Review of Systems:  No other skin or systemic complaints except as noted in HPI or Assessment and Plan.  Objective  Well appearing patient in no apparent distress; mood and affect are within normal limits.  A full examination was performed including scalp, head, and face. All findings within normal limits unless otherwise noted below.  Healing wound with mild erythema  Relevant physical exam findings are noted in the Assessment and Plan.    Assessment & Plan   Healing s/p Mohs for [facility], treated on 12/01/23, repaired with linear closure - Reassured that wound is healing well - No evidence of infection - No swelling, induration, purulence, dehiscence, or tenderness out of proportion to the clinical exam, see photo above - Discussed that scars take up to 12 months to mature from the date of surgery - Recommend SPF 30+ to scar daily to prevent purple color from UV exposure during scar maturation process - Discussed that erythema and raised appearance of scar will fade over the next 4-6 months - OK to start scar massage at 4-6 weeks post-op - Can consider silicone based products for scar healing starting at 6 weeks post-op - Ok to discontinue ointment daily to wound.  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  Dominga Ferry, Surg Tech III, am acting as scribe for Gwenith Daily, MD.   Documentation: I have reviewed the above documentation for accuracy and completeness, and I agree with the above.  Gwenith Daily, MD

## 2024-01-16 ENCOUNTER — Other Ambulatory Visit: Payer: Self-pay | Admitting: Family Medicine

## 2024-01-16 DIAGNOSIS — E039 Hypothyroidism, unspecified: Secondary | ICD-10-CM

## 2024-01-17 NOTE — Telephone Encounter (Signed)
Requested Prescriptions  Pending Prescriptions Disp Refills   levothyroxine (SYNTHROID) 112 MCG tablet [Pharmacy Med Name: Levothyroxine Sodium 112 MCG Oral Tablet] 90 tablet 1    Sig: TAKE 1 TABLET BY MOUTH ONCE DAILY BEFORE BREAKFAST     Endocrinology:  Hypothyroid Agents Passed - 01/17/2024  2:20 PM      Passed - TSH in normal range and within 360 days    TSH  Date Value Ref Range Status  10/18/2023 4.11 0.40 - 4.50 mIU/L Final         Passed - Valid encounter within last 12 months    Recent Outpatient Visits           3 months ago Annual physical exam   [facility] Mecum, Oswaldo Conroy, PA-C   4 months ago Hypothyroidism, unspecified type   [facility] Danelle Berry, PA-C   9 months ago Essential hypertension   [facility] Danelle Berry, PA-C   1 year ago Essential hypertension   [facility] Danelle Berry, PA-C   1 year ago Essential hypertension   [facility] Caro Laroche, DO       Future Appointments             In 6 days Danelle Berry, PA-C [facility]

## 2024-01-19 ENCOUNTER — Ambulatory Visit: Payer: Medicaid Other | Admitting: Physician Assistant

## 2024-01-20 ENCOUNTER — Ambulatory Visit: Payer: Self-pay | Admitting: Family Medicine

## 2024-01-23 ENCOUNTER — Ambulatory Visit: Payer: Medicaid Other | Admitting: Family Medicine

## 2024-01-23 ENCOUNTER — Encounter: Payer: Self-pay | Admitting: Family Medicine

## 2024-01-23 VITALS — BP 128/72 | HR 81 | Resp 16 | Ht 68.0 in | Wt 238.0 lb

## 2024-01-23 DIAGNOSIS — E039 Hypothyroidism, unspecified: Secondary | ICD-10-CM | POA: Diagnosis not present

## 2024-01-23 DIAGNOSIS — E785 Hyperlipidemia, unspecified: Secondary | ICD-10-CM | POA: Diagnosis not present

## 2024-01-23 DIAGNOSIS — K2270 Barrett's esophagus without dysplasia: Secondary | ICD-10-CM | POA: Diagnosis not present

## 2024-01-23 DIAGNOSIS — G4733 Obstructive sleep apnea (adult) (pediatric): Secondary | ICD-10-CM

## 2024-01-23 DIAGNOSIS — Z6836 Body mass index (BMI) 36.0-36.9, adult: Secondary | ICD-10-CM | POA: Diagnosis not present

## 2024-01-23 DIAGNOSIS — I4819 Other persistent atrial fibrillation: Secondary | ICD-10-CM | POA: Diagnosis not present

## 2024-01-23 DIAGNOSIS — I10 Essential (primary) hypertension: Secondary | ICD-10-CM

## 2024-01-23 MED ORDER — OMEPRAZOLE 20 MG PO CPDR: 20.0000 mg | DELAYED_RELEASE_CAPSULE | Freq: Every day | ORAL | 3 refills | Status: DC

## 2024-01-23 MED ORDER — OLMESARTAN MEDOXOMIL-HCTZ 20-12.5 MG PO TABS: 1.0000 | ORAL_TABLET | Freq: Every day | ORAL | 1 refills | Status: DC

## 2024-01-23 NOTE — Assessment & Plan Note (Signed)
 Weight down a few lbs BMI 36 with multiple comorbidities - HTN, afib, HLD, OSA

## 2024-01-23 NOTE — Assessment & Plan Note (Signed)
 Last last in Sept and Nov were in normal range with improved med compliance Pt euthyroid, no concerning sx Will keep dose the same and recheck in 6 months and next routine f/up Lab Results  Component Value Date   TSH 4.11 10/18/2023   TSH 2.53 08/23/2023   TSH 7.85 (H) 04/12/2023   TSH 3.70 11/30/2022   TSH 7.13 (H) 09/21/2022  Continue 112 mcg levothyroxine daily

## 2024-01-23 NOTE — Assessment & Plan Note (Signed)
 Currently rate controlled with metoprolol 100 mg BID Per cardiology He is being referred to EP Unable to anticoagulate due to past GI bleed Pulse Readings from Last 3 Encounters:  01/23/24 81  12/23/23 100  12/01/23 82

## 2024-01-23 NOTE — Progress Notes (Signed)
 Name: Dean Anderson   MRN: [id number]    DOB: 1966/08/19   Date:01/23/2024       Progress Note  Chief Complaint  Patient presents with   Medical Management of Chronic Issues    3 months   Hypertension     Subjective:   Dean Anderson is a 58 y.o. male, presents to clinic for routine follow up on chronic conditions    Hypothyroid -difficulty with mec compliance/f/up and keeping euvolemic Lab Results  Component Value Date   TSH 4.11 10/18/2023  On levothyroxine 112 mcg dose daily, good compliance Current Symptoms: denies fatigue, weight changes, heat/cold intolerance, bowel/skin changes or CVS symptoms     GERD/barretts - still taking long term PPI, GI sx well controlled- no sx no concerns   Afib/HTN on metoprolol and benicar No palpitations, CP, SOB BP Readings from Last 3 Encounters:  01/23/24 128/72  12/23/23 (!) 122/90  12/01/23 109/70   OSA - CPAP compliant no concerns  Hyperlipidemia:  not on statin per cardiology Last Lipids: Lab Results  Component Value Date   CHOL 184 10/18/2023   HDL 56 10/18/2023   LDLCALC 109 (H) 10/18/2023   TRIG 96 10/18/2023   CHOLHDL 3.3 10/18/2023   - Denies: Chest pain, shortness of breath, myalgias, claudication  Obesity: Wt Readings from Last 5 Encounters:  01/23/24 238 lb (108 kg)  12/23/23 240 lb (108.9 kg)  10/18/23 236 lb 11.2 oz (107.4 kg)  08/23/23 235 lb 12.8 oz (107 kg)  04/12/23 240 lb 4.8 oz (109 kg)   BMI Readings from Last 5 Encounters:  01/23/24 36.19 kg/m  12/23/23 36.49 kg/m  10/18/23 34.95 kg/m  08/23/23 34.82 kg/m  04/12/23 35.49 kg/m       Current Outpatient Medications:    levothyroxine (SYNTHROID) 112 MCG tablet, TAKE 1 TABLET BY MOUTH ONCE DAILY BEFORE BREAKFAST, Disp: 90 tablet, Rfl: 1   metoprolol tartrate (LOPRESSOR) 100 MG tablet, Take 1 tablet (100 mg total) by mouth 2 (two) times daily., Disp: 180 tablet, Rfl: 1   olmesartan-hydrochlorothiazide (BENICAR HCT) 20-12.5 MG tablet,  Take 1 tablet by mouth daily., Disp: 90 tablet, Rfl: 1   omeprazole (PRILOSEC) 20 MG capsule, Take 1 capsule (20 mg total) by mouth at bedtime., Disp: 90 capsule, Rfl: 3  Patient Active Problem List   Diagnosis Date Noted   Advanced care planning/counseling discussion 10/18/2023   Class 2 severe obesity with serious comorbidity and body mass index (BMI) of 35.0 to 35.9 in adult [facility]) 09/21/2022   OSA (obstructive sleep apnea) 10/28/2020   Hypothyroidism 06/26/2020   Hepatic steatosis 06/26/2020   Hyperlipidemia 06/26/2020   History of repair of hiatal hernia 03/12/2020   Hyponatremia 02/20/2020   Barrett's esophagus without dysplasia    Essential hypertension 10/02/2018   Persistent atrial fibrillation (HCC) 09/21/2018    Past Surgical History:  Procedure Laterality Date   COLONOSCOPY WITH PROPOFOL N/A 04/17/2019   Procedure: COLONOSCOPY WITH PROPOFOL;  Surgeon: Midge Minium, MD;  Location[facility] ENDOSCOPY;  Service: Endoscopy;  Laterality: N/A;   ESOPHAGOGASTRODUODENOSCOPY N/A 03/12/2020   Procedure: ESOPHAGOGASTRODUODENOSCOPY (EGD);  Surgeon: Corliss Skains, MD;  Location: [facility] OR;  Service: Thoracic;  Laterality: N/A;   ESOPHAGOGASTRODUODENOSCOPY (EGD) WITH PROPOFOL N/A 04/17/2019   Procedure: ESOPHAGOGASTRODUODENOSCOPY (EGD) WITH PROPOFOL;  Surgeon: Midge Minium, MD;  Location: [facility] ENDOSCOPY;  Service: Endoscopy;  Laterality: N/A;   GIVENS CAPSULE STUDY  05/2019   negative   VIDEO ASSISTED THORACOSCOPY (VATS)/DECORTICATION Right 03/12/2020   Procedure: VIDEO ASSISTED THORACOSCOPY (  VATS)/DRAINING OF AN EMPYEMA;  Surgeon: Corliss Skains, MD;  Location: [facility] OR;  Service: Thoracic;  Laterality: Right;   XI ROBOTIC ASSISTED HIATAL HERNIA REPAIR N/A 03/12/2020   Procedure: XI ROBOTIC ASSISTED HIATAL HERNIA REPAIR USING ACELL MATRIX HIATAL;  Surgeon: Corliss Skains, MD;  Location: [facility] OR;  Service: Thoracic;  Laterality: N/A;    Family History  Problem Relation Age of Onset    Hypertension Mother    Thyroid disease Mother    Stroke Father 47   Liver cancer Maternal Uncle    Heart disease Brother    Hypertension Brother     Social History   Tobacco Use   Smoking status: Never   Smokeless tobacco: Never  Vaping Use   Vaping status: Never Used  Substance Use Topics   Alcohol use: Yes    Alcohol/week: 15.0 standard drinks of alcohol    Types: 15 Standard drinks or equivalent per week    Comment: quit ETOH 03/12/2020   Drug use: Not Currently    Types: Marijuana    Comment: gummy, last week     No Known Allergies  Health Maintenance  Topic Date Due   INFLUENZA VACCINE  03/05/2024 (Originally 07/07/2023)   Colonoscopy  04/16/2029   Hepatitis C Screening  Completed   HIV Screening  Completed   Pneumococcal Vaccine 59-55 Years old  Aged Out   HPV VACCINES  Aged Out   DTaP/Tdap/Td  Discontinued   COVID-19 Vaccine  Discontinued   Zoster Vaccines- Shingrix  Discontinued    Chart Review Today: I personally reviewed active problem list, medication list, allergies, family history, social history, health maintenance, notes from last encounter, lab results, imaging with the patient/caregiver today.   Review of Systems  Constitutional: Negative.   HENT: Negative.    Eyes: Negative.   Respiratory: Negative.    Cardiovascular: Negative.   Gastrointestinal: Negative.   Endocrine: Negative.   Genitourinary: Negative.   Musculoskeletal: Negative.   Skin: Negative.   Allergic/Immunologic: Negative.   Neurological: Negative.   Hematological: Negative.   Psychiatric/Behavioral: Negative.    All other systems reviewed and are negative.    Objective:   Vitals:   01/23/24 1519  BP: 128/72  Pulse: 81  Resp: 16  SpO2: 96%  Weight: 238 lb (108 kg)  Height: 5\' 8"  (1.727 m)    Body mass index is 36.19 kg/m.  Physical Exam Vitals and nursing note reviewed.  Constitutional:      General: He is not in acute distress.    Appearance: Normal  appearance. He is well-developed. He is obese. He is not ill-appearing, toxic-appearing or diaphoretic.  HENT:     Head: Normocephalic and atraumatic.     Nose: Nose normal.  Eyes:     General:        Right eye: No discharge.        Left eye: No discharge.     Conjunctiva/sclera: Conjunctivae normal.  Neck:     Trachea: No tracheal deviation.  Cardiovascular:     Rate and Rhythm: Normal rate. Rhythm irregular.     Pulses: Normal pulses.     Heart sounds: Normal heart sounds. No murmur heard.    No friction rub. No gallop.  Pulmonary:     Effort: Pulmonary effort is normal. No respiratory distress.     Breath sounds: Normal breath sounds. No stridor. No wheezing or rhonchi.  Abdominal:     Comments: Obese, protuberant, NBSx4  Skin:    General:  Skin is warm and dry.     Findings: No rash.  Neurological:     Mental Status: He is alert.     Motor: No abnormal muscle tone.     Coordination: Coordination normal.  Psychiatric:        Mood and Affect: Mood normal.        Behavior: Behavior normal.      Functional Status Survey:   Results for orders placed or performed in visit on 11/23/23  Surgical pathology   Collection Time: 11/23/23 12:00 AM  Result Value Ref Range   SURGICAL PATHOLOGY      SURGICAL PATHOLOGY [facility] [address], [address] [place], [place] [address] Telephone [phone] or [phone] Fax [phone]  REPORT OF DERMATOPATHOLOGY   Accession #: [id number] Patient Name: JAVAUGHN, OPDAHL Visit # : [id number]  MRN: [id number] Cytotechnologist: Zada Girt., Dermatopathologist, Electronic Signature DOB/Age [birth year] (Age: 33) Gender: M Collected Date: 11/23/2023 Received Date: 11/23/2023  FINAL DIAGNOSIS       1. Skin (M), right forehead :       EPIDERMOID CYST       DATE SIGNED OUT: 11/24/2023 ELECTRONIC SIGNATURE : Geryl Councilman, Janie Morning., Dermatopathologist, Electronic Signature  MICROSCOPIC  DESCRIPTION 1. There is a cyst lined by squamous epithelium with granular layer, containing loose lamellar keratin.  CASE COMMENTS STAINS USED IN DIAGNOSIS: H&E H&E H&E H&E H&E    CLINICAL HISTORY  SPECIMEN(S) OBTAINED 1. Skin (M), Right Forehead  SPECIMEN COMMENTS: SPECIMEN CLINICAL  INFORMATION: 1. Neoplasm of uncertain behavior of skin, R/O cyst vs lipoma vs other    Gross Description 1. Shape:  Includes limited  fat, elliptical      Size:  36 x 9 x 14 mm      Lesion:  Clinically mentioned cyst observed      Orientation:  None; all margins inked      Block Summary: Specimen has been submitted in representative sections with 1      cross sections submitted in 5 blocks labeled 1A - 1E   Block(s) submitted. (ew)        Report signed out from the following location(s) [facility] [address], [place] [address] CLIA #: [id number]  [facility] [address] [place], [place] [address] CLIA #: [id number]       Assessment & Plan:   Primary hypertension Assessment & Plan: Bp has been stable and well controlled with benicar BP Readings from Last 3 Encounters:  01/23/24 128/72  12/23/23 (!) 122/90  12/01/23 109/70  Good compliance, no SE or concerns Labs done only 3 months ago and reviewed No changes to management   Orders: -     Olmesartan Medoxomil-HCTZ; Take 1 tablet by mouth daily.  Dispense: 90 tablet; Refill: 1  Hyperlipidemia, unspecified hyperlipidemia type Assessment & Plan: History of mildly elevated cholesterol in the past without requiring statins He is also consulted with cardiology who did not recommend statin medications Will recheck his lipids and recalculate ASCVD risk Lab Results  Component Value Date   CHOL 184 10/18/2023   HDL 56 10/18/2023   LDLCALC 109 (H) 10/18/2023   TRIG 96 10/18/2023   CHOLHDL 3.3 10/18/2023  Labs recently done and reviewed again today with pt Diet/lifestyle  efforts encouraged The 10-year ASCVD risk score (Arnett DK, et al., 2019) is: 5.4%   Values used to calculate the score:     Age: 61 years  Sex: Male     Is Non-Hispanic African American: No     Diabetic: No     Tobacco smoker: No     Systolic Blood Pressure: 128 mmHg     Is BP treated: Yes     HDL Cholesterol: 56 mg/dL     Total Cholesterol: 184 mg/dL     Hypothyroidism, unspecified type Assessment & Plan: Last last in Sept and Nov were in normal range with improved med compliance Pt euthyroid, no concerning sx Will keep dose the same and recheck in 6 months and next routine f/up Lab Results  Component Value Date   TSH 4.11 10/18/2023   TSH 2.53 08/23/2023   TSH 7.85 (H) 04/12/2023   TSH 3.70 11/30/2022   TSH 7.13 (H) 09/21/2022  Continue 112 mcg levothyroxine daily    Persistent atrial fibrillation (HCC) Assessment & Plan: Currently rate controlled with metoprolol 100 mg BID Per cardiology He is being referred to EP Unable to anticoagulate due to past GI bleed Pulse Readings from Last 3 Encounters:  01/23/24 81  12/23/23 100  12/01/23 82      Barrett's esophagus without dysplasia Assessment & Plan: Long term PPI, no sx or concerns, GI following  Orders: -     Omeprazole; Take 1 capsule (20 mg total) by mouth at bedtime.  Dispense: 90 capsule; Refill: 3  OSA (obstructive sleep apnea) Assessment & Plan: Pt endorses good CPAP complianct   Class 2 severe obesity with serious comorbidity and body mass index (BMI) of 36.0 to 36.9 in adult, unspecified obesity type [facility]) Assessment & Plan: Weight down a few lbs BMI 36 with multiple comorbidities - HTN, afib, HLD, OSA      Return in about 6 months (around 07/22/2024) for HTN, HLD, thyroid with labs in office f/up.   Danelle Berry, PA-C 01/23/24 3:29 PM

## 2024-01-23 NOTE — Assessment & Plan Note (Signed)
 Long term PPI, no sx or concerns, GI following

## 2024-01-23 NOTE — Assessment & Plan Note (Signed)
 History of mildly elevated cholesterol in the past without requiring statins He is also consulted with cardiology who did not recommend statin medications Will recheck his lipids and recalculate ASCVD risk Lab Results  Component Value Date   CHOL 184 10/18/2023   HDL 56 10/18/2023   LDLCALC 109 (H) 10/18/2023   TRIG 96 10/18/2023   CHOLHDL 3.3 10/18/2023  Labs recently done and reviewed again today with pt Diet/lifestyle efforts encouraged The 10-year ASCVD risk score (Arnett DK, et al., 2019) is: 5.4%   Values used to calculate the score:     Age: 58 years     Sex: Male     Is Non-Hispanic African American: No     Diabetic: No     Tobacco smoker: No     Systolic Blood Pressure: 128 mmHg     Is BP treated: Yes     HDL Cholesterol: 56 mg/dL     Total Cholesterol: 184 mg/dL

## 2024-01-23 NOTE — Assessment & Plan Note (Signed)
 Pt endorses good CPAP complianct

## 2024-01-23 NOTE — Assessment & Plan Note (Signed)
 Bp has been stable and well controlled with benicar BP Readings from Last 3 Encounters:  01/23/24 128/72  12/23/23 (!) 122/90  12/01/23 109/70  Good compliance, no SE or concerns Labs done only 3 months ago and reviewed No changes to management

## 2024-02-14 ENCOUNTER — Encounter: Payer: Self-pay | Admitting: Dermatology

## 2024-02-14 ENCOUNTER — Ambulatory Visit (INDEPENDENT_AMBULATORY_CARE_PROVIDER_SITE_OTHER): Payer: Medicaid Other | Admitting: Dermatology

## 2024-02-14 VITALS — BP 96/64 | HR 87

## 2024-02-14 DIAGNOSIS — Z85828 Personal history of other malignant neoplasm of skin: Secondary | ICD-10-CM | POA: Diagnosis not present

## 2024-02-14 DIAGNOSIS — L905 Scar conditions and fibrosis of skin: Secondary | ICD-10-CM

## 2024-02-14 NOTE — Progress Notes (Signed)
   Follow Up Visit   Subjective  Dean Anderson is a 58 y.o. male who presents for the following: follow up from Mohs surgery   The patient presents for follow up from Mohs surgery for a BCC on the left temple, treated on 12/01/23, repaired with linear closure. The patient has been bandaging the wound as directed. The endorse the following concerns: none  He also is s/p WLE for cyst on right scalp.   The following portions of the chart were reviewed this encounter and updated as appropriate: medications, allergies, medical history  Review of Systems:  No other skin or systemic complaints except as noted in HPI or Assessment and Plan.  Objective  Well appearing patient in no apparent distress; mood and affect are within normal limits.  A full examination was performed including scalp, head, face. All findings within normal limits unless otherwise noted below.  Healing wound with mild erythema  Relevant physical exam findings are noted in the Assessment and Plan.     Assessment & Plan   Healing s/p Mohs for [facility] on left forehead , treated on 12/01/23, repaired with linear closure - Reassured that wound is healing well - No evidence of infection - No swelling, induration, purulence, dehiscence, or tenderness out of proportion to the clinical exam, see photo above - Discussed that scars take up to 12 months to mature from the date of surgery - Recommend SPF 30+ to scar daily to prevent purple color from UV exposure during scar maturation process - Discussed that erythema and raised appearance of scar will fade over the next 4-6 months - OK to start scar massage at 4-6 weeks post-op - Can consider silicone based products for scar healing starting at 6 weeks post-op  Scar s/p WLE for EIC on right forehead, treated on 11/23/2023, repaired with complex linear closure - Reassured that wound has healed well - Discussed that scars take up to 12 months to mature from the date of surgery -  Recommend SPF 30+ to scar daily to prevent purple color - OK to start scar massage at 4-6 weeks post-op - Can consider silicone based products for scar healing  HISTORY OF BASAL CELL CARCINOMA OF THE SKIN - No evidence of recurrence today - Recommend regular full body skin exams - Recommend daily broad spectrum sunscreen SPF 30+ to sun-exposed areas, reapply every 2 hours as needed.  - Call if any new or changing lesions are noted between office visits  Return if symptoms worsen or fail to improve.  I, Tillie Fantasia, CMA, am acting as scribe for Gwenith Daily, MD.   Documentation: I have reviewed the above documentation for accuracy and completeness, and I agree with the above.  Gwenith Daily, MD

## 2024-02-14 NOTE — Patient Instructions (Addendum)

## 2024-02-29 ENCOUNTER — Ambulatory Visit: Payer: Medicaid Other | Attending: Cardiology | Admitting: Cardiology

## 2024-02-29 ENCOUNTER — Encounter: Payer: Self-pay | Admitting: Cardiology

## 2024-02-29 VITALS — BP 114/74 | HR 92 | Wt 237.8 lb

## 2024-02-29 DIAGNOSIS — I10 Essential (primary) hypertension: Secondary | ICD-10-CM | POA: Diagnosis not present

## 2024-02-29 DIAGNOSIS — I4819 Other persistent atrial fibrillation: Secondary | ICD-10-CM

## 2024-02-29 NOTE — Patient Instructions (Signed)
 Medication Instructions:  Your physician recommends that you continue on your current medications as directed. Please refer to the Current Medication list given to you today.  *If you need a refill on your cardiac medications before your next appointment, please call your pharmacy*  Follow-Up: At [facility], you and your health needs are our priority.  As part of our continuing mission to provide you with exceptional heart care, we have created designated Provider Care Teams.  These Care Teams include your primary Cardiologist (physician) and Advanced Practice Providers (APPs -  Physician Assistants and Nurse Practitioners) who all work together to provide you with the care you need, when you need it.  Your next appointment:   As needed with Dr. Lalla Brothers

## 2024-02-29 NOTE — Progress Notes (Signed)
 Electrophysiology Office Note:    Date:  02/29/2024   ID:  Dean Anderson, DOB 12-14-1965, MRN [id number]  [facility] Cardiologist:  Debbe Odea, MD  [facility] Electrophysiologist:  Lanier Prude, MD   Referring MD: Debbe Odea, MD   Chief Complaint: Atrial fibrillation  History of Present Illness:    Dean Anderson is a 58 year old man who I am seeing today for an evaluation of atrial fibrillation at the request of Dr. Azucena Cecil.  The patient has a history of persistent atrial fibrillation not on anticoagulation given history of GI bleeding.  He also has a history of hypertension and sleep apnea.  He last saw Dr. Azucena Cecil December 23, 2023.  Is a history of VATS due to empyema.  He believes he has had atrial fibrillation for many years.  He was completely asymptomatic.  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  His father has a history of a stroke.  No atrial fibrillation that he is aware of in his family.    Their past medical, social and family history was reviewed.   ROS:   Please see the history of present illness.    All other systems reviewed and are negative.  EKGs/Labs/Other Studies Reviewed:    The following studies were reviewed today:  Apr 21, 2020 echo EF 60-65 RV normal Moderately dilated left atrium No significant valvular disease  December 23, 2023 EKG shows atrial fibrillation  February 05, 2022 EKG shows atrial fibrillation  July 31, 2021 EKG shows atrial fibrillation  July 29, 2020 EKG shows atrial fibrillation  September 21, 2018 EKG shows atrial fibrillation       EKG Interpretation Date/Time:  Wednesday February 29 2024 15:30:43 EDT Ventricular Rate:  92 PR Interval:    QRS Duration:  80 QT Interval:  356 QTC Calculation: 440 R Axis:   55  Text Interpretation: Atrial fibrillation Low voltage QRS Confirmed by Steffanie Dunn [phone]) on 02/29/2024 3:55:44 PM    Physical Exam:    VS:  BP 114/74   Pulse  92   Wt 237 lb 12.8 oz (107.9 kg)   SpO2 99%   BMI 36.16 kg/m     Wt Readings from Last 3 Encounters:  02/29/24 237 lb 12.8 oz (107.9 kg)  01/23/24 238 lb (108 kg)  12/23/23 240 lb (108.9 kg)     GEN: no distress obese CARD: Irregularly irregular, No MRG RESP: No IWOB. CTAB.        ASSESSMENT AND PLAN:    1. Persistent atrial fibrillation (HCC)   2. Primary hypertension     #Atrial fibrillation #Hx of anemia and GI bleeding Asymptomatic.  His CHA2DS2-VASc is 1 for hypertension.  He should take aspirin 81 mg by mouth once daily if tolerated.  No indication for systemic anticoagulation at this time.  I discussed this in detail with the patient during today's visit.  When he turns 67 he will need to start an anticoagulant or sooner if he develops other risk factors.  I would consider his atrial fibrillation permanent at this time given how slow it conducts, the severity of left atrial dilation in 2021 and given the chronicity of the arrhythmia.    #Hypertension At goal today.  Recommend checking blood pressures 1-2 times per week at home and recording the values.  Recommend bringing these recordings to the primary care physician. Continue Benicar and Lopressor    Signed, Sheria Lang T. Lalla Brothers, MD, [facility], [facility] 02/29/2024 4:05 PM    Electrophysiology  [facility]

## 2024-07-02 DIAGNOSIS — I10 Essential (primary) hypertension: Secondary | ICD-10-CM | POA: Diagnosis not present

## 2024-07-02 DIAGNOSIS — G4733 Obstructive sleep apnea (adult) (pediatric): Secondary | ICD-10-CM | POA: Diagnosis not present

## 2024-07-23 ENCOUNTER — Ambulatory Visit: Payer: Medicaid Other | Admitting: Family Medicine

## 2024-07-23 ENCOUNTER — Encounter: Payer: Self-pay | Admitting: Family Medicine

## 2024-07-23 VITALS — BP 138/86 | HR 84 | Resp 16 | Ht 68.0 in | Wt 237.0 lb

## 2024-07-23 DIAGNOSIS — E785 Hyperlipidemia, unspecified: Secondary | ICD-10-CM

## 2024-07-23 DIAGNOSIS — G4733 Obstructive sleep apnea (adult) (pediatric): Secondary | ICD-10-CM | POA: Diagnosis not present

## 2024-07-23 DIAGNOSIS — Z6836 Body mass index (BMI) 36.0-36.9, adult: Secondary | ICD-10-CM

## 2024-07-23 DIAGNOSIS — I4819 Other persistent atrial fibrillation: Secondary | ICD-10-CM | POA: Diagnosis not present

## 2024-07-23 DIAGNOSIS — E66812 Obesity, class 2: Secondary | ICD-10-CM | POA: Diagnosis not present

## 2024-07-23 DIAGNOSIS — Z5181 Encounter for therapeutic drug level monitoring: Secondary | ICD-10-CM | POA: Diagnosis not present

## 2024-07-23 DIAGNOSIS — Z9889 Other specified postprocedural states: Secondary | ICD-10-CM | POA: Diagnosis not present

## 2024-07-23 DIAGNOSIS — Z8719 Personal history of other diseases of the digestive system: Secondary | ICD-10-CM | POA: Diagnosis not present

## 2024-07-23 DIAGNOSIS — K2270 Barrett's esophagus without dysplasia: Secondary | ICD-10-CM

## 2024-07-23 DIAGNOSIS — E039 Hypothyroidism, unspecified: Secondary | ICD-10-CM

## 2024-07-23 DIAGNOSIS — I10 Essential (primary) hypertension: Secondary | ICD-10-CM | POA: Diagnosis not present

## 2024-07-23 DIAGNOSIS — E871 Hypo-osmolality and hyponatremia: Secondary | ICD-10-CM | POA: Diagnosis not present

## 2024-07-23 NOTE — Progress Notes (Signed)
 Name: Dean Anderson   MRN: [id number]    DOB: 09-24-1966   Date:07/23/2024       Progress Note  Chief Complaint  Patient presents with   Medical Management of Chronic Issues    6 month follow-up   Hypertension   Hyperlipidemia   Hypothyroidism     Subjective:   Dean Anderson is a 58 y.o. male, presents to clinic for routine follow up on chronic conditions  Routine f/up  GERD/barretts esophagus  HLD Afib rate controlled unable to do anticoagulation due to past GI bleed Hypothyroid on levothyroxine   Lab Results  Component Value Date   TSH 4.11 10/18/2023    Discussed the use of AI scribe software for clinical note transcription with the patient, who gave verbal consent to proceed.  History of Present Illness Dean Anderson is a 58 year old male who presents for routine follow-up and lab work.  Thyroid  function monitoring - Due for thyroid  function laboratory evaluation, with last labs performed nearly one year ago - No current concerns with thyroid  medication - Recently refilled thyroid  medication with a 90-day supply  Cardiac arrhythmia and medication management - Atrial fibrillation managed with metoprolol  - Stable on current metoprolol  regimen; medication supply is approximately half full - Aspirin  81 mg daily for stroke prevention - Cardiologist previously discussed initiation of anticoagulation at age 50 - Interest in reducing overall medication burden - No chest pain, shortness of breath, or palpitations  Dyslipidemia monitoring - Due for cholesterol laboratory evaluation, with last labs performed nearly one year ago  Gastrointestinal symptoms and history - History of esophageal issues, including surgically repaired hiatal hernia - No current symptoms of acid reflux - Inconsistent use of omeprazole  - History of 'bare esophagus'  Gastrointestinal bleeding and anticoagulation concerns - History of significant blood loss with no source identified after  gastrointestinal procedures, including capsule endoscopy - Concern regarding the impact of anticoagulation on hemorrhoidal bleeding     Current Outpatient Medications:    levothyroxine  (SYNTHROID ) 112 MCG tablet, TAKE 1 TABLET BY MOUTH ONCE DAILY BEFORE BREAKFAST, Disp: 90 tablet, Rfl: 1   metoprolol  tartrate (LOPRESSOR ) 100 MG tablet, Take 1 tablet (100 mg total) by mouth 2 (two) times daily., Disp: 180 tablet, Rfl: 1   olmesartan -hydrochlorothiazide  (BENICAR  HCT) 20-12.5 MG tablet, Take 1 tablet by mouth daily., Disp: 90 tablet, Rfl: 1   omeprazole  (PRILOSEC) 20 MG capsule, Take 1 capsule (20 mg total) by mouth at bedtime., Disp: 90 capsule, Rfl: 3  Patient Active Problem List   Diagnosis Date Noted   Class 2 severe obesity with serious comorbidity and body mass index (BMI) of 36.0 to 36.9 in adult [facility] 09/21/2022   OSA (obstructive sleep apnea) 10/28/2020   Hypothyroidism 06/26/2020   Hepatic steatosis 06/26/2020   Hyperlipidemia 06/26/2020   History of repair of hiatal hernia 03/12/2020   Hyponatremia 02/20/2020   Barrett's esophagus without dysplasia    Primary hypertension 10/02/2018   Persistent atrial fibrillation (HCC) 09/21/2018    Past Surgical History:  Procedure Laterality Date   COLONOSCOPY WITH PROPOFOL  N/A 04/17/2019   Procedure: COLONOSCOPY WITH PROPOFOL ;  Surgeon: Jinny Carmine, MD;  Location: [facility] ENDOSCOPY;  Service: Endoscopy;  Laterality: N/A;   ESOPHAGOGASTRODUODENOSCOPY N/A 03/12/2020   Procedure: ESOPHAGOGASTRODUODENOSCOPY (EGD);  Surgeon: Shyrl Linnie KIDD, MD;  Location: [facility] OR;  Service: Thoracic;  Laterality: N/A;   ESOPHAGOGASTRODUODENOSCOPY (EGD) WITH PROPOFOL  N/A 04/17/2019   Procedure: ESOPHAGOGASTRODUODENOSCOPY (EGD) WITH PROPOFOL ;  Surgeon: Jinny Carmine, MD;  Location: [facility] ENDOSCOPY;  Service:  Endoscopy;  Laterality: N/A;   GIVENS CAPSULE STUDY  05/2019   negative   VIDEO ASSISTED THORACOSCOPY (VATS)/DECORTICATION Right 03/12/2020   Procedure:  VIDEO ASSISTED THORACOSCOPY (VATS)/DRAINING OF AN EMPYEMA;  Surgeon: Shyrl Linnie KIDD, MD;  Location: [facility] OR;  Service: Thoracic;  Laterality: Right;   XI ROBOTIC ASSISTED HIATAL HERNIA REPAIR N/A 03/12/2020   Procedure: XI ROBOTIC ASSISTED HIATAL HERNIA REPAIR USING ACELL MATRIX HIATAL;  Surgeon: Shyrl Linnie KIDD, MD;  Location: [facility] OR;  Service: Thoracic;  Laterality: N/A;    Family History  Problem Relation Age of Onset   Hypertension Mother    Thyroid  disease Mother    Stroke Father 7   Liver cancer Maternal Uncle    Heart disease Brother    Hypertension Brother     Social History   Tobacco Use   Smoking status: Never   Smokeless tobacco: Never  Vaping Use   Vaping status: Never Used  Substance Use Topics   Alcohol use: Yes    Alcohol/week: 15.0 standard drinks of alcohol    Types: 15 Standard drinks or equivalent per week    Comment: quit ETOH 03/12/2020   Drug use: Not Currently    Types: Marijuana    Comment: gummy, last week     No Known Allergies  Health Maintenance  Topic Date Due   INFLUENZA VACCINE  03/05/2025 (Originally 07/06/2024)   Pneumococcal Vaccine: 50+ Years (2 of 2 - PCV) 07/23/2025 (Originally 03/16/2021)   Hepatitis B Vaccines 19-59 Average Risk (1 of 3 - 19+ 3-dose series) 07/23/2025 (Originally 06/05/1987)   Colonoscopy  04/16/2029   Hepatitis C Screening  Completed   HIV Screening  Completed   HPV VACCINES  Aged Out   Meningococcal B Vaccine  Aged Out   DTaP/Tdap/Td  Discontinued   Pneumococcal Vaccine  Discontinued   COVID-19 Vaccine  Discontinued   Zoster Vaccines- Shingrix  Discontinued    Chart Review Today: I personally reviewed active problem list, medication list, allergies, family history, social history, health maintenance, notes from last encounter, lab results, imaging with the patient/caregiver today.   Review of Systems  All other systems reviewed and are negative.    Objective:   Vitals:   07/23/24 1516  BP: 138/86   Pulse: 84  Resp: 16  SpO2: 97%  Weight: 237 lb (107.5 kg)  Height: 5' 8 (1.727 m)    Body mass index is 36.04 kg/m.  Physical Exam Vitals and nursing note reviewed.  Constitutional:      General: He is not in acute distress.    Appearance: Normal appearance. He is well-developed. He is obese. He is not ill-appearing, toxic-appearing or diaphoretic.  HENT:     Head: Normocephalic and atraumatic.     Right Ear: External ear normal.     Left Ear: External ear normal.     Nose: Nose normal.  Eyes:     General: No scleral icterus.       Right eye: No discharge.        Left eye: No discharge.     Conjunctiva/sclera: Conjunctivae normal.  Neck:     Trachea: No tracheal deviation.  Cardiovascular:     Rate and Rhythm: Normal rate. Rhythm irregular.     Pulses: Normal pulses.     Heart sounds: Normal heart sounds. No murmur heard.    No friction rub. No gallop.  Pulmonary:     Effort: Pulmonary effort is normal. No respiratory distress.     Breath sounds:  Normal breath sounds. No stridor. No wheezing, rhonchi or rales.  Musculoskeletal:     Right lower leg: No edema.     Left lower leg: No edema.  Skin:    General: Skin is warm and dry.     Findings: No rash.  Neurological:     Mental Status: He is alert.     Motor: No abnormal muscle tone.     Coordination: Coordination normal.     Gait: Gait normal.  Psychiatric:        Mood and Affect: Mood normal.        Behavior: Behavior normal.            Assessment & Plan:    Assessment & Plan  Problem List Items Addressed This Visit       Cardiovascular and Mediastinum   Persistent atrial fibrillation [facility])   Per cardiology, chronic afib unable to anticoagulate, rate controlled  Managed with metoprolol . Cardiologist discussed potential anticoagulation at age 77. No current plans for cardioversion or ablation. GI clearance needed for potential future procedures/anticoagulation - Refer to GI for clearance for  potential future procedures related to atrial fibrillation management.  Pulse Readings from Last 3 Encounters:  07/23/24 84  02/29/24 92  02/14/24 87         Relevant Medications   metoprolol  tartrate (LOPRESSOR ) 100 MG tablet   Primary hypertension   BP at goal - see above      Relevant Medications   metoprolol  tartrate (LOPRESSOR ) 100 MG tablet     Respiratory   OSA (obstructive sleep apnea)   Pt endorses good CPAP complianct        Digestive   Barrett's esophagus without dysplasia   Barrett's esophagus without dysplasia No current symptoms of acid reflux. Omeprazole  is used intermittently. Previous hiatal hernia repair may have alleviated symptoms. - Continue omeprazole  as needed for acid reflux prevention. - Refer to GI for ongoing management and potential endoscopy if symptoms arise.      Relevant Orders   Ambulatory referral to Gastroenterology     Endocrine   Hypothyroidism - Primary   Hypothyroidism is well-managed with levothyroxine , pt feels euthyroid  - Order thyroid  function tests to monitor levels.      Relevant Medications   metoprolol  tartrate (LOPRESSOR ) 100 MG tablet   Other Relevant Orders   CBC with Differential/Platelet (Completed)   TSH (Completed)     Other   Hyponatremia   Onset several years ago - recheck with chemistry      History of repair of hiatal hernia   Relevant Orders   Ambulatory referral to Gastroenterology   Hyperlipidemia   Labs are due for re-evaluation. Not currently on medications      Relevant Medications   metoprolol  tartrate (LOPRESSOR ) 100 MG tablet   Other Relevant Orders   Lipid Profile (Completed)   Class 2 severe obesity with serious comorbidity and body mass index (BMI) of 36.0 to 36.9 in adult [facility])   Weight essentially unchanged compared to OV 6 months ago Wt Readings from Last 5 Encounters:  07/23/24 237 lb (107.5 kg)  02/29/24 237 lb 12.8 oz (107.9 kg)  01/23/24 238 lb (108 kg)  12/23/23 240 lb  (108.9 kg)  10/18/23 236 lb 11.2 oz (107.4 kg)   BMI Readings from Last 5 Encounters:  07/23/24 36.04 kg/m  02/29/24 36.16 kg/m  01/23/24 36.19 kg/m  12/23/23 36.49 kg/m  10/18/23 34.95 kg/m    BMI 36 with multiple comorbidities - HTN,  afib, HLD, OSA      Relevant Orders   Comprehensive Metabolic Panel (CMET) (Completed)   Lipid Profile (Completed)   Other Visit Diagnoses       History of GI bleed       Relevant Orders   CBC with Differential/Platelet (Completed)   Ambulatory referral to Gastroenterology       Recording duration: 22 minutes       Return in about 6 months (around 01/23/2025) for Routine follow-up.   Michelene Cower, PA-C 07/23/24 3:35 PM

## 2024-07-24 ENCOUNTER — Ambulatory Visit: Payer: Self-pay | Admitting: Family Medicine

## 2024-07-24 DIAGNOSIS — K2270 Barrett's esophagus without dysplasia: Secondary | ICD-10-CM

## 2024-07-24 DIAGNOSIS — I10 Essential (primary) hypertension: Secondary | ICD-10-CM

## 2024-07-24 DIAGNOSIS — E039 Hypothyroidism, unspecified: Secondary | ICD-10-CM

## 2024-07-24 LAB — COMPREHENSIVE METABOLIC PANEL WITH GFR
AG Ratio: 1.5 (calc) (ref 1.0–2.5)
ALT: 20 U/L (ref 9–46)
AST: 16 U/L (ref 10–35)
Albumin: 4.3 g/dL (ref 3.6–5.1)
Alkaline phosphatase (APISO): 46 U/L (ref 35–144)
BUN: 14 mg/dL (ref 7–25)
CO2: 28 mmol/L (ref 20–32)
Calcium: 9.5 mg/dL (ref 8.6–10.3)
Chloride: 93 mmol/L — ABNORMAL LOW (ref 98–110)
Creat: 0.97 mg/dL (ref 0.70–1.30)
Globulin: 2.8 g/dL (ref 1.9–3.7)
Glucose, Bld: 100 mg/dL — ABNORMAL HIGH (ref 65–99)
Potassium: 4.7 mmol/L (ref 3.5–5.3)
Sodium: 132 mmol/L — ABNORMAL LOW (ref 135–146)
Total Bilirubin: 1.9 mg/dL — ABNORMAL HIGH (ref 0.2–1.2)
Total Protein: 7.1 g/dL (ref 6.1–8.1)
eGFR: 92 mL/min/1.73m2 (ref >=60)

## 2024-07-24 LAB — CBC WITH DIFFERENTIAL/PLATELET
Absolute Lymphocytes: 657 {cells}/uL — ABNORMAL LOW (ref 850–3900)
Absolute Monocytes: 956 {cells}/uL — ABNORMAL HIGH (ref 200–950)
Basophils Absolute: 37 {cells}/uL (ref 0–200)
Basophils Relative: 0.5 %
Eosinophils Absolute: 219 {cells}/uL (ref 15–500)
Eosinophils Relative: 3 %
HCT: 46.6 % (ref 38.5–50.0)
Hemoglobin: 15.7 g/dL (ref 13.2–17.1)
MCH: 31 pg (ref 27.0–33.0)
MCHC: 33.7 g/dL (ref 32.0–36.0)
MCV: 91.9 fL (ref 80.0–100.0)
MPV: 9.7 fL (ref 7.5–12.5)
Monocytes Relative: 13.1 %
Neutro Abs: 5431 {cells}/uL (ref 1500–7800)
Neutrophils Relative %: 74.4 %
Platelets: 200 Thousand/uL (ref 140–400)
RBC: 5.07 Million/uL (ref 4.20–5.80)
RDW: 13.6 % (ref 11.0–15.0)
Total Lymphocyte: 9 %
WBC: 7.3 Thousand/uL (ref 3.8–10.8)

## 2024-07-24 LAB — LIPID PANEL
Cholesterol: 183 mg/dL (ref <200)
HDL: 69 mg/dL (ref >=40)
LDL Cholesterol (Calc): 97 mg/dL
Non-HDL Cholesterol (Calc): 114 mg/dL (ref <130)
Total CHOL/HDL Ratio: 2.7 (calc) (ref <5.0)
Triglycerides: 78 mg/dL (ref <150)

## 2024-07-24 LAB — TSH: TSH: 3.47 m[IU]/L (ref 0.40–4.50)

## 2024-07-24 MED ORDER — OLMESARTAN MEDOXOMIL 40 MG PO TABS: 40.0000 mg | ORAL_TABLET | Freq: Every day | ORAL | 1 refills | Status: DC

## 2024-07-24 MED ORDER — LEVOTHYROXINE SODIUM 112 MCG PO TABS: 112.0000 ug | ORAL_TABLET | Freq: Every day | ORAL | 1 refills | Status: AC

## 2024-07-24 MED ORDER — OMEPRAZOLE 20 MG PO CPDR: 20.0000 mg | DELAYED_RELEASE_CAPSULE | Freq: Every day | ORAL | 3 refills | Status: AC

## 2024-08-02 MED ORDER — METOPROLOL TARTRATE 100 MG PO TABS: 100.0000 mg | ORAL_TABLET | Freq: Two times a day (BID) | ORAL | 1 refills | Status: AC

## 2024-08-02 NOTE — Assessment & Plan Note (Signed)
 Hypothyroidism is well-managed with levothyroxine , pt feels euthyroid  - Order thyroid  function tests to monitor levels.

## 2024-08-02 NOTE — Assessment & Plan Note (Signed)
 Labs are due for re-evaluation. Not currently on medications

## 2024-08-02 NOTE — Assessment & Plan Note (Addendum)
 Per cardiology, chronic afib unable to anticoagulate, rate controlled  Managed with metoprolol . Cardiologist discussed potential anticoagulation at age 58. No current plans for cardioversion or ablation. GI clearance needed for potential future procedures/anticoagulation - Refer to GI for clearance for potential future procedures related to atrial fibrillation management.  Pulse Readings from Last 3 Encounters:  07/23/24 84  02/29/24 92  02/14/24 87

## 2024-08-02 NOTE — Assessment & Plan Note (Signed)
 BP at goal - see above

## 2024-08-02 NOTE — Assessment & Plan Note (Signed)
 Barrett's esophagus without dysplasia No current symptoms of acid reflux. Omeprazole  is used intermittently. Previous hiatal hernia repair may have alleviated symptoms. - Continue omeprazole  as needed for acid reflux prevention. - Refer to GI for ongoing management and potential endoscopy if symptoms arise.

## 2024-08-02 NOTE — Assessment & Plan Note (Signed)
 Onset several years ago - recheck with chemistry

## 2024-08-02 NOTE — Assessment & Plan Note (Signed)
 Pt endorses good CPAP complianct

## 2024-08-02 NOTE — Assessment & Plan Note (Signed)
 Weight essentially unchanged compared to OV 6 months ago Wt Readings from Last 5 Encounters:  07/23/24 237 lb (107.5 kg)  02/29/24 237 lb 12.8 oz (107.9 kg)  01/23/24 238 lb (108 kg)  12/23/23 240 lb (108.9 kg)  10/18/23 236 lb 11.2 oz (107.4 kg)   BMI Readings from Last 5 Encounters:  07/23/24 36.04 kg/m  02/29/24 36.16 kg/m  01/23/24 36.19 kg/m  12/23/23 36.49 kg/m  10/18/23 34.95 kg/m    BMI 36 with multiple comorbidities - HTN, afib, HLD, OSA

## 2024-08-30 ENCOUNTER — Other Ambulatory Visit: Payer: Self-pay | Admitting: Family Medicine

## 2024-08-30 DIAGNOSIS — I10 Essential (primary) hypertension: Secondary | ICD-10-CM

## 2024-08-31 NOTE — Telephone Encounter (Signed)
 Requested Prescriptions  Refused Prescriptions Disp Refills   olmesartan -hydrochlorothiazide  (BENICAR  HCT) 20-12.5 MG tablet [Pharmacy Med Name: Olmesartan  Medoxomil-HCTZ 20-12.5 MG Oral Tablet] 90 tablet 0    Sig: Take 1 tablet by mouth once daily     Cardiovascular: ARB + Diuretic Combos Failed - 08/31/2024  3:25 PM      Failed - Na in normal range and within 180 days    Sodium  Date Value Ref Range Status  07/23/2024 132 (L) 135 - 146 mmol/L Final  04/13/2019 138 134 - 144 mmol/L Final         Passed - K in normal range and within 180 days    Potassium  Date Value Ref Range Status  07/23/2024 4.7 3.5 - 5.3 mmol/L Final         Passed - Cr in normal range and within 180 days    Creat  Date Value Ref Range Status  07/23/2024 0.97 0.70 - 1.30 mg/dL Final         Passed - eGFR is 10 or above and within 180 days    GFR, Est African American  Date Value Ref Range Status  01/19/2021 92 > OR = 60 mL/min/1.34m2 Final   GFR, Est Non African American  Date Value Ref Range Status  01/19/2021 79 > OR = 60 mL/min/1.37m2 Final   GFR  Date Value Ref Range Status  02/19/2020 77.66 >60.00 mL/min Final   eGFR  Date Value Ref Range Status  07/23/2024 92 > OR = 60 mL/min/1.50m2 Final         Passed - Patient is not pregnant      Passed - Last BP in normal range    BP Readings from Last 1 Encounters:  07/23/24 138/86         Passed - Valid encounter within last 6 months    Recent Outpatient Visits           1 month ago Hypothyroidism, unspecified type   [facility] Leavy Mole, PA-C   7 months ago Primary hypertension   [facility] Leavy Mole, PA-C

## 2024-09-19 NOTE — Progress Notes (Unsigned)
 09/20/2024 Dean Anderson [id number] 1966-09-12  Gastroenterology Office Note    Referring Provider: Leavy Mole, PA-C Primary Care Physician:  Leavy Mole, PA-C  Primary GI Provider: Jinny Carmine, MD    Chief Complaint   Chief Complaint  Patient presents with   Other    Barretts esophagus-blood in stool- notices a drop in the toilet-normal bowel movements-has hemorrhoids     History of Present Illness   Dean Anderson is a 58 y.o. male with PMHX of A-fib, Barrett's esophagus, H. pylori, hiatal hernia repair (2021) presenting today at the request of Leavy Mole, PA-C due to barretts esophagus.   Patient was seen by PCP on 07/23/2024 and referred for follow-up due to history of Barrett's esophagus.   Patient reports he is not having any GERD or reflux symptoms.  He has omeprazole  but does not take it.   Patient reports he has bowel movement typically every other day.  Occasionally he may see a small amount of bright red blood in the toilet bowl or when he wipes if he has hard stool and has to strain.  He reports history of hemorrhoids, denies itching pain or burning.  Denies melena.   He drinks 2-3 beers daily.  Drinks about 5 cups of water a day  05/17/2019 capsule endoscopy with no abnormaliies.   May 2020 Upper endoscopy , Dr. Jinny 6 cm long segment Barrett's, biopsied every 2 cm Gastric erythema Hiatal hernia Repeat upper endoscopy due in 2023    Colonoscopy, Dr. Jinny, with nonbleeding internal hemorrhoids, otherwise normal   Pathology showed H. 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   Past Medical History:  Diagnosis Date   Acute gastritis without hemorrhage    Advanced care planning/counseling discussion 10/18/2023   Anemia    Atrial fibrillation (HCC)    Dyspnea    Family history of adverse reaction to anesthesia    brother woke up in a rage after geting anesthesia for teeth surgery   GERD (gastroesophageal reflux  disease)    Hematochezia    History of hiatal hernia    Hypertension    Hypothyroidism    Pleural effusion on right 01/11/2020    Onset of symptoms early 2021 assoc with hbp/afib with neg cards w/u 02/14/20  - Thoracentesis 02/20/2020 >>>   2000 cc  Wbc 267  L > polys  Exudative by protein  with glucose wnl  Cytology >  Still large effusion  Present with loculations > refer to T surgery for ? VATS   Thyroid  disease     Past Surgical History:  Procedure Laterality Date   COLONOSCOPY WITH PROPOFOL  N/A 04/17/2019   Procedure: COLONOSCOPY WITH PROPOFOL ;  Surgeon: Jinny Carmine, MD;  Location: [facility]COPY;  Service: Endoscopy;  Laterality: N/A;   ESOPHAGOGASTRODUODENOSCOPY N/A 03/12/2020   Procedure: ESOPHAGOGASTRODUODENOSCOPY (EGD);  Surgeon: Shyrl Linnie KIDD, MD;  Location: [facility] OR;  Service: Thoracic;  Laterality: N/A;   ESOPHAGOGASTRODUODENOSCOPY (EGD) WITH PROPOFOL  N/A 04/17/2019   Procedure: ESOPHAGOGASTRODUODENOSCOPY (EGD) WITH PROPOFOL ;  Surgeon: Jinny Carmine, MD;  Location: [facility] END[facility]COPY;  Service: Endoscopy;  Laterality: N/A;   GIVENS CAPSULE STUDY  05/2019   negative   VIDEO ASSISTED THORACOSCOPY (VATS)/DECORTICATION Right 03/12/2020   Procedure: VIDEO ASSISTED THORACOSCOPY (VATS)/DRAINING OF AN EMPYEMA;  Surgeon: Shyrl Linnie KIDD, MD;  Location: [facility] OR;  Service: Thoracic;  Laterality: Right;   XI ROBOTIC ASSISTED HIATAL HERNIA REPAIR N/A 03/12/2020   Procedure: XI ROBOTIC ASSISTED HIATAL HERNIA REPAIR USING ACELL MATRIX HIATAL;  Surgeon: Shyrl Linnie KIDD, MD;  Location: [facility] OR;  Service: Thoracic;  Laterality: N/A;    Current Outpatient Medications  Medication Sig Dispense Refill   levothyroxine  (SYNTHROID ) 112 MCG tablet Take 1 tablet (112 mcg total) by mouth daily before breakfast. 90 tablet 1   metoprolol  tartrate (LOPRESSOR ) 100 MG tablet Take 1 tablet (100 mg total) by mouth 2 (two) times daily. 180 tablet 1   olmesartan  (BENICAR ) 40 MG tablet Take 1 tablet (40 mg total)  by mouth daily. 90 tablet 1   omeprazole  (PRILOSEC) 20 MG capsule Take 1 capsule (20 mg total) by mouth at bedtime. 90 capsule 3   No current facility-administered medications for this visit.    Allergies as of 09/20/2024   (No Known Allergies)    Family History  Problem Relation Age of Onset   Hypertension Mother    Thyroid  disease Mother    Stroke Father 12   Liver cancer Maternal Uncle    Heart disease Brother    Hypertension Brother     Social History   Socioeconomic History   Marital status: Married    Spouse name: [place]   Number of children: Not on file   Years of education: 12   Highest education level: Some college, no degree  Occupational History   Not on file  Tobacco Use   Smoking status: Never   Smokeless tobacco: Never  Vaping Use   Vaping status: Never Used  Substance and Sexual Activity   Alcohol use: Yes    Alcohol/week: 15.0 standard drinks of alcohol    Types: 15 Standard drinks or equivalent per week    Comment: quit ETOH 03/12/2020   Drug use: Not Currently    Types: Marijuana    Comment: gummy, last week   Sexual activity: Not Currently  Other Topics Concern   Not on file  Social History Narrative   alcohol use- cut back April 2021.  Lives with his wife at home.   No lung dx hx, grew up with second hand smoke, wife smokes out of home   Social Drivers of Health   Financial Resource Strain: Low Risk  (10/18/2023)   Overall Financial Resource Strain (CARDIA)    Difficulty of Paying Living Expenses: Not hard at all  Food Insecurity: No Food Insecurity (10/18/2023)   Hunger Vital Sign    Worried About Running Out of Food in the Last Year: Never true    Ran Out of Food in the Last Year: Never true  Transportation Needs: No Transportation Needs (10/18/2023)   PRAPARE - Administrator, Civil Service (Medical): No    Lack of Transportation (Non-Medical): No  Physical Activity: Sufficiently Active (10/18/2023)   Exercise Vital  Sign    Days of Exercise per Week: 5 days    Minutes of Exercise per Session: 30 min  Stress: No Stress Concern Present (10/18/2023)   [employer] of Occupational Health - Occupational Stress Questionnaire    Feeling of Stress : Not at all  Social Connections: Moderately Isolated (10/18/2023)   Social Connection and Isolation Panel    Frequency of Communication with Friends and Family: More than three times a week    Frequency of Social Gatherings with Friends and Family: More than three times a week    Attends Religious Services: Never    Database administrator or Organizations: No    Attends Banker Meetings: Never    Marital Status: Married  Catering manager Violence: Not  At Risk (10/18/2023)   Humiliation, Afraid, Rape, and Kick questionnaire    Fear of Current or Ex-Partner: No    Emotionally Abused: No    Physically Abused: No    Sexually Abused: No     RELEVANT GI HISTORY, IMAGING AND LABS: CBC    Component Value Date/Time   WBC 7.3 07/23/2024 0000   RBC 5.07 07/23/2024 0000   HGB 15.7 07/23/2024 0000   HGB 13.0 04/26/2019 1116   HCT 46.6 07/23/2024 0000   HCT 42.9 04/26/2019 1116   PLT 200 07/23/2024 0000   PLT 296 04/26/2019 1116   MCV 91.9 07/23/2024 0000   MCV 78 (L) 04/26/2019 1116   MCH 31.0 07/23/2024 0000   MCHC 33.7 07/23/2024 0000   RDW 13.6 07/23/2024 0000   LYMPHSABS 941 04/12/2023 1553   MONOABS 0.9 02/19/2020 1030   EOSABS 219 07/23/2024 0000   BASOSABS 37 07/23/2024 0000   Recent Labs    10/18/23 1545 07/23/24 0000  HGB 15.5 15.7    CMP     Component Value Date/Time   NA 132 (L) 07/23/2024 0000   NA 138 04/13/2019 1156   K 4.7 07/23/2024 0000   CL 93 (L) 07/23/2024 0000   CO2 28 07/23/2024 0000   GLUCOSE 100 (H) 07/23/2024 0000   BUN 14 07/23/2024 0000   BUN 8 04/13/2019 1156   CREATININE 0.97 07/23/2024 0000   CALCIUM 9.5 07/23/2024 0000   PROT 7.1 07/23/2024 0000   PROT 6.7 06/13/2019 1337   ALBUMIN  3.2 (L)  03/14/2020 0550   ALBUMIN  4.0 06/13/2019 1337   AST 16 07/23/2024 0000   ALT 20 07/23/2024 0000   ALKPHOS 31 (L) 03/14/2020 0550   BILITOT 1.9 (H) 07/23/2024 0000   BILITOT 1.2 07/23/2020 1541   GFRNONAA 79 01/19/2021 1205   GFRAA 92 01/19/2021 1205      Latest Ref Rng & Units 07/23/2024   12:00 AM 10/18/2023    3:45 PM 08/23/2023    3:11 PM  Hepatic Function  Total Protein 6.1 - 8.1 g/dL 7.1  7.2  7.2   AST 10 - 35 U/L 16  15  15    ALT 9 - 46 U/L 20  17  16    Total Bilirubin 0.2 - 1.2 mg/dL 1.9  1.5  1.4       Review of Systems   All systems reviewed and negative except where noted in HPI.    Physical Exam  BP 126/81   Pulse 84   Temp 97.7 F (36.5 C)   Ht 5' 8 (1.727 m)   Wt 238 lb 6.4 oz (108.1 kg)   SpO2 100%   BMI 36.25 kg/m  No LMP for male patient. General:   Alert and oriented. Pleasant and cooperative. Well-nourished and well-developed. Obese. In no acute distress. Head:  Normocephalic and atraumatic. Eyes:  Without icterus Ears:  Normal auditory acuity. Lungs:  Respirations even and unlabored.  Clear throughout to auscultation.   No wheezes, crackles, or rhonchi. No acute distress. Heart:  Regular rate and rhythm; no murmurs, clicks, rubs, or gallops. Abdomen:  Normal bowel sounds.  No bruits.  Soft, non-tender and non-distended without masses, hepatosplenomegaly or hernias noted.  No guarding or rebound tenderness.   Rectal:  Deferred. Msk:  Symmetrical without gross deformities. Normal posture. Extremities:  Without edema. Neurologic:  Alert and  oriented x4;  grossly normal neurologically. Skin:  Intact without significant lesions or rashes. Psych:  Alert and cooperative. Normal mood and  affect.   Assessment & Plan   Layson Bertsch is a 58 y.o. male presenting today for follow-up on Barrett's esophagus and occasional bright red blood in stool.    Barretts esophagus.  Denies GERD symptoms at this time.  Has not been taking PPI. - Schedule EGD for  surveillance. I discussed risks of EGD with patient today, including risk of sedation, bleeding or perforation. Patient provides understanding and gave verbal consent to proceed.   Occasional Rectal bleeding.  History of hemorrhoids, will see small amount of BRB in toilet or on toilet paper if he has a hard stool or strains.  Denies rectal pain or itching.  - Increase water intake - Increase fiber in diet and taking Benefiber daily.  - Limit toilet time - Patient will follow-up if bleeding becomes more frequent  Follow-up as needed  Grayce Bohr, DNP, AGNP-C [facility] Gastroenterology

## 2024-09-20 ENCOUNTER — Encounter: Payer: Self-pay | Admitting: Family Medicine

## 2024-09-20 ENCOUNTER — Ambulatory Visit: Admitting: Family Medicine

## 2024-09-20 VITALS — BP 126/81 | HR 84 | Temp 97.7°F | Ht 68.0 in | Wt 238.4 lb

## 2024-09-20 DIAGNOSIS — K2270 Barrett's esophagus without dysplasia: Secondary | ICD-10-CM

## 2024-09-20 DIAGNOSIS — K625 Hemorrhage of anus and rectum: Secondary | ICD-10-CM

## 2024-09-20 DIAGNOSIS — Z8719 Personal history of other diseases of the digestive system: Secondary | ICD-10-CM

## 2024-09-20 NOTE — Patient Instructions (Addendum)
 I will call to schedule the EGD when I have openings.   Start Benefiber Mix 1 TBSP in a drink daily. Recommend High Fiber diet with fruits, vegetables, and whole grains. Drink 64 ounces of Fluids Daily.

## 2024-09-26 ENCOUNTER — Other Ambulatory Visit: Payer: Self-pay

## 2024-09-26 DIAGNOSIS — Z8719 Personal history of other diseases of the digestive system: Secondary | ICD-10-CM

## 2024-10-03 ENCOUNTER — Ambulatory Visit: Admitting: Cardiology

## 2024-10-03 NOTE — Progress Notes (Deleted)
  Electrophysiology Office Follow up Visit Note:    Date:  10/03/2024   ID:  Dean Anderson, DOB 1966/01/09, MRN [id number]  PCP:  Leavy Mole, PA-C  [facility] Cardiologist:  Redell Cave, MD  [facility] Electrophysiologist:  OLE ONEIDA HOLTS, MD    Interval History:     Dean Anderson is a 58 y.o. male who presents for a follow up visit.   I last saw the patient February 29, 2024.  The patient has a history of persistent atrial fibrillation not on anticoagulation given history of GI bleeding.  At the last appointment we deemed his atrial fibrillation permanent.        Past medical, surgical, social and family history were reviewed.  ROS:   Please see the history of present illness.    All other systems reviewed and are negative.  EKGs/Labs/Other Studies Reviewed:    The following studies were reviewed today:          Physical Exam:    VS:  There were no vitals taken for this visit.    Wt Readings from Last 3 Encounters:  09/20/24 238 lb 6.4 oz (108.1 kg)  07/23/24 237 lb (107.5 kg)  02/29/24 237 lb 12.8 oz (107.9 kg)     GEN: no distress CARD: RRR, No MRG RESP: No IWOB. CTAB.      ASSESSMENT:    No diagnosis found. PLAN:    In order of problems listed above:     Signed, Ole Holts, MD, [facility], [facility] 10/03/2024 8:45 AM    Electrophysiology [facility]

## 2024-10-25 ENCOUNTER — Ambulatory Visit: Payer: Self-pay

## 2024-10-25 NOTE — Telephone Encounter (Signed)
 FYI Only or Action Required?: FYI only for provider: pt will go to [facility].  Patient was last seen in primary care on 07/23/2024 by Tapia, Leisa, PA-C.  Called Nurse Triage reporting Shortness of Breath. And cough  Symptoms began several months ago. - worsening the past 2-3 weeks  Interventions attempted: Nothing.  Symptoms are: gradually worsening.  Triage Disposition: See HCP Within 4 Hours (Or PCP Triage)  Patient/caregiver understands and will follow disposition?: yes                      Copied from CRM [id number]. Topic: Clinical - Red Word Triage >> Oct 25, 2024  3:59 PM Pinkey ORN wrote: Red Word that prompted transfer to Nurse Triage: Difficulty Breathing Reason for Disposition  [1] MILD difficulty breathing (e.g., minimal/no SOB at rest, SOB with walking, pulse < 100) AND [2] NEW-onset or WORSE than normal  Answer Assessment - Initial Assessment Questions 1. RESPIRATORY STATUS: Describe your breathing? (e.g., wheezing, shortness of breath, unable to speak, severe coughing)      Easily winded 2. ONSET: When did this breathing problem begin?      Building up for the past 2-3 months. Worsening 2-3 weeks 3. PATTERN Does the difficult breathing come and go, or has it been constant since it started?      Constant but worsening with exertion 4. SEVERITY: How bad is your breathing? (e.g., mild, moderate, severe)      Mild moderate 5. RECURRENT SYMPTOM: Have you had difficulty breathing before? If Yes, ask: When was the last time? and What happened that time?      yes 6. CARDIAC HISTORY: Do you have any history of heart disease? (e.g., heart attack, angina, bypass surgery, angioplasty)      afib 7. LUNG HISTORY: Do you have any history of lung disease?  (e.g., pulmonary embolus, asthma, emphysema)     Had water in chest 2018 8. CAUSE: What do you think is causing the breathing problem?      unsure 9. OTHER SYMPTOMS: Do you have any other  symptoms? (e.g., chest pain, cough, dizziness, fever, runny nose)     cough 10. O2 SATURATION MONITOR:  Do you use an oxygen  saturation monitor (pulse oximeter) at home? If Yes, ask: What is your reading (oxygen  level) today? What is your usual oxygen  saturation reading? (e.g., 95%)       no  Protocols used: Breathing Difficulty-A-AH

## 2024-10-31 ENCOUNTER — Encounter: Payer: Self-pay | Admitting: Gastroenterology

## 2024-11-05 ENCOUNTER — Ambulatory Visit: Admitting: Anesthesiology

## 2024-11-05 ENCOUNTER — Ambulatory Visit
Admission: RE | Admit: 2024-11-05 | Discharge: 2024-11-05 | Disposition: A | Discharge status: Home / Self Care | Attending: Gastroenterology | Admitting: Gastroenterology

## 2024-11-05 ENCOUNTER — Encounter: Payer: Self-pay | Admitting: Gastroenterology

## 2024-11-05 ENCOUNTER — Encounter
Admission: RE | Disposition: A | Discharge status: Home / Self Care | Payer: Self-pay | Source: Home / Self Care | Attending: Gastroenterology

## 2024-11-05 DIAGNOSIS — G4730 Sleep apnea, unspecified: Secondary | ICD-10-CM | POA: Diagnosis not present

## 2024-11-05 DIAGNOSIS — K2270 Barrett's esophagus without dysplasia: Secondary | ICD-10-CM | POA: Diagnosis not present

## 2024-11-05 DIAGNOSIS — K209 Esophagitis, unspecified without bleeding: Secondary | ICD-10-CM

## 2024-11-05 DIAGNOSIS — Z8719 Personal history of other diseases of the digestive system: Secondary | ICD-10-CM

## 2024-11-05 DIAGNOSIS — K31A19 Gastric intestinal metaplasia without dysplasia, unspecified site: Secondary | ICD-10-CM | POA: Diagnosis not present

## 2024-11-05 DIAGNOSIS — F1090 Alcohol use, unspecified, uncomplicated: Secondary | ICD-10-CM

## 2024-11-05 DIAGNOSIS — E039 Hypothyroidism, unspecified: Secondary | ICD-10-CM | POA: Diagnosis not present

## 2024-11-05 DIAGNOSIS — K2950 Unspecified chronic gastritis without bleeding: Secondary | ICD-10-CM

## 2024-11-05 DIAGNOSIS — K2900 Acute gastritis without bleeding: Secondary | ICD-10-CM | POA: Diagnosis present

## 2024-11-05 DIAGNOSIS — I4819 Other persistent atrial fibrillation: Secondary | ICD-10-CM | POA: Diagnosis not present

## 2024-11-05 DIAGNOSIS — K2970 Gastritis, unspecified, without bleeding: Secondary | ICD-10-CM | POA: Diagnosis not present

## 2024-11-05 DIAGNOSIS — I4891 Unspecified atrial fibrillation: Secondary | ICD-10-CM | POA: Diagnosis not present

## 2024-11-05 DIAGNOSIS — I10 Essential (primary) hypertension: Secondary | ICD-10-CM | POA: Diagnosis not present

## 2024-11-05 DIAGNOSIS — K219 Gastro-esophageal reflux disease without esophagitis: Secondary | ICD-10-CM | POA: Diagnosis not present

## 2024-11-05 HISTORY — PX: ESOPHAGOGASTRODUODENOSCOPY: SHX5428

## 2024-11-05 SURGERY — EGD (ESOPHAGOGASTRODUODENOSCOPY)
Anesthesia: General

## 2024-11-05 MED ORDER — PROPOFOL 500 MG/50ML IV EMUL: INTRAVENOUS | Status: DC | PRN

## 2024-11-05 MED ORDER — LIDOCAINE HCL (PF) 2 % IJ SOLN
INTRAMUSCULAR | Status: AC
  Filled 2024-11-05: qty 5

## 2024-11-05 MED ORDER — LIDOCAINE HCL (CARDIAC) PF 100 MG/5ML IV SOSY
PREFILLED_SYRINGE | INTRAVENOUS | Status: DC | PRN
  Administered 2024-11-05: 100 mg via INTRAVENOUS

## 2024-11-05 MED ORDER — PROPOFOL 10 MG/ML IV BOLUS
INTRAVENOUS | Status: DC | PRN
  Administered 2024-11-05 (×2): 50 mg via INTRAVENOUS
  Administered 2024-11-05: 100 mg via INTRAVENOUS

## 2024-11-05 MED ORDER — PROPOFOL 1000 MG/100ML IV EMUL
INTRAVENOUS | Status: AC
  Filled 2024-11-05: qty 100

## 2024-11-05 MED ORDER — SODIUM CHLORIDE 0.9 % IV SOLN
INTRAVENOUS | Status: DC
  Administered 2024-11-05: 500 mL via INTRAVENOUS

## 2024-11-05 NOTE — H&P (Signed)
 Rogelia Copping, MD [facility] [address], [address] [place], [place] [address] Phone:[phone] Fax : [phone]  Primary Care Physician:  Leavy Mole, PA-C (Inactive) Primary Gastroenterologist:  Dr. Copping  Pre-Procedure History & Physical: HPI:  Dean Anderson is a 58 y.o. male is here for an endoscopy.   Past Medical History:  Diagnosis Date   Acute gastritis without hemorrhage    Advanced care planning/counseling discussion 10/18/2023   Anemia    Atrial fibrillation (HCC)    Dyspnea    Family history of adverse reaction to anesthesia    brother woke up in a rage after geting anesthesia for teeth surgery   GERD (gastroesophageal reflux disease)    Hematochezia    History of hiatal hernia    Hypertension    Hypothyroidism    Pleural effusion on right 01/11/2020    Onset of symptoms early 2021 assoc with hbp/afib with neg cards w/u 02/14/20  - Thoracentesis 02/20/2020 >>>   2000 cc  Wbc 267  L > polys  Exudative by protein  with glucose wnl  Cytology >  Still large effusion  Present with loculations > refer to T surgery for ? VATS   Thyroid  disease     Past Surgical History:  Procedure Laterality Date   COLONOSCOPY WITH PROPOFOL  N/A 04/17/2019   Procedure: COLONOSCOPY WITH PROPOFOL ;  Surgeon: Copping Rogelia, MD;  Location: [facility];  Service: Endoscopy;  Laterality: N/A;   ESOPHAGOGASTRODUODENOSCOPY N/A 03/12/2020   Procedure: ESOPHAGOGASTRODUODENOSCOPY (EGD);  Surgeon: Shyrl Linnie KIDD, MD;  [facility];  Service: Thoracic;  Laterality: N/A;   ESOPHAGOGASTRODUODENOSCOPY (EGD) WITH PROPOFOL  N/A 04/17/2019   Procedure: ESOPHAGOGASTRODUODENOSCOPY (EGD) WITH PROPOFOL ;  Surgeon: Copping Rogelia, MD;  Location: [facility] END[facility]COPY;  Service: Endoscopy;  Laterality: N/A;   GIVENS CAPSULE STUDY  05/2019   negative   HERNIA REPAIR     VIDEO ASSISTED THORACOSCOPY (VATS)/DECORTICATION Right 03/12/2020   Procedure: VIDEO ASSISTED THORACOSCOPY (VATS)/DRAINING OF AN EMPYEMA;  Surgeon:  Shyrl Linnie KIDD, MD;  Location: [facility] OR;  Service: Thoracic;  Laterality: Right;   XI ROBOTIC ASSISTED HIATAL HERNIA REPAIR N/A 03/12/2020   Procedure: XI ROBOTIC ASSISTED HIATAL HERNIA REPAIR USING ACELL MATRIX HIATAL;  Surgeon: Shyrl Linnie KIDD, MD;  Location: [facility] OR;  Service: Thoracic;  Laterality: N/A;    Prior to Admission medications   Medication Sig Start Date End Date Taking? Authorizing Provider  levothyroxine  (SYNTHROID ) 112 MCG tablet Take 1 tablet (112 mcg total) by mouth daily before breakfast. 07/24/24  Yes Tapia, Leisa, PA-C  metoprolol  tartrate (LOPRESSOR ) 100 MG tablet Take 1 tablet (100 mg total) by mouth 2 (two) times daily. 08/02/24  Yes Tapia, Leisa, PA-C  olmesartan  (BENICAR ) 40 MG tablet Take 1 tablet (40 mg total) by mouth daily. 07/24/24  Yes Tapia, Leisa, PA-C  omeprazole  (PRILOSEC) 20 MG capsule Take 1 capsule (20 mg total) by mouth at bedtime. 07/24/24  Yes Tapia, Leisa, PA-C    Allergies as of 09/26/2024   (No Known Allergies)    Family History  Problem Relation Age of Onset   Hypertension Mother    Thyroid  disease Mother    Stroke Father 79   Liver cancer Maternal Uncle    Heart disease Brother    Hypertension Brother     Social History   Socioeconomic History   Marital status: Married    Spouse name: Burnedette   Number of children: Not on file   Years of education: 12   Highest education level: Some college, no degree  Occupational History   Not on file  Tobacco Use   Smoking status: Never   Smokeless tobacco: Never  Vaping Use   Vaping status: Never Used  Substance and Sexual Activity   Alcohol use: Yes    Alcohol/week: 15.0 standard drinks of alcohol    Types: 15 Standard drinks or equivalent per week    Comment: occass.   Drug use: Not Currently    Types: Marijuana    Comment: gummy, last week   Sexual activity: Not Currently  Other Topics Concern   Not on file  Social History Narrative   alcohol use- cut back April 2021.   Lives with his wife at home.   No lung dx hx, grew up with second hand smoke, wife smokes out of home   Social Drivers of Health   Financial Resource Strain: Low Risk  (10/18/2023)   Overall Financial Resource Strain (CARDIA)    Difficulty of Paying Living Expenses: Not hard at all  Food Insecurity: No Food Insecurity (10/18/2023)   Hunger Vital Sign    Worried About Running Out of Food in the Last Year: Never true    Ran Out of Food in the Last Year: Never true  Transportation Needs: No Transportation Needs (10/18/2023)   PRAPARE - Administrator, Civil Service (Medical): No    Lack of Transportation (Non-Medical): No  Physical Activity: Sufficiently Active (10/18/2023)   Exercise Vital Sign    Days of Exercise per Week: 5 days    Minutes of Exercise per Session: 30 min  Stress: No Stress Concern Present (10/18/2023)   [employer] of Occupational Health - Occupational Stress Questionnaire    Feeling of Stress : Not at all  Social Connections: Moderately Isolated (10/18/2023)   Social Connection and Isolation Panel    Frequency of Communication with Friends and Family: More than three times a week    Frequency of Social Gatherings with Friends and Family: More than three times a week    Attends Religious Services: Never    Database Administrator or Organizations: No    Attends Banker Meetings: Never    Marital Status: Married  Catering Manager Violence: Not At Risk (10/18/2023)   Humiliation, Afraid, Rape, and Kick questionnaire    Fear of Current or Ex-Partner: No    Emotionally Abused: No    Physically Abused: No    Sexually Abused: No    Review of Systems: See HPI, otherwise negative ROS  Physical Exam: BP (!) 162/117   Pulse 64   Temp (!) 96.6 F (35.9 C) (Temporal)   Resp 18   Ht 5' 8 (1.727 m)   Wt 107.7 kg   SpO2 99%   BMI 36.10 kg/m  General:   Alert,  pleasant and cooperative in NAD Head:  Normocephalic and  atraumatic. Neck:  Supple; no masses or thyromegaly. Lungs:  Clear throughout to auscultation.    Heart:  Regular rate and rhythm. Abdomen:  Soft, nontender and nondistended. Normal bowel sounds, without guarding, and without rebound.   Neurologic:  Alert and  oriented x4;  grossly normal neurologically.  Impression/Plan: Dean Anderson is here for an endoscopy to be performed for barrett's esophagus  Risks, benefits, limitations, and alternatives regarding  endoscopy have been reviewed with the patient.  Questions have been answered.  All parties agreeable.   Rogelia Copping, MD  11/05/2024, 8:11 AM

## 2024-11-05 NOTE — Op Note (Signed)
 [facility] Gastroenterology Patient Name: Dean Anderson Procedure Date: 11/05/2024 8:05 AM MRN: [id number] Account #: [id number] Date of Birth: 1966/04/21 Admit Type: Outpatient Age: 58 Room: [facility] ENDO ROOM 4 Gender: Male Note Status: Finalized Instrument Name: Endoscope [id number] Procedure:             Upper GI endoscopy Indications:           Follow-up of Barrett's esophagus Providers:             Rogelia Copping MD, MD Referring MD:          Rogelia Copping MD, MD (Referring MD), Michelene Cower                         (Referring MD) Medicines:             Propofol  per Anesthesia Complications:         No immediate complications. Procedure:             Pre-Anesthesia Assessment:                        - Prior to the procedure, a History and Physical was                         performed, and patient medications and allergies were                         reviewed. The patient's tolerance of previous                         anesthesia was also reviewed. The risks and benefits                         of the procedure and the sedation options and risks                         were discussed with the patient. All questions were                         answered, and informed consent was obtained. Prior                         Anticoagulants: The patient has taken no anticoagulant                         or antiplatelet agents. ASA Grade Assessment: II - A                         patient with mild systemic disease. After reviewing                         the risks and benefits, the patient was deemed in                         satisfactory condition to undergo the procedure.                        After obtaining informed consent, the endoscope was  passed under direct vision. Throughout the procedure,                         the patient's blood pressure, pulse, and oxygen                          saturations were monitored continuously. The Endoscope                          was introduced through the mouth, and advanced to the                         second part of duodenum. The upper GI endoscopy was                         accomplished without difficulty. The patient tolerated                         the procedure well. Findings:      There were esophageal mucosal changes consistent with long-segment       Barrett's esophagus present in the lower third of the esophagus. The       maximum longitudinal extent of these mucosal changes was 7 cm in length.       Mucosa was biopsied with a cold forceps for histology in 4 quadrants at       intervals of 1 cm from 33 to 40 cm from the incisors.      Diffuse moderate inflammation characterized by erosions was found in the       gastric antrum. Biopsies were taken with a cold forceps for histology.      The examined duodenum was normal. Impression:            - Esophageal mucosal changes consistent with                         long-segment Barrett's esophagus. Biopsied.                        - Gastritis. Biopsied.                        - Normal examined duodenum. Recommendation:        - Discharge patient to home.                        - Resume previous diet.                        - Continue present medications.                        - Await pathology results.                        - Repeat upper endoscopy in 3 years for surveillance. Procedure Code(s):     --- Professional ---                        [phone], Esophagogastroduodenoscopy, flexible,  transoral; with biopsy, single or multiple Diagnosis Code(s):     --- Professional ---                        K22.70, Barrett's esophagus without dysplasia                        K29.70, Gastritis, unspecified, without bleeding CPT copyright 2022 American Medical Association. All rights reserved. The codes documented in this report are preliminary and upon coder review may  be revised to meet current compliance  requirements. Rogelia Copping MD, MD 11/05/2024 8:30:38 AM This report has been signed electronically. Number of Addenda: 0 Note Initiated On: 11/05/2024 8:05 AM Estimated Blood Loss:  Estimated blood loss: none.      [facility]

## 2024-11-05 NOTE — Transfer of Care (Addendum)
 Immediate Anesthesia Transfer of Care Note  Patient: Dean Anderson  Procedure(s) Performed: EGD (ESOPHAGOGASTRODUODENOSCOPY)  Patient Location: PACU  Anesthesia Type:General  Level of Consciousness: awake and sedated  Airway & Oxygen  Therapy: Patient Spontanous Breathing and Patient connected to face mask oxygen   Post-op Assessment: Report given to RN and Post -op Vital signs reviewed and stable  Post vital signs: Reviewed and stable  Last Vitals:  Vitals Value Taken Time  BP    Temp    Pulse    Resp    SpO2      Last Pain:  Vitals:   11/05/24 0715  TempSrc: Temporal  PainSc: 0-No pain         Complications: There were no known notable events for this encounter.

## 2024-11-05 NOTE — Anesthesia Preprocedure Evaluation (Signed)
 Anesthesia Evaluation  Patient identified by MRN, date of birth, ID band Patient awake    Reviewed: Allergy & Precautions, NPO status , Patient's Chart, lab work & pertinent test results  History of Anesthesia Complications Negative for: history of anesthetic complications  Airway Mallampati: III  TM Distance: <3 FB Neck ROM: full    Dental  (+) Chipped, Poor Dentition, Missing   Pulmonary neg shortness of breath, sleep apnea    Pulmonary exam normal        Cardiovascular Exercise Tolerance: Good hypertension, (-) angina + dysrhythmias Atrial Fibrillation      Neuro/Psych negative neurological ROS  negative psych ROS   GI/Hepatic Neg liver ROS, hiatal hernia,GERD  Controlled,,  Endo/Other  Hypothyroidism    Renal/GU negative Renal ROS  negative genitourinary   Musculoskeletal   Abdominal   Peds  Hematology negative hematology ROS (+)   Anesthesia Other Findings Past Medical History: No date: Acute gastritis without hemorrhage 10/18/2023: Advanced care planning/counseling discussion No date: Anemia No date: Atrial fibrillation (HCC) No date: Dyspnea No date: Family history of adverse reaction to anesthesia     Comment:  brother woke up in a rage after geting anesthesia for               teeth surgery No date: GERD (gastroesophageal reflux disease) No date: Hematochezia No date: History of hiatal hernia No date: Hypertension No date: Hypothyroidism 01/11/2020: Pleural effusion on right     Comment:   Onset of symptoms early 2021 assoc with hbp/afib with               neg cards w/u 02/14/20  - Thoracentesis 02/20/2020 >>>                 2000 cc  Wbc 267  L > polys  Exudative by protein  with               glucose wnl  Cytology >  Still large effusion  Present               with loculations > refer to T surgery for ? VATS No date: Thyroid  disease  Past Surgical History: 04/17/2019: COLONOSCOPY WITH  PROPOFOL ; N/A     Comment:  Procedure: COLONOSCOPY WITH PROPOFOL ;  Surgeon: Jinny Carmine, MD;  Location: [facility] ENDOSCOPY;  Service:               Endoscopy;  Laterality: N/A; 03/12/2020: ESOPHAGOGASTRODUODENOSCOPY; N/A     Comment:  Procedure: ESOPHAGOGASTRODUODENOSCOPY (EGD);  Surgeon:               Shyrl Linnie KIDD, MD;  Location: [facility] OR;  Service:               Thoracic;  Laterality: N/A; 04/17/2019: ESOPHAGOGASTRODUODENOSCOPY (EGD) WITH PROPOFOL ; N/A     Comment:  Procedure: ESOPHAGOGASTRODUODENOSCOPY (EGD) WITH               PROPOFOL ;  Surgeon: Jinny Carmine, MD;  Location: [facility]               ENDOSCOPY;  Service: Endoscopy;  Laterality: N/A; 05/2019: GIVENS CAPSULE STUDY     Comment:  negative No date: HERNIA REPAIR 03/12/2020: VIDEO ASSISTED THORACOSCOPY (VATS)/DECORTICATION; Right     Comment:  Procedure: VIDEO ASSISTED THORACOSCOPY (VATS)/DRAINING               OF AN EMPYEMA;  Surgeon:  Shyrl Linnie KIDD, MD;                Location: [facility] OR;  Service: Thoracic;  Laterality: Right; 03/12/2020: XI ROBOTIC ASSISTED HIATAL HERNIA REPAIR; N/A     Comment:  Procedure: XI ROBOTIC ASSISTED HIATAL HERNIA REPAIR               USING ACELL MATRIX HIATAL;  Surgeon: Shyrl Linnie KIDD, MD;  Location: [facility] OR;  Service: Thoracic;  Laterality:              N/A;     Reproductive/Obstetrics negative OB ROS                              Anesthesia Physical Anesthesia Plan  ASA: 3  Anesthesia Plan: General   Post-op Pain Management:    Induction: Intravenous  PONV Risk Score and Plan: Propofol  infusion and TIVA  Airway Management Planned: Natural Airway and Nasal Cannula  Additional Equipment:   Intra-op Plan:   Post-operative Plan:   Informed Consent: I have reviewed the patients History and Physical, chart, labs and discussed the procedure including the risks, benefits and alternatives for the proposed anesthesia with the  patient or authorized representative who has indicated his/her understanding and acceptance.     Dental Advisory Given  Plan Discussed with: Anesthesiologist, CRNA and Surgeon  Anesthesia Plan Comments: (Patient consented for risks of anesthesia including but not limited to:  - adverse reactions to medications - risk of airway placement if required - damage to eyes, teeth, lips or other oral mucosa - nerve damage due to positioning  - sore throat or hoarseness - Damage to heart, brain, nerves, lungs, other parts of body or loss of life  Patient voiced understanding and assent.)        Anesthesia Quick Evaluation

## 2024-11-05 NOTE — Anesthesia Postprocedure Evaluation (Signed)
 Anesthesia Post Note  Patient: Dean Anderson  Procedure(s) Performed: EGD (ESOPHAGOGASTRODUODENOSCOPY)  Patient location during evaluation: Endoscopy Anesthesia Type: General Level of consciousness: awake and alert Pain management: pain level controlled Vital Signs Assessment: post-procedure vital signs reviewed and stable Respiratory status: spontaneous breathing, nonlabored ventilation and respiratory function stable Cardiovascular status: blood pressure returned to baseline and stable Postop Assessment: no apparent nausea or vomiting Anesthetic complications: no   There were no known notable events for this encounter.   Last Vitals:  Vitals:   11/05/24 0833 11/05/24 0903  BP: (!) 84/66   Pulse: (!) 106   Resp: (!) 21   Temp: (!) 35.6 C   SpO2: 95% 99%    Last Pain:  Vitals:   11/05/24 0903  TempSrc:   PainSc: 0-No pain                 Fairy POUR Briggs Edelen

## 2024-11-06 LAB — SURGICAL PATHOLOGY

## 2024-11-07 ENCOUNTER — Ambulatory Visit: Payer: Self-pay | Admitting: Gastroenterology

## 2024-12-25 ENCOUNTER — Encounter: Payer: Self-pay | Admitting: Cardiology

## 2024-12-25 ENCOUNTER — Telehealth: Payer: Self-pay

## 2024-12-25 ENCOUNTER — Ambulatory Visit: Admitting: Cardiology

## 2024-12-25 NOTE — Progress Notes (Unsigned)
 This encounter was created in error - please disregard.

## 2024-12-25 NOTE — Telephone Encounter (Signed)
 I attempted to call Mr. Dean Anderson too see if he would like to keep or cancel the Electrophysiology appointment today with Suzann Riddle, NP. The last note from Dr. Cindie was to follow up as needed with EP. Dr. Darliss was supposed to have a one year follow up for January 2026. No voicemail available. My Chart Message sent.

## 2024-12-28 ENCOUNTER — Encounter: Payer: Self-pay | Admitting: Cardiology

## 2024-12-28 ENCOUNTER — Ambulatory Visit: Attending: Cardiology | Admitting: Cardiology

## 2024-12-28 VITALS — BP 128/92 | HR 93 | Ht 68.0 in | Wt 232.0 lb

## 2024-12-28 DIAGNOSIS — I10 Essential (primary) hypertension: Secondary | ICD-10-CM | POA: Insufficient documentation

## 2024-12-28 DIAGNOSIS — I4819 Other persistent atrial fibrillation: Secondary | ICD-10-CM | POA: Diagnosis present

## 2024-12-28 MED ORDER — ASPIRIN 81 MG PO TBEC: 81.0000 mg | DELAYED_RELEASE_TABLET | Freq: Every day | ORAL | Status: AC

## 2024-12-28 MED ORDER — OLMESARTAN MEDOXOMIL-HCTZ 40-12.5 MG PO TABS: 1.0000 | ORAL_TABLET | Freq: Every day | ORAL | 3 refills | Status: AC

## 2024-12-28 NOTE — Progress Notes (Unsigned)
 " Cardiology Office Note:    Date:  12/28/2024   ID:  Dean Anderson, DOB 06-Jul-1966, MRN [id number]  PCP:  Leavy Mole, PA-C (Inactive)  Cardiologist:  Redell Cave, MD  Electrophysiologist:  OLE ONEIDA HOLTS, MD (Inactive)   Referring MD: No ref. provider found   Chief Complaint  Patient presents with   Follow-up    12 month follow up visit. Patient is doing well on today. Meds reviewed.     History of Present Illness:    Dean Anderson is a 59 y.o. male with a hx of persistent A. Fib (not on anticoagulation due to GI bleeds.), hypertension, Barrett's esophagus, OSA who presents for follow-up.  Patient denies chest pain or palpitations, denies GI bleeds.  States having leg edema of late.  Was previously on HCTZ 12.5 mg daily, this was stopped.  Compliant with metoprolol , olmesartan  as prescribed.  BP well-controlled at home.   Prior notes Echo 04/2020 showed normal systolic function, EF 60 to 65%, moderate LA dilatation, aortic valve sclerosis. Lexiscan  Myoview  02/2020 no evidence for ischemia, low risk scan.  Since diagnosis of A. fib around 2019, he was  put on blood thinners but subsequently developed an anemia.  GI work-up has since been unrevealing only hemorrhoidal bleeding as possible etiology.  He states noticing blood in his stool sometimes.   History of dyspnea status post VATS draining of empyema and decortication, hiatal hernia repair.    Past Medical History:  Diagnosis Date   Acute gastritis without hemorrhage    Advanced care planning/counseling discussion 10/18/2023   Anemia    Atrial fibrillation (HCC)    Dyspnea    Family history of adverse reaction to anesthesia    brother woke up in a rage after geting anesthesia for teeth surgery   GERD (gastroesophageal reflux disease)    Hematochezia    History of hiatal hernia    Hypertension    Hypothyroidism    Pleural effusion on right 01/11/2020    Onset of symptoms early 2021 assoc with hbp/afib with neg  cards w/u 02/14/20  - Thoracentesis 02/20/2020 >>>   2000 cc  Wbc 267  L > polys  Exudative by protein  with glucose wnl  Cytology >  Still large effusion  Present with loculations > refer to T surgery for ? VATS   Thyroid  disease     Past Surgical History:  Procedure Laterality Date   COLONOSCOPY WITH PROPOFOL  N/A 04/17/2019   Procedure: COLONOSCOPY WITH PROPOFOL ;  Surgeon: Jinny Carmine, MD;  Location: [facility] ENDOSCOPY;  Service: Endoscopy;  Laterality: N/A;   ESOPHAGOGASTRODUODENOSCOPY N/A 03/12/2020   Procedure: ESOPHAGOGASTRODUODENOSCOPY (EGD);  Surgeon: Shyrl Linnie KIDD, MD;  Location: [facility] OR;  Service: Thoracic;  Laterality: N/A;   ESOPHAGOGASTRODUODENOSCOPY N/A 11/05/2024   Procedure: EGD (ESOPHAGOGASTRODUODENOSCOPY);  Surgeon: Jinny Carmine, MD;  Location: [facility] ENDOSCOPY;  Service: Endoscopy;  Laterality: N/A;   ESOPHAGOGASTRODUODENOSCOPY (EGD) WITH PROPOFOL  N/A 04/17/2019   Procedure: ESOPHAGOGASTRODUODENOSCOPY (EGD) WITH PROPOFOL ;  Surgeon: Jinny Carmine, MD;  Location: [facility] ENDOSCOPY;  Service: Endoscopy;  Laterality: N/A;   GIVENS CAPSULE STUDY  05/2019   negative   HERNIA REPAIR     VIDEO ASSISTED THORACOSCOPY (VATS)/DECORTICATION Right 03/12/2020   Procedure: VIDEO ASSISTED THORACOSCOPY (VATS)/DRAINING OF AN EMPYEMA;  Surgeon: Shyrl Linnie KIDD, MD;  Location: [facility] OR;  Service: Thoracic;  Laterality: Right;   XI ROBOTIC ASSISTED HIATAL HERNIA REPAIR N/A 03/12/2020   Procedure: XI ROBOTIC ASSISTED HIATAL HERNIA REPAIR USING ACELL MATRIX HIATAL;  Surgeon: Shyrl Linnie KIDD,  MD;  Location: [facility] OR;  Service: Thoracic;  Laterality: N/A;    Current Medications: Current Meds  Medication Sig   levothyroxine  (SYNTHROID ) 112 MCG tablet Take 1 tablet (112 mcg total) by mouth daily before breakfast.   metoprolol  tartrate (LOPRESSOR ) 100 MG tablet Take 1 tablet (100 mg total) by mouth 2 (two) times daily.   olmesartan  (BENICAR ) 40 MG tablet Take 1 tablet (40 mg total) by mouth daily.    omeprazole  (PRILOSEC) 20 MG capsule Take 1 capsule (20 mg total) by mouth at bedtime.     Allergies:   Patient has no known allergies.   Social History   Socioeconomic History   Marital status: Married    Spouse name: [place]   Number of children: Not on file   Years of education: 12   Highest education level: Some college, no degree  Occupational History   Not on file  Tobacco Use   Smoking status: Never   Smokeless tobacco: Never  Vaping Use   Vaping status: Never Used  Substance and Sexual Activity   Alcohol use: Yes    Alcohol/week: 15.0 standard drinks of alcohol    Types: 15 Standard drinks or equivalent per week    Comment: occass.   Drug use: Not Currently    Types: Marijuana    Comment: gummy, last week   Sexual activity: Not Currently  Other Topics Concern   Not on file  Social History Narrative   alcohol use- cut back April 2021.  Lives with his wife at home.   No lung dx hx, grew up with second hand smoke, wife smokes out of home   Social Drivers of Health   Tobacco Use: Low Risk (12/28/2024)   Patient History    Smoking Tobacco Use: Never    Smokeless Tobacco Use: Never    Passive Exposure: Not on file  Financial Resource Strain: Low Risk (10/18/2023)   Overall Financial Resource Strain (CARDIA)    Difficulty of Paying Living Expenses: Not hard at all  Food Insecurity: No Food Insecurity (10/18/2023)   Hunger Vital Sign    Worried About Running Out of Food in the Last Year: Never true    Ran Out of Food in the Last Year: Never true  Transportation Needs: No Transportation Needs (10/18/2023)   PRAPARE - Administrator, Civil Service (Medical): No    Lack of Transportation (Non-Medical): No  Physical Activity: Sufficiently Active (10/18/2023)   Exercise Vital Sign    Days of Exercise per Week: 5 days    Minutes of Exercise per Session: 30 min  Stress: No Stress Concern Present (10/18/2023)   [employer] of Occupational Health -  Occupational Stress Questionnaire    Feeling of Stress : Not at all  Social Connections: Moderately Isolated (10/18/2023)   Social Connection and Isolation Panel    Frequency of Communication with Friends and Family: More than three times a week    Frequency of Social Gatherings with Friends and Family: More than three times a week    Attends Religious Services: Never    Database Administrator or Organizations: No    Attends Banker Meetings: Never    Marital Status: Married  Depression (PHQ2-9): Low Risk (10/18/2023)   Depression (PHQ2-9)    PHQ-2 Score: 0  Alcohol Screen: Low Risk (10/18/2023)   Alcohol Screen    Last Alcohol Screening Score (AUDIT): 5  Housing: Low Risk (10/18/2023)   Housing    Last Housing  Risk Score: 0  Utilities: Not At Risk (10/18/2023)   AHC Utilities    Threatened with loss of utilities: No  Health Literacy: Adequate Health Literacy (10/18/2023)   B1300 Health Literacy    Frequency of need for help with medical instructions: Never     Family History: The patient's family history includes Heart disease in his brother; Hypertension in his brother and mother; Liver cancer in his maternal uncle; Stroke (age of onset: 22) in his father; Thyroid  disease in his mother.  ROS:   Please see the history of present illness.     All other systems reviewed and are negative.  EKGs/Labs/Other Studies Reviewed:    The following studies were reviewed today:   EKG Interpretation Date/Time:  Friday December 28 2024 15:41:47 EST Ventricular Rate:  93 PR Interval:    QRS Duration:  82 QT Interval:  374 QTC Calculation: 465 R Axis:   15  Text Interpretation: Atrial fibrillation Inferior infarct , age undetermined Confirmed by Darliss Rogue ([id number]) on 12/28/2024 4:05:35 PM    Recent Labs: 07/23/2024: ALT 20; BUN 14; Creat 0.97; Hemoglobin 15.7; Platelets 200; Potassium 4.7; Sodium 132; TSH 3.47  Recent Lipid Panel    Component Value Date/Time    CHOL 183 07/23/2024 0000   CHOL 209 (H) 05/21/2020 1649   TRIG 78 07/23/2024 0000   TRIG 240 (H) 05/21/2020 1649   HDL 69 07/23/2024 0000   CHOLHDL 2.7 07/23/2024 0000   LDLCALC 97 07/23/2024 0000    Physical Exam:    VS:  BP (!) 128/92 (BP Location: Left Arm, Patient Position: Sitting)   Pulse 93   Ht 5' 8 (1.727 m)   Wt 232 lb (105.2 kg)   SpO2 99%   BMI 35.28 kg/m     Wt Readings from Last 3 Encounters:  12/28/24 232 lb (105.2 kg)  11/05/24 237 lb 6.4 oz (107.7 kg)  09/20/24 238 lb 6.4 oz (108.1 kg)     GEN:  Well nourished, well developed in no acute distress HEENT: Normal NECK: No JVD; No carotid bruits CARDIAC: Irregular irregular, no murmurs, rubs, gallops RESPIRATORY: Decreased breath sounds at the right lung base, clear anteriorly ABDOMEN: Soft, non-tender, non-distended MUSCULOSKELETAL:  1+ edema; No deformity  SKIN: Warm and dry NEUROLOGIC:  Alert and oriented x 3 PSYCHIATRIC:  Normal affect   ASSESSMENT:    1. Persistent atrial fibrillation (HCC)   2. Primary hypertension    PLAN:    In order of problems listed above:  persistent atrial fibrillation.  CHA2DS2-VASc: 1(htn).  Clinically asymptomatic.  Start aspirin  81 mg daily, continue Lopressor  100 mg twice daily.  Not on anticoagulation due to GI bleeding and anemia when anticoagulation was tried in the past. GI work-up so far has shown hemorrhoidal bleeding as only possible etiology.  Consider watchman versus NOAC for higher CHA2DS2-VASc score.. hypertension, BP controlled, leg edema noted on exam.  Was previously on HCTZ.  Start olmesartan  40-12.5 mg daily. continue Lopressor ..    Follow-up in 6 months  Total encounter time minutes  Greater than 50% was spent in counseling and coordination of care with the patient Medication management.  This note was generated in part or whole with voice recognition software. Voice recognition is usually quite accurate but there are transcription errors  that can and very often do occur. I apologize for any typographical errors that were not detected and corrected.  Medication Adjustments/Labs and Tests Ordered: Current medicines are reviewed at length with the patient today.  Concerns regarding medicines are outlined above.  Orders Placed This Encounter  Procedures   EKG 12-Lead    No orders of the defined types were placed in this encounter.    There are no Patient Instructions on file for this visit.   Signed, Redell Cave, MD  12/28/2024 4:06 PM    [facility] "

## 2024-12-28 NOTE — Patient Instructions (Signed)
 Medication Instructions:  - START aspirin  81 mg once daily  - START olmesartan -hydrochlorothiazide  40-12.5 mg once daily  *If you need a refill on your cardiac medications before your next appointment, please call your pharmacy*  Lab Work: No labs ordered today  If you have labs (blood work) drawn today and your tests are completely normal, you will receive your results only by: MyChart Message (if you have MyChart) OR A paper copy in the mail If you have any lab test that is abnormal or we need to change your treatment, we will call you to review the results.  Testing/Procedures: No test ordered today   Follow-Up: At [facility], you and your health needs are our priority.  As part of our continuing mission to provide you with exceptional heart care, our providers are all part of one team.  This team includes your primary Cardiologist (physician) and Advanced Practice Providers or APPs (Physician Assistants and Nurse Practitioners) who all work together to provide you with the care you need, when you need it.  Your next appointment:   6 month(s)  Provider:   You may see Redell Cave, MD or one of the following Advanced Practice Providers on your designated Care Team:   Lonni Meager, NP Lesley Maffucci, PA-C Bernardino Bring, PA-C [place], PA-C Tylene Lunch, NP Barnie Hila, NP    We recommend signing up for the patient portal called MyChart.  Sign up information is provided on this After Visit Summary.  MyChart is used to connect with patients for Virtual Visits (Telemedicine).  Patients are able to view lab/test results, encounter notes, upcoming appointments, etc.  Non-urgent messages can be sent to your provider as well.   To learn more about what you can do with MyChart, go to [email or web address].
